# Patient Record
Sex: Male | Born: 1950
Health system: Southern US, Community
[De-identification: ages and names within clinical notes are randomized; demographics above are authoritative.]

## PROBLEM LIST (undated history)

## (undated) DIAGNOSIS — I5022 Chronic systolic (congestive) heart failure: Secondary | ICD-10-CM

## (undated) DIAGNOSIS — G4733 Obstructive sleep apnea (adult) (pediatric): Secondary | ICD-10-CM

## (undated) DIAGNOSIS — E7800 Pure hypercholesterolemia, unspecified: Secondary | ICD-10-CM

## (undated) DIAGNOSIS — M1990 Unspecified osteoarthritis, unspecified site: Secondary | ICD-10-CM

## (undated) DIAGNOSIS — E349 Endocrine disorder, unspecified: Secondary | ICD-10-CM

## (undated) DIAGNOSIS — I10 Essential (primary) hypertension: Secondary | ICD-10-CM

## (undated) DIAGNOSIS — I499 Cardiac arrhythmia, unspecified: Secondary | ICD-10-CM

## (undated) DIAGNOSIS — I482 Chronic atrial fibrillation, unspecified: Secondary | ICD-10-CM

## (undated) HISTORY — PX: TONSILLECTOMY: SUR1361

---
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
## 2002-10-31 ENCOUNTER — Ambulatory Visit (HOSPITAL_COMMUNITY): Admission: RE | Admit: 2002-10-31 | Discharge: 2002-10-31 | Payer: Self-pay | Admitting: Gastroenterology

## 2005-09-19 ENCOUNTER — Encounter: Admission: RE | Admit: 2005-09-19 | Discharge: 2005-09-19 | Payer: Self-pay | Admitting: Specialist

## 2016-08-18 ENCOUNTER — Encounter: Payer: Self-pay | Admitting: Cardiovascular Disease

## 2019-03-04 ENCOUNTER — Ambulatory Visit: Payer: Medicare Other | Attending: Internal Medicine

## 2019-03-04 DIAGNOSIS — Z23 Encounter for immunization: Secondary | ICD-10-CM | POA: Insufficient documentation

## 2019-03-04 NOTE — Progress Notes (Signed)
   Covid-19 Vaccination Clinic  Name:  Frank Carpenter    MRN: [id number] DOB: [birth year]  03/04/2019  Mr. Frank Carpenter was observed post Covid-19 immunization for 15 minutes without incidence. He was provided with Vaccine Information Sheet and instruction to access the V-Safe system.   Mr. Frank Carpenter was instructed to call 911 with any severe reactions post vaccine: Marland Kitchen Difficulty breathing  . Swelling of your face and throat  . A fast heartbeat  . A bad rash all over your body  . Dizziness and weakness    Immunizations Administered    Name Date Dose VIS Date Route   Pfizer COVID-19 Vaccine 03/04/2019  4:18 PM 0.3 mL 01/04/2019 Intramuscular   Manufacturer: [employer], [employer]   Lot: [id number]   [place]: [id number]

## 2019-03-29 ENCOUNTER — Ambulatory Visit: Payer: Medicare Other | Attending: Internal Medicine

## 2019-03-29 DIAGNOSIS — Z23 Encounter for immunization: Secondary | ICD-10-CM

## 2019-03-29 NOTE — Progress Notes (Signed)
   Covid-19 Vaccination Clinic  Name:  NICHOLLAS TESTER    MRN: [id number] DOB: 1950/09/20  03/29/2019  Mr. Kalman Drape was observed post Covid-19 immunization for 15 minutes without incident. He was provided with Vaccine Information Sheet and instruction to access the V-Safe system.   Mr. Kalman Drape was instructed to call 911 with any severe reactions post vaccine: Marland Kitchen Difficulty breathing  . Swelling of face and throat  . A fast heartbeat  . A bad rash all over body  . Dizziness and weakness   Immunizations Administered    Name Date Dose VIS Date Route   Pfizer COVID-19 Vaccine 03/29/2019  3:57 PM 0.3 mL 01/04/2019 Intramuscular   Manufacturer: [place]   Lot: [id number]   [place]: [id number]

## 2019-04-03 ENCOUNTER — Encounter: Payer: Self-pay | Admitting: Cardiovascular Disease

## 2019-05-06 ENCOUNTER — Encounter: Payer: Self-pay | Admitting: Cardiovascular Disease

## 2019-05-31 ENCOUNTER — Encounter: Payer: Self-pay | Admitting: Cardiovascular Disease

## 2019-05-31 ENCOUNTER — Other Ambulatory Visit: Payer: Self-pay

## 2019-05-31 ENCOUNTER — Ambulatory Visit: Payer: Medicare Other | Admitting: Cardiovascular Disease

## 2019-05-31 DIAGNOSIS — R0600 Dyspnea, unspecified: Secondary | ICD-10-CM | POA: Diagnosis not present

## 2019-05-31 DIAGNOSIS — I10 Essential (primary) hypertension: Secondary | ICD-10-CM | POA: Diagnosis not present

## 2019-05-31 DIAGNOSIS — E782 Mixed hyperlipidemia: Secondary | ICD-10-CM

## 2019-05-31 DIAGNOSIS — Z720 Tobacco use: Secondary | ICD-10-CM | POA: Diagnosis not present

## 2019-05-31 DIAGNOSIS — R0609 Other forms of dyspnea: Secondary | ICD-10-CM | POA: Insufficient documentation

## 2019-05-31 DIAGNOSIS — E785 Hyperlipidemia, unspecified: Secondary | ICD-10-CM | POA: Insufficient documentation

## 2019-05-31 NOTE — Patient Instructions (Signed)
Medication Instructions:  NO CHANGE *If you need a refill on your cardiac medications before your next appointment, please call your pharmacy*   Lab Work: If you have labs (blood work) drawn today and your tests are completely normal, you will receive your results only by: Marland Kitchen MyChart Message (if you have MyChart) OR . A paper copy in the mail If you have any lab test that is abnormal or we need to change your treatment, we will call you to review the results.   Testing/Procedures: Your physician has requested that you have an echocardiogram. Echocardiography is a painless test that uses sound waves to create images of your heart. It provides your doctor with information about the size and shape of your heart and how well your heart's chambers and valves are working. This procedure takes approximately one hour. There are no restrictions for this procedure.[place]   Follow-Up: At [facility], you and your health needs are our priority.  As part of our continuing mission to provide you with exceptional heart care, we have created designated Provider Care Teams.  These Care Teams include your primary Cardiologist (physician) and Advanced Practice Providers (APPs -  Physician Assistants and Nurse Practitioners) who all work together to provide you with the care you need, when you need it.  We recommend signing up for the patient portal called "MyChart".  Sign up information is provided on this After Visit Summary.  MyChart is used to connect with patients for Virtual Visits (Telemedicine).  Patients are able to view lab/test results, encounter notes, upcoming appointments, etc.  Non-urgent messages can be sent to your provider as well.   To learn more about what you can do with MyChart, go to [email or web address].    Your next appointment:   4-6 week(s)  The format for your next appointment:   In Person  Provider:    Quay Burow, MD

## 2019-05-31 NOTE — Assessment & Plan Note (Signed)
History of hyperlipidemia intolerant to statin drugs

## 2019-05-31 NOTE — Assessment & Plan Note (Signed)
History of remote tobacco abuse having smoked approximate 20 pack years and stopped 5 or 6 years ago.

## 2019-05-31 NOTE — Assessment & Plan Note (Signed)
History of essential hypertension a blood pressure measured today 140/90.  He is on Benicar

## 2019-05-31 NOTE — Assessment & Plan Note (Signed)
5 to 6 months history of progressive dyspnea on exertion.  He denies chest pain.  I am going to get a 2D echocardiogram and coronary calcium score to further evaluate

## 2019-05-31 NOTE — Progress Notes (Signed)
05/31/2019 Frank Carpenter   09-14-1950  [id number]  Primary Physician Marda Stalker, PA-C Primary Cardiologist: Lorretta Harp MD Lupe Carney, [place]  HPI:  Frank Carpenter is a 69 y.o. moderately overweight married Caucasian male father of 28, grandfather 1 grandchild is retired from working in Press photographer and transportation.  He was referred by Marda Stalker, PA-C for evaluation of progressive dyspnea.  His cardiovascular risk factor profile is notable for discontinue tobacco abuse having smoked 20 pack years and stopped 5 to 6 years ago.  He has treated hypertension, untreated hyperlipidemia because of statin intolerance.  There is no family history.  Is never had an attack or stroke.  He denies chest pain but complains of increasing dyspnea exertion over the last 5 to 6 months.   Current Meds  Medication Sig  . methadone (DOLOPHINE) 10 MG tablet Take 10 mg by mouth every 8 (eight) hours.  Marland Kitchen olmesartan (BENICAR) 40 MG tablet Take 40 mg by mouth in the morning and at bedtime. Take half A TABLET IN THE MORNING AND HALF IN THE EVENNING  . testosterone cypionate (DEPOTESTOSTERONE CYPIONATE) 200 MG/ML injection SMARTSIG:0.5 Milliliter(s) IM Once a Week     No Known Allergies  Social History   Socioeconomic History  . Marital status: Married    Spouse name: Not on file  . Number of children: Not on file  . Years of education: Not on file  . Highest education level: Not on file  Occupational History  . Not on file  Tobacco Use  . Smoking status: Not on file  Substance and Sexual Activity  . Alcohol use: Not on file  . Drug use: Not on file  . Sexual activity: Not on file  Other Topics Concern  . Not on file  Social History Narrative  . Not on file   Social Determinants of Health   Financial Resource Strain:   . Difficulty of Paying Living Expenses:   Food Insecurity:   . Worried About Charity fundraiser in the Last Year:   . Arboriculturist in the Last Year:     Transportation Needs:   . Film/video editor (Medical):   Marland Kitchen Lack of Transportation (Non-Medical):   Physical Activity:   . Days of Exercise per Week:   . Minutes of Exercise per Session:   Stress:   . Feeling of Stress :   Social Connections:   . Frequency of Communication with Friends and Family:   . Frequency of Social Gatherings with Friends and Family:   . Attends Religious Services:   . Active Member of Clubs or Organizations:   . Attends Archivist Meetings:   Marland Kitchen Marital Status:   Intimate Partner Violence:   . Fear of Current or Ex-Partner:   . Emotionally Abused:   Marland Kitchen Physically Abused:   . Sexually Abused:      Review of Systems: General: negative for chills, fever, night sweats or weight changes.  Cardiovascular: negative for chest pain, dyspnea on exertion, edema, orthopnea, palpitations, paroxysmal nocturnal dyspnea or shortness of breath Dermatological: negative for rash Respiratory: negative for cough or wheezing Urologic: negative for hematuria Abdominal: negative for nausea, vomiting, diarrhea, bright red blood per rectum, melena, or hematemesis Neurologic: negative for visual changes, syncope, or dizziness All other systems reviewed and are otherwise negative except as noted above.    Blood pressure 140/90, pulse 96, height 5\' 11"  (1.803 m), weight 234 lb (106.1  kg), SpO2 96 %.  General appearance: alert and no distress Neck: no adenopathy, no carotid bruit, no JVD, supple, symmetrical, trachea midline and thyroid not enlarged, symmetric, no tenderness/mass/nodules Lungs: clear to auscultation bilaterally Heart: regular rate and rhythm, S1, S2 normal, no murmur, click, rub or gallop Extremities: extremities normal, atraumatic, no cyanosis or edema Pulses: 2+ and symmetric Skin: Skin color, texture, turgor normal. No rashes or lesions Neurologic: Alert and oriented X 3, normal strength and tone. Normal symmetric reflexes. Normal coordination and  gait  EKG sinus rhythm at 96 without ST or T wave changes.  Personally reviewed this EKG.  ASSESSMENT AND PLAN:   Tobacco abuse History of remote tobacco abuse having smoked approximate 20 pack years and stopped 5 or 6 years ago.  Essential hypertension History of essential hypertension a blood pressure measured today 140/90.  He is on Benicar  Hyperlipidemia History of hyperlipidemia intolerant to statin drugs  Dyspnea on exertion 5 to 6 months history of progressive dyspnea on exertion.  He denies chest pain.  I am going to get a 2D echocardiogram and coronary calcium score to further evaluate      Lorretta Harp MD [facility], [facility] 05/31/2019 3:24 PM

## 2019-06-26 ENCOUNTER — Other Ambulatory Visit (HOSPITAL_COMMUNITY): Payer: Medicare Other

## 2019-06-26 ENCOUNTER — Other Ambulatory Visit: Payer: Self-pay

## 2019-06-26 ENCOUNTER — Ambulatory Visit
Admission: RE | Admit: 2019-06-26 | Discharge: 2019-06-26 | Disposition: A | Discharge status: Home / Self Care | Payer: Self-pay | Source: Ambulatory Visit | Attending: Cardiovascular Disease | Admitting: Cardiovascular Disease

## 2019-06-26 ENCOUNTER — Ambulatory Visit (HOSPITAL_COMMUNITY): Payer: Medicare Other | Attending: Cardiology

## 2019-06-26 DIAGNOSIS — R0609 Other forms of dyspnea: Secondary | ICD-10-CM

## 2019-06-26 DIAGNOSIS — R0600 Dyspnea, unspecified: Secondary | ICD-10-CM | POA: Diagnosis present

## 2019-06-27 ENCOUNTER — Telehealth: Payer: Self-pay

## 2019-06-27 DIAGNOSIS — I712 Thoracic aortic aneurysm, without rupture, unspecified: Secondary | ICD-10-CM

## 2019-06-27 DIAGNOSIS — R0609 Other forms of dyspnea: Secondary | ICD-10-CM

## 2019-06-27 DIAGNOSIS — I77810 Thoracic aortic ectasia: Secondary | ICD-10-CM

## 2019-06-27 NOTE — Telephone Encounter (Signed)
Spoke to patient echo results given.Advised to repeat in 12 months. 

## 2019-06-27 NOTE — Telephone Encounter (Signed)
Spoke to patient coronary calcium score results given.Advised to repeat chest ct in 1 year.

## 2019-06-28 ENCOUNTER — Other Ambulatory Visit: Payer: Self-pay

## 2019-06-28 ENCOUNTER — Encounter: Payer: Self-pay | Admitting: Cardiovascular Disease

## 2019-06-28 ENCOUNTER — Ambulatory Visit: Payer: Medicare Other | Admitting: Cardiovascular Disease

## 2019-06-28 VITALS — BP 132/80 | HR 88 | Ht 71.0 in | Wt 234.0 lb

## 2019-06-28 DIAGNOSIS — R0600 Dyspnea, unspecified: Secondary | ICD-10-CM

## 2019-06-28 DIAGNOSIS — R0609 Other forms of dyspnea: Secondary | ICD-10-CM

## 2019-06-28 NOTE — Progress Notes (Signed)
Mr. Frank Carpenter returns for follow-up of his noninvasive test.  2D echo was essentially normal except for mild dilatation of his thoracic aorta.  Coronary calcium score was 0 but he did have a ascending thoracic aorta measuring 41 mm.  We will follow this on annual basis.  I reassured him that his symptoms of dyspnea are probably not cardiovascular nature.  He was recently diagnosed with Mnire's disease.  Lorretta Harp, M.D., [place], [facility], [place] [address]. [place], [place]  [address]  [phone] 06/28/2019 11:11 AM

## 2019-06-28 NOTE — Patient Instructions (Signed)
Medication Instructions:  Your physician recommends that you continue on your current medications as directed. Please refer to the Current Medication list given to you today.  *If you need a refill on your cardiac medications before your next appointment, please call your pharmacy*   Lab Work: BMET prior to CT in 1 year  If you have labs (blood work) drawn today and your tests are completely normal, you will receive your results only by: Marland Kitchen MyChart Message (if you have MyChart) OR . A paper copy in the mail If you have any lab test that is abnormal or we need to change your treatment, we will call you to review the results.   Testing/Procedures: CT Chest in 1 year   Follow-Up: At [facility], you and your health needs are our priority.  As part of our continuing mission to provide you with exceptional heart care, we have created designated Provider Care Teams.  These Care Teams include your primary Cardiologist (physician) and Advanced Practice Providers (APPs -  Physician Assistants and Nurse Practitioners) who all work together to provide you with the care you need, when you need it.  We recommend signing up for the patient portal called "MyChart".  Sign up information is provided on this After Visit Summary.  MyChart is used to connect with patients for Virtual Visits (Telemedicine).  Patients are able to view lab/test results, encounter notes, upcoming appointments, etc.  Non-urgent messages can be sent to your provider as well.   To learn more about what you can do with MyChart, go to [email or web address].    Your next appointment:   12 month(s)  The format for your next appointment:   In Person  Provider:   You may see Dr. Gwenlyn Found or one of the following Advanced Practice Providers on your designated Care Team:    Kerin Ransom, PA-C  [place], [place]  Coletta Memos, [place]    Other Instructions

## 2019-07-10 ENCOUNTER — Other Ambulatory Visit: Payer: Self-pay | Admitting: Otolaryngology

## 2019-07-10 DIAGNOSIS — R42 Dizziness and giddiness: Secondary | ICD-10-CM

## 2019-07-10 DIAGNOSIS — H9191 Unspecified hearing loss, right ear: Secondary | ICD-10-CM

## 2019-08-08 ENCOUNTER — Telehealth: Payer: Self-pay | Admitting: Cardiovascular Disease

## 2019-08-08 NOTE — Telephone Encounter (Signed)
Returned call to wife-she states they received a letter from [facility] stating the echo was not covered because they did not receive any medical documentation stating the need for it.    Advised this may be the portion of the echo that is not always completed if not needed but would verify with billing/precert.       She also states she was informed the "copay" was $150 for the ct calcium score but insurance is saying it is 110.  Advised this test is not filed through insurance and is 150 out of pocket for everyone.   Advised would send to billing to call and clarify, she verbalized understanding.

## 2019-08-08 NOTE — Telephone Encounter (Signed)
    Pt's wife called, she said the received declined letter for pt's CT. She said the reason is because we did not give insurance the reason for the test. She would like to speak with a nurse to discuss

## 2019-08-15 ENCOUNTER — Other Ambulatory Visit: Payer: Self-pay

## 2019-08-15 ENCOUNTER — Ambulatory Visit
Admission: RE | Admit: 2019-08-15 | Discharge: 2019-08-15 | Disposition: A | Discharge status: Home / Self Care | Payer: Medicare Other | Source: Ambulatory Visit | Attending: Otolaryngology | Admitting: Otolaryngology

## 2019-08-15 DIAGNOSIS — R42 Dizziness and giddiness: Secondary | ICD-10-CM

## 2019-08-15 DIAGNOSIS — H9191 Unspecified hearing loss, right ear: Secondary | ICD-10-CM

## 2019-08-15 MED ORDER — GADOBENATE DIMEGLUMINE 529 MG/ML IV SOLN
20.0000 mL | Freq: Once | INTRAVENOUS | Status: AC | PRN
  Administered 2019-08-15: 20 mL via INTRAVENOUS

## 2020-03-25 DIAGNOSIS — G4733 Obstructive sleep apnea (adult) (pediatric): Secondary | ICD-10-CM | POA: Diagnosis not present

## 2020-03-27 DIAGNOSIS — E785 Hyperlipidemia, unspecified: Secondary | ICD-10-CM | POA: Diagnosis not present

## 2020-03-27 DIAGNOSIS — E7800 Pure hypercholesterolemia, unspecified: Secondary | ICD-10-CM | POA: Diagnosis not present

## 2020-03-27 DIAGNOSIS — I10 Essential (primary) hypertension: Secondary | ICD-10-CM | POA: Diagnosis not present

## 2020-04-01 DIAGNOSIS — G4731 Primary central sleep apnea: Secondary | ICD-10-CM | POA: Diagnosis not present

## 2020-05-07 ENCOUNTER — Encounter (HOSPITAL_BASED_OUTPATIENT_CLINIC_OR_DEPARTMENT_OTHER): Payer: Self-pay

## 2020-05-07 DIAGNOSIS — G4733 Obstructive sleep apnea (adult) (pediatric): Secondary | ICD-10-CM

## 2020-06-09 ENCOUNTER — Ambulatory Visit (HOSPITAL_BASED_OUTPATIENT_CLINIC_OR_DEPARTMENT_OTHER): Payer: Medicare Other | Attending: Internal Medicine | Admitting: Internal Medicine

## 2020-06-09 ENCOUNTER — Other Ambulatory Visit: Payer: Self-pay

## 2020-06-09 DIAGNOSIS — G4733 Obstructive sleep apnea (adult) (pediatric): Secondary | ICD-10-CM | POA: Insufficient documentation

## 2020-06-11 ENCOUNTER — Other Ambulatory Visit: Payer: Self-pay

## 2020-06-11 ENCOUNTER — Other Ambulatory Visit (HOSPITAL_BASED_OUTPATIENT_CLINIC_OR_DEPARTMENT_OTHER): Payer: Self-pay

## 2020-06-11 DIAGNOSIS — G4733 Obstructive sleep apnea (adult) (pediatric): Secondary | ICD-10-CM

## 2020-06-25 ENCOUNTER — Other Ambulatory Visit: Payer: Self-pay

## 2020-06-25 ENCOUNTER — Ambulatory Visit (HOSPITAL_COMMUNITY): Payer: Medicare Other | Attending: Cardiology

## 2020-06-25 DIAGNOSIS — I77810 Thoracic aortic ectasia: Secondary | ICD-10-CM | POA: Insufficient documentation

## 2020-06-25 DIAGNOSIS — R0600 Dyspnea, unspecified: Secondary | ICD-10-CM | POA: Diagnosis not present

## 2020-06-25 DIAGNOSIS — R0609 Other forms of dyspnea: Secondary | ICD-10-CM

## 2020-06-25 LAB — ECHOCARDIOGRAM COMPLETE
Area-P 1/2: 3.21 cm2
S' Lateral: 2.7 cm

## 2020-06-26 ENCOUNTER — Other Ambulatory Visit: Payer: Self-pay

## 2020-06-26 ENCOUNTER — Telehealth: Payer: Self-pay | Admitting: Cardiovascular Disease

## 2020-06-26 ENCOUNTER — Other Ambulatory Visit: Payer: Self-pay | Admitting: *Deleted

## 2020-06-26 DIAGNOSIS — I712 Thoracic aortic aneurysm, without rupture, unspecified: Secondary | ICD-10-CM

## 2020-06-26 NOTE — Telephone Encounter (Signed)
Spoke with patient regarding the Wednesday 08/05/20 11:00 am-CTA chest/aorta appointment at [facility]. [place], [address]---arrival time is 10:45 am for check in--liquids only 4 hours prior to study---patient to come in Friday 07/31/20 or Monday 08/03/20 for lab work.  WIll mail information to patient and he voiced his understanding.

## 2020-07-05 NOTE — Procedures (Signed)
   NAME: Frank Carpenter DATE OF BIRTH:  1950-05-11 MEDICAL RECORD NUMBER [id number]  LOCATION: [facility]  PHYSICIAN: Marius Ditch  DATE OF STUDY: 06/09/2020  SLEEP STUDY TYPE: Positive Airway Pressure Titration               REFERRING PHYSICIAN: Marius Ditch, MD  EPWORTH SLEEPINESS SCORE:  NA HEIGHT: 6' (182.9 cm)  WEIGHT: 230 lb (104.3 kg)    Body mass index is 31.19 kg/m.  NECK SIZE: 17 in.  CLINICAL INFORMATION The patient was referred to the sleep center for CPAP/BPAP titration due to central apnea emerging in the treatment of obstructive sleep apnea. The patient is on chronic opioid therapy.   MEDICATIONS No sleep medicine administered.Marland Kitchen  SLEEP STUDY TECHNIQUE The patient underwent an attended overnight polysomnography titration to assess the effects of cpap therapy. The following variables were monitored: EEG (C4-A1, C3-A2, O1-A2, O2-A1, F3-M2, F4-M1), EOG, submental and leg EMG, ECG, oxyhemoglobin saturation by pulse oximetry, thoracic and abdominal respiratory effort belts, nasal/oral airflow by pressure sensor, body position sensor and snoring sensor. CPAP pressure was titrated to eliminate apneas, hypopneas and oxygen desaturation.  TECHNICAL COMMENTS Comments added by Technician: PATIENT WAS ORDERED AS A CPAP / BIPAP TITRATION. Comments added by Scorer: N/A  SLEEP ARCHITECTURE The study was initiated at 11:04:52 PM and terminated at 5:46:14 AM. Total recorded time was 401.4 minutes. EEG confirmed total sleep time was 293 minutes yielding a sleep efficiency of 73.0%%. Sleep onset after lights out was 8.6 minutes with a REM latency of 279.0 minutes. The patient spent 16.6%% of the night in stage N1 sleep, 81.7%% in stage N2 sleep, 0.0%% in stage N3 and 1.7% in REM. The Arousal Index was 36.0/hour.  RESPIRATORY PARAMETERS The overall AHI was 21.3 per hour, and the RDI was 40.1 events/hour with a central apnea index of 8.8per hour. The most  appropriate setting of BiPAP was IPAP/EPAP 28/24 cm H2O. At this setting, the sleep efficiency was 98 % and the patient was supine for 100%. The AHI was 0 events per hour, and the RDI was 31.3 events/hour at BPAP 28/24. The oxygen nadir was 91.0% during sleep at this setting.   LEG MOVEMENT DATA The total leg movements were 7 with a resulting leg movement index of 1.4. Associated arousal with leg movement index was 0.0.  CARDIAC DATA The underlying cardiac rhythm was most consistent with sinus rhythm. Mean heart rate during sleep was 65.2 bpm. Additional rhythm abnormalities include PVCs.  IMPRESSIONS - Complex sleep apnea. Optimal pressure not obtained.   DIAGNOSIS - Obstructive Sleep Apnea (G47.33) - Complex sleep apnea.   RECOMMENDATIONS - The patient should return for an ASV titration.   Marius Ditch Sleep specialist, [place] Board of Internal Medicine  ELECTRONICALLY SIGNED ON:  07/05/2020, 2:16 PM [place] PH: [phone]   FX: [phone] [place]

## 2020-07-30 ENCOUNTER — Other Ambulatory Visit: Payer: Self-pay

## 2020-07-30 ENCOUNTER — Other Ambulatory Visit: Payer: Medicare Other

## 2020-07-30 DIAGNOSIS — I712 Thoracic aortic aneurysm, without rupture, unspecified: Secondary | ICD-10-CM

## 2020-07-30 LAB — BASIC METABOLIC PANEL
BUN/Creatinine Ratio: 14 (ref 10–24)
BUN: 17 mg/dL (ref 8–27)
CO2: 26 mmol/L (ref 20–29)
Calcium: 9.7 mg/dL (ref 8.6–10.2)
Chloride: 95 mmol/L — ABNORMAL LOW (ref 96–106)
Creatinine, Ser: 1.23 mg/dL (ref 0.76–1.27)
Glucose: 105 mg/dL — ABNORMAL HIGH (ref 65–99)
Potassium: 4.2 mmol/L (ref 3.5–5.2)
Sodium: 137 mmol/L (ref 134–144)
eGFR: 63 mL/min/{1.73_m2} (ref >59)

## 2020-07-31 ENCOUNTER — Other Ambulatory Visit: Payer: Medicare Other

## 2020-08-05 ENCOUNTER — Other Ambulatory Visit: Payer: Self-pay

## 2020-08-05 ENCOUNTER — Ambulatory Visit (INDEPENDENT_AMBULATORY_CARE_PROVIDER_SITE_OTHER)
Admission: RE | Admit: 2020-08-05 | Discharge: 2020-08-05 | Disposition: A | Discharge status: Home / Self Care | Payer: Medicare Other | Source: Ambulatory Visit | Attending: Cardiovascular Disease | Admitting: Cardiovascular Disease

## 2020-08-05 ENCOUNTER — Other Ambulatory Visit: Payer: Medicare Other

## 2020-08-05 DIAGNOSIS — I712 Thoracic aortic aneurysm, without rupture, unspecified: Secondary | ICD-10-CM

## 2020-08-05 DIAGNOSIS — N281 Cyst of kidney, acquired: Secondary | ICD-10-CM | POA: Diagnosis not present

## 2020-08-05 MED ORDER — IOHEXOL 350 MG/ML SOLN
100.0000 mL | Freq: Once | INTRAVENOUS | Status: AC | PRN
  Administered 2020-08-05: 100 mL via INTRAVENOUS

## 2020-08-06 ENCOUNTER — Encounter: Payer: Self-pay | Admitting: *Deleted

## 2020-08-17 ENCOUNTER — Other Ambulatory Visit: Payer: Self-pay

## 2020-08-17 DIAGNOSIS — I712 Thoracic aortic aneurysm, without rupture, unspecified: Secondary | ICD-10-CM

## 2020-10-13 DIAGNOSIS — Z23 Encounter for immunization: Secondary | ICD-10-CM | POA: Diagnosis not present

## 2020-12-10 DIAGNOSIS — K5909 Other constipation: Secondary | ICD-10-CM | POA: Diagnosis not present

## 2020-12-22 DIAGNOSIS — E7800 Pure hypercholesterolemia, unspecified: Secondary | ICD-10-CM | POA: Diagnosis not present

## 2020-12-22 DIAGNOSIS — E785 Hyperlipidemia, unspecified: Secondary | ICD-10-CM | POA: Diagnosis not present

## 2020-12-22 DIAGNOSIS — I10 Essential (primary) hypertension: Secondary | ICD-10-CM | POA: Diagnosis not present

## 2020-12-24 DIAGNOSIS — Z8709 Personal history of other diseases of the respiratory system: Secondary | ICD-10-CM | POA: Diagnosis not present

## 2020-12-24 DIAGNOSIS — I10 Essential (primary) hypertension: Secondary | ICD-10-CM | POA: Diagnosis not present

## 2020-12-24 DIAGNOSIS — Z23 Encounter for immunization: Secondary | ICD-10-CM | POA: Diagnosis not present

## 2020-12-24 DIAGNOSIS — Z0000 Encounter for general adult medical examination without abnormal findings: Secondary | ICD-10-CM | POA: Diagnosis not present

## 2020-12-24 DIAGNOSIS — E7800 Pure hypercholesterolemia, unspecified: Secondary | ICD-10-CM | POA: Diagnosis not present

## 2020-12-24 DIAGNOSIS — R7303 Prediabetes: Secondary | ICD-10-CM | POA: Diagnosis not present

## 2020-12-30 DIAGNOSIS — E7800 Pure hypercholesterolemia, unspecified: Secondary | ICD-10-CM | POA: Diagnosis not present

## 2020-12-30 DIAGNOSIS — I10 Essential (primary) hypertension: Secondary | ICD-10-CM | POA: Diagnosis not present

## 2021-02-11 DIAGNOSIS — I10 Essential (primary) hypertension: Secondary | ICD-10-CM | POA: Diagnosis not present

## 2021-02-11 DIAGNOSIS — E7800 Pure hypercholesterolemia, unspecified: Secondary | ICD-10-CM | POA: Diagnosis not present

## 2021-03-15 DIAGNOSIS — E7800 Pure hypercholesterolemia, unspecified: Secondary | ICD-10-CM | POA: Diagnosis not present

## 2021-03-15 DIAGNOSIS — I10 Essential (primary) hypertension: Secondary | ICD-10-CM | POA: Diagnosis not present

## 2021-03-15 DIAGNOSIS — E785 Hyperlipidemia, unspecified: Secondary | ICD-10-CM | POA: Diagnosis not present

## 2021-03-17 DIAGNOSIS — D128 Benign neoplasm of rectum: Secondary | ICD-10-CM | POA: Diagnosis not present

## 2021-03-17 DIAGNOSIS — Z86010 Personal history of colonic polyps: Secondary | ICD-10-CM | POA: Diagnosis not present

## 2021-03-17 DIAGNOSIS — D122 Benign neoplasm of ascending colon: Secondary | ICD-10-CM | POA: Diagnosis not present

## 2021-03-17 DIAGNOSIS — K5939 Other megacolon: Secondary | ICD-10-CM | POA: Diagnosis not present

## 2021-03-17 DIAGNOSIS — D124 Benign neoplasm of descending colon: Secondary | ICD-10-CM | POA: Diagnosis not present

## 2021-03-17 DIAGNOSIS — D123 Benign neoplasm of transverse colon: Secondary | ICD-10-CM | POA: Diagnosis not present

## 2021-03-17 DIAGNOSIS — K5730 Diverticulosis of large intestine without perforation or abscess without bleeding: Secondary | ICD-10-CM | POA: Diagnosis not present

## 2021-03-17 DIAGNOSIS — K648 Other hemorrhoids: Secondary | ICD-10-CM | POA: Diagnosis not present

## 2021-03-19 DIAGNOSIS — D128 Benign neoplasm of rectum: Secondary | ICD-10-CM | POA: Diagnosis not present

## 2021-03-19 DIAGNOSIS — D124 Benign neoplasm of descending colon: Secondary | ICD-10-CM | POA: Diagnosis not present

## 2021-03-19 DIAGNOSIS — D123 Benign neoplasm of transverse colon: Secondary | ICD-10-CM | POA: Diagnosis not present

## 2021-03-19 DIAGNOSIS — D122 Benign neoplasm of ascending colon: Secondary | ICD-10-CM | POA: Diagnosis not present

## 2021-04-24 HISTORY — PX: NASAL SEPTUM SURGERY: SHX37

## 2021-04-27 DIAGNOSIS — E7800 Pure hypercholesterolemia, unspecified: Secondary | ICD-10-CM | POA: Diagnosis not present

## 2021-04-27 DIAGNOSIS — T485X5A Adverse effect of other anti-common-cold drugs, initial encounter: Secondary | ICD-10-CM | POA: Diagnosis not present

## 2021-04-27 DIAGNOSIS — G4733 Obstructive sleep apnea (adult) (pediatric): Secondary | ICD-10-CM | POA: Diagnosis not present

## 2021-04-27 DIAGNOSIS — J310 Chronic rhinitis: Secondary | ICD-10-CM | POA: Diagnosis not present

## 2021-04-27 DIAGNOSIS — K219 Gastro-esophageal reflux disease without esophagitis: Secondary | ICD-10-CM | POA: Diagnosis not present

## 2021-04-27 DIAGNOSIS — H6981 Other specified disorders of Eustachian tube, right ear: Secondary | ICD-10-CM | POA: Diagnosis not present

## 2021-04-27 DIAGNOSIS — J342 Deviated nasal septum: Secondary | ICD-10-CM | POA: Diagnosis not present

## 2021-04-28 DIAGNOSIS — J22 Unspecified acute lower respiratory infection: Secondary | ICD-10-CM | POA: Diagnosis not present

## 2021-05-10 DIAGNOSIS — G4733 Obstructive sleep apnea (adult) (pediatric): Secondary | ICD-10-CM | POA: Diagnosis not present

## 2021-05-10 DIAGNOSIS — G4731 Primary central sleep apnea: Secondary | ICD-10-CM | POA: Diagnosis not present

## 2021-05-13 DIAGNOSIS — R899 Unspecified abnormal finding in specimens from other organs, systems and tissues: Secondary | ICD-10-CM | POA: Diagnosis not present

## 2021-05-26 DIAGNOSIS — J328 Other chronic sinusitis: Secondary | ICD-10-CM | POA: Diagnosis not present

## 2021-05-26 DIAGNOSIS — I10 Essential (primary) hypertension: Secondary | ICD-10-CM | POA: Diagnosis not present

## 2021-06-08 ENCOUNTER — Ambulatory Visit (HOSPITAL_COMMUNITY): Payer: Medicare Other | Attending: Internal Medicine

## 2021-06-11 ENCOUNTER — Ambulatory Visit: Payer: Medicare Other | Admitting: Cardiovascular Disease

## 2021-06-24 DIAGNOSIS — J342 Deviated nasal septum: Secondary | ICD-10-CM | POA: Diagnosis not present

## 2021-06-24 DIAGNOSIS — J343 Hypertrophy of nasal turbinates: Secondary | ICD-10-CM | POA: Diagnosis not present

## 2021-06-24 DIAGNOSIS — K219 Gastro-esophageal reflux disease without esophagitis: Secondary | ICD-10-CM | POA: Diagnosis not present

## 2021-06-25 ENCOUNTER — Ambulatory Visit (HOSPITAL_COMMUNITY): Payer: Medicare Other | Attending: Cardiology

## 2021-06-25 DIAGNOSIS — I712 Thoracic aortic aneurysm, without rupture, unspecified: Secondary | ICD-10-CM | POA: Diagnosis not present

## 2021-06-25 LAB — ECHOCARDIOGRAM COMPLETE
Area-P 1/2: 3.11 cm2
S' Lateral: 3.1 cm

## 2021-06-30 ENCOUNTER — Encounter: Payer: Self-pay | Admitting: Cardiovascular Disease

## 2021-06-30 ENCOUNTER — Ambulatory Visit (INDEPENDENT_AMBULATORY_CARE_PROVIDER_SITE_OTHER): Payer: Medicare Other | Admitting: Cardiovascular Disease

## 2021-06-30 DIAGNOSIS — E782 Mixed hyperlipidemia: Secondary | ICD-10-CM

## 2021-06-30 DIAGNOSIS — I7121 Aneurysm of the ascending aorta, without rupture: Secondary | ICD-10-CM

## 2021-06-30 DIAGNOSIS — I10 Essential (primary) hypertension: Secondary | ICD-10-CM | POA: Diagnosis not present

## 2021-06-30 DIAGNOSIS — R0609 Other forms of dyspnea: Secondary | ICD-10-CM

## 2021-06-30 DIAGNOSIS — I712 Thoracic aortic aneurysm, without rupture, unspecified: Secondary | ICD-10-CM | POA: Insufficient documentation

## 2021-06-30 NOTE — Assessment & Plan Note (Signed)
History of hyperlipidemia on Zetia with lipid profile performed//23 revealing total cholesterol 47, LDL 93 and HDL 37.

## 2021-06-30 NOTE — Assessment & Plan Note (Signed)
Small thoracic aortic aneurysm measuring 42 mm by 2D echo recently performed 06/25/2021.  This will be repeated every other year.

## 2021-06-30 NOTE — Progress Notes (Signed)
06/30/2021 Frank Carpenter   [birth year]  [id number]  Primary Physician Marda Stalker, PA-C Primary Cardiologist: Lorretta Harp MD Lupe Carney, [place]  HPI:  Frank Carpenter is a 71 y.o. moderately overweight married Caucasian male father of 4, grandfather 1 grandchild is retired from working in Press photographer and transportation.  He was referred by Marda Stalker, PA-C for evaluation of progressive dyspnea.  I last saw him in the office 05/31/2019.  His cardiovascular risk factor profile is notable for discontinue tobacco abuse having smoked 20 pack years and stopped 5 to 6 years ago.  He has treated hypertension, untreated hyperlipidemia because of statin intolerance.  There is no family history.  Is never had an attack or stroke.  He denies chest pain but complains of increasing dyspnea exertion over the last 5 to 6 months.  Since I saw him 2 years ago he continues to do well.  I got a coronary calcium score on him 06/26/2019 which was 0 and a 2D echocardiogram 06/25/2021 which showed normal LV systolic function, grade 1 diastolic dysfunction and a small ascending thoracic aortic aneurysm measuring 42 mm.  He had no valvular abnormalities.   Current Meds  Medication Sig   [employer] 12 HOUR NASAL DECONGESTANT 120 MG 12 hr tablet Take 120 mg by mouth as needed.   ezetimibe (ZETIA) 10 MG tablet Take 10 mg by mouth daily.   fluticasone (FLONASE) 50 MCG/ACT nasal spray Place 1 spray into both nostrils as needed.   hydrOXYzine (ATARAX) 25 MG tablet Take 25 mg by mouth every 8 (eight) hours as needed.   methadone (DOLOPHINE) 10 MG tablet Take 10 mg by mouth every 8 (eight) hours.   Olmesartan-amLODIPine-HCTZ 40-5-12.5 MG TABS Take 1 tablet by mouth daily.   omeprazole (PRILOSEC) 40 MG capsule Take by mouth as needed.   testosterone cypionate (DEPOTESTOSTERONE CYPIONATE) 200 MG/ML injection SMARTSIG:0.5 Milliliter(s) IM Once a Week     Allergies  Allergen Reactions   Statins     Other reaction(s):  Other (See Comments) Fatigue    Social History   Socioeconomic History   Marital status: Married    Spouse name: Not on file   Number of children: Not on file   Years of education: Not on file   Highest education level: Not on file  Occupational History   Not on file  Tobacco Use   Smoking status: Never   Smokeless tobacco: Never  Substance and Sexual Activity   Alcohol use: Yes    Alcohol/week: 2.0 standard drinks    Types: 2 Cans of beer per week   Drug use: Yes    Types: Hydrocodone   Sexual activity: Not on file  Other Topics Concern   Not on file  Social History Narrative   Not on file   Social Determinants of Health   Financial Resource Strain: Not on file  Food Insecurity: Not on file  Transportation Needs: Not on file  Physical Activity: Not on file  Stress: Not on file  Social Connections: Not on file  Intimate Partner Violence: Not on file     Review of Systems: General: negative for chills, fever, night sweats or weight changes.  Cardiovascular: negative for chest pain, dyspnea on exertion, edema, orthopnea, palpitations, paroxysmal nocturnal dyspnea or shortness of breath Dermatological: negative for rash Respiratory: negative for cough or wheezing Urologic: negative for hematuria Abdominal: negative for nausea, vomiting, diarrhea, bright red blood per rectum, melena, or hematemesis Neurologic: negative for visual  changes, syncope, or dizziness All other systems reviewed and are otherwise negative except as noted above.    Blood pressure 124/75, pulse 90, height '5\' 11"'$  (1.803 m), weight 231 lb 9.6 oz (105.1 kg), SpO2 97 %.  General appearance: alert and no distress Neck: no adenopathy, no carotid bruit, no JVD, supple, symmetrical, trachea midline, and thyroid not enlarged, symmetric, no tenderness/mass/nodules Lungs: clear to auscultation bilaterally Heart: regular rate and rhythm, S1, S2 normal, no murmur, click, rub or gallop Extremities:  extremities normal, atraumatic, no cyanosis or edema Pulses: 2+ and symmetric Skin: Skin color, texture, turgor normal. No rashes or lesions Neurologic: Grossly normal  EKG sinus rhythm at 90 with occasional PVCs and nonspecific ST and T wave changes.  I personally reviewed this EKG.  ASSESSMENT AND PLAN:   Essential hypertension History of essential hypertension a blood pressure measured today at 124/75.  He is on olmesartan and amlodipine as well as hydrochlorothiazide.  Hyperlipidemia History of hyperlipidemia on Zetia with lipid profile performed//23 revealing total cholesterol 47, LDL 93 and HDL 37.  Dyspnea on exertion History of dyspnea on exertion probably primarily pulmonary in origin from prior tobacco abuse.  He did have a coronary calcium score which was 0 and a 2D echo which was essentially normal as well.  Thoracic aortic aneurysm (HCC) Small thoracic aortic aneurysm measuring 42 mm by 2D echo recently performed 06/25/2021.  This will be repeated every other year.     Lorretta Harp MD FACP,FACC,FAHA, [facility] 06/30/2021 11:57 AM

## 2021-06-30 NOTE — Assessment & Plan Note (Signed)
History of dyspnea on exertion probably primarily pulmonary in origin from prior tobacco abuse.  He did have a coronary calcium score which was 0 and a 2D echo which was essentially normal as well.

## 2021-06-30 NOTE — Assessment & Plan Note (Signed)
History of essential hypertension a blood pressure measured today at 124/75.  He is on olmesartan and amlodipine as well as hydrochlorothiazide.

## 2021-06-30 NOTE — Patient Instructions (Signed)
Medication Instructions:  Your physician recommends that you continue on your current medications as directed. Please refer to the Current Medication list given to you today.  *If you need a refill on your cardiac medications before your next appointment, please call your pharmacy*   Testing/Procedures: Your physician has requested that you have an echocardiogram. Echocardiography is a painless test that uses sound waves to create images of your heart. It provides your doctor with information about the size and shape of your heart and how well your heart's chambers and valves are working. This procedure takes approximately one hour. There are no restrictions for this procedure. To be done in June 2025.     Follow-Up: At [facility], you and your health needs are our priority.  As part of our continuing mission to provide you with exceptional heart care, we have created designated Provider Care Teams.  These Care Teams include your primary Cardiologist (physician) and Advanced Practice Providers (APPs -  Physician Assistants and Nurse Practitioners) who all work together to provide you with the care you need, when you need it.  We recommend signing up for the patient portal called "MyChart".  Sign up information is provided on this After Visit Summary.  MyChart is used to connect with patients for Virtual Visits (Telemedicine).  Patients are able to view lab/test results, encounter notes, upcoming appointments, etc.  Non-urgent messages can be sent to your provider as well.   To learn more about what you can do with MyChart, go to [email or web address].    Your next appointment:   2 year(s)  The format for your next appointment:   In Person  Provider:   Quay Burow, MD

## 2021-07-20 DIAGNOSIS — J3489 Other specified disorders of nose and nasal sinuses: Secondary | ICD-10-CM | POA: Diagnosis not present

## 2021-07-20 DIAGNOSIS — J343 Hypertrophy of nasal turbinates: Secondary | ICD-10-CM | POA: Diagnosis not present

## 2021-10-15 ENCOUNTER — Other Ambulatory Visit: Payer: Self-pay | Admitting: Family Medicine

## 2021-10-15 DIAGNOSIS — M545 Low back pain, unspecified: Secondary | ICD-10-CM

## 2021-10-21 DIAGNOSIS — M5459 Other low back pain: Secondary | ICD-10-CM | POA: Diagnosis not present

## 2021-10-21 DIAGNOSIS — I10 Essential (primary) hypertension: Secondary | ICD-10-CM | POA: Diagnosis not present

## 2021-11-03 ENCOUNTER — Ambulatory Visit
Admission: RE | Admit: 2021-11-03 | Discharge: 2021-11-03 | Disposition: A | Discharge status: Home / Self Care | Payer: Medicare Other | Source: Ambulatory Visit | Attending: Family Medicine | Admitting: Family Medicine

## 2021-11-03 DIAGNOSIS — M4317 Spondylolisthesis, lumbosacral region: Secondary | ICD-10-CM | POA: Diagnosis not present

## 2021-11-03 DIAGNOSIS — M545 Low back pain, unspecified: Secondary | ICD-10-CM | POA: Diagnosis not present

## 2021-11-03 DIAGNOSIS — M48061 Spinal stenosis, lumbar region without neurogenic claudication: Secondary | ICD-10-CM | POA: Diagnosis not present

## 2021-11-03 DIAGNOSIS — M4126 Other idiopathic scoliosis, lumbar region: Secondary | ICD-10-CM | POA: Diagnosis not present

## 2021-11-03 DIAGNOSIS — M79604 Pain in right leg: Secondary | ICD-10-CM | POA: Diagnosis not present

## 2021-11-03 DIAGNOSIS — M4316 Spondylolisthesis, lumbar region: Secondary | ICD-10-CM | POA: Diagnosis not present

## 2021-12-03 DIAGNOSIS — E7800 Pure hypercholesterolemia, unspecified: Secondary | ICD-10-CM | POA: Diagnosis not present

## 2021-12-03 DIAGNOSIS — R7303 Prediabetes: Secondary | ICD-10-CM | POA: Diagnosis not present

## 2022-01-05 DIAGNOSIS — Z0000 Encounter for general adult medical examination without abnormal findings: Secondary | ICD-10-CM | POA: Diagnosis not present

## 2022-01-10 DIAGNOSIS — I4891 Unspecified atrial fibrillation: Secondary | ICD-10-CM | POA: Diagnosis not present

## 2022-01-10 DIAGNOSIS — R079 Chest pain, unspecified: Secondary | ICD-10-CM | POA: Diagnosis not present

## 2022-01-11 ENCOUNTER — Other Ambulatory Visit: Payer: Self-pay | Admitting: Sports Medicine

## 2022-01-11 DIAGNOSIS — M25512 Pain in left shoulder: Secondary | ICD-10-CM | POA: Diagnosis not present

## 2022-01-13 DIAGNOSIS — M25512 Pain in left shoulder: Secondary | ICD-10-CM | POA: Diagnosis not present

## 2022-01-13 DIAGNOSIS — M6281 Muscle weakness (generalized): Secondary | ICD-10-CM | POA: Diagnosis not present

## 2022-01-13 DIAGNOSIS — M7542 Impingement syndrome of left shoulder: Secondary | ICD-10-CM | POA: Diagnosis not present

## 2022-01-13 DIAGNOSIS — R293 Abnormal posture: Secondary | ICD-10-CM | POA: Diagnosis not present

## 2022-01-14 ENCOUNTER — Telehealth: Payer: Self-pay | Admitting: Cardiovascular Disease

## 2022-01-14 ENCOUNTER — Emergency Department (HOSPITAL_BASED_OUTPATIENT_CLINIC_OR_DEPARTMENT_OTHER): Payer: Medicare Other

## 2022-01-14 ENCOUNTER — Other Ambulatory Visit: Payer: Self-pay

## 2022-01-14 ENCOUNTER — Ambulatory Visit
Admission: RE | Admit: 2022-01-14 | Discharge: 2022-01-14 | Disposition: A | Discharge status: Home / Self Care | Payer: Medicare Other | Source: Ambulatory Visit | Attending: Sports Medicine | Admitting: Sports Medicine

## 2022-01-14 ENCOUNTER — Encounter (HOSPITAL_BASED_OUTPATIENT_CLINIC_OR_DEPARTMENT_OTHER): Payer: Self-pay

## 2022-01-14 ENCOUNTER — Inpatient Hospital Stay (HOSPITAL_BASED_OUTPATIENT_CLINIC_OR_DEPARTMENT_OTHER)
Admission: EM | Admit: 2022-01-14 | Discharge: 2022-01-17 | DRG: 309 | Disposition: A | Discharge status: Home / Self Care | Payer: Medicare Other | Attending: Family Medicine | Admitting: Family Medicine

## 2022-01-14 DIAGNOSIS — I714 Abdominal aortic aneurysm, without rupture, unspecified: Secondary | ICD-10-CM | POA: Diagnosis present

## 2022-01-14 DIAGNOSIS — Z888 Allergy status to other drugs, medicaments and biological substances status: Secondary | ICD-10-CM

## 2022-01-14 DIAGNOSIS — Z79899 Other long term (current) drug therapy: Secondary | ICD-10-CM

## 2022-01-14 DIAGNOSIS — I712 Thoracic aortic aneurysm, without rupture, unspecified: Secondary | ICD-10-CM | POA: Diagnosis not present

## 2022-01-14 DIAGNOSIS — E291 Testicular hypofunction: Secondary | ICD-10-CM | POA: Diagnosis present

## 2022-01-14 DIAGNOSIS — Z1152 Encounter for screening for COVID-19: Secondary | ICD-10-CM

## 2022-01-14 DIAGNOSIS — I4891 Unspecified atrial fibrillation: Secondary | ICD-10-CM | POA: Diagnosis not present

## 2022-01-14 DIAGNOSIS — M25512 Pain in left shoulder: Secondary | ICD-10-CM

## 2022-01-14 DIAGNOSIS — S2231XA Fracture of one rib, right side, initial encounter for closed fracture: Secondary | ICD-10-CM | POA: Diagnosis not present

## 2022-01-14 DIAGNOSIS — G8929 Other chronic pain: Secondary | ICD-10-CM | POA: Diagnosis not present

## 2022-01-14 DIAGNOSIS — I5032 Chronic diastolic (congestive) heart failure: Secondary | ICD-10-CM | POA: Diagnosis not present

## 2022-01-14 DIAGNOSIS — I110 Hypertensive heart disease with heart failure: Secondary | ICD-10-CM | POA: Diagnosis not present

## 2022-01-14 DIAGNOSIS — I10 Essential (primary) hypertension: Secondary | ICD-10-CM | POA: Diagnosis present

## 2022-01-14 DIAGNOSIS — Z6832 Body mass index (BMI) 32.0-32.9, adult: Secondary | ICD-10-CM | POA: Diagnosis not present

## 2022-01-14 DIAGNOSIS — G4733 Obstructive sleep apnea (adult) (pediatric): Secondary | ICD-10-CM | POA: Diagnosis not present

## 2022-01-14 DIAGNOSIS — I480 Paroxysmal atrial fibrillation: Secondary | ICD-10-CM | POA: Diagnosis not present

## 2022-01-14 DIAGNOSIS — Z79890 Hormone replacement therapy (postmenopausal): Secondary | ICD-10-CM

## 2022-01-14 DIAGNOSIS — M19012 Primary osteoarthritis, left shoulder: Secondary | ICD-10-CM | POA: Diagnosis not present

## 2022-01-14 DIAGNOSIS — R0602 Shortness of breath: Secondary | ICD-10-CM | POA: Diagnosis not present

## 2022-01-14 DIAGNOSIS — Z8249 Family history of ischemic heart disease and other diseases of the circulatory system: Secondary | ICD-10-CM | POA: Diagnosis not present

## 2022-01-14 DIAGNOSIS — Z8052 Family history of malignant neoplasm of bladder: Secondary | ICD-10-CM | POA: Diagnosis not present

## 2022-01-14 DIAGNOSIS — I7121 Aneurysm of the ascending aorta, without rupture: Secondary | ICD-10-CM | POA: Diagnosis not present

## 2022-01-14 DIAGNOSIS — D72829 Elevated white blood cell count, unspecified: Secondary | ICD-10-CM | POA: Diagnosis present

## 2022-01-14 DIAGNOSIS — E7800 Pure hypercholesterolemia, unspecified: Secondary | ICD-10-CM | POA: Diagnosis not present

## 2022-01-14 DIAGNOSIS — E669 Obesity, unspecified: Secondary | ICD-10-CM | POA: Diagnosis present

## 2022-01-14 DIAGNOSIS — R079 Chest pain, unspecified: Secondary | ICD-10-CM | POA: Diagnosis not present

## 2022-01-14 DIAGNOSIS — J9811 Atelectasis: Secondary | ICD-10-CM | POA: Diagnosis not present

## 2022-01-14 HISTORY — DX: Endocrine disorder, unspecified: E34.9

## 2022-01-14 HISTORY — DX: Pure hypercholesterolemia, unspecified: E78.00

## 2022-01-14 LAB — CBC
HCT: 42.6 % (ref 39.0–52.0)
Hemoglobin: 14.7 g/dL (ref 13.0–17.0)
MCH: 32 pg (ref 26.0–34.0)
MCHC: 34.5 g/dL (ref 30.0–36.0)
MCV: 92.8 fL (ref 80.0–100.0)
Platelets: 290 10*3/uL (ref 150–400)
RBC: 4.59 MIL/uL (ref 4.22–5.81)
RDW: 12.5 % (ref 11.5–15.5)
WBC: 12.6 10*3/uL — ABNORMAL HIGH (ref 4.0–10.5)
nRBC: 0 % (ref 0.0–0.2)

## 2022-01-14 LAB — BASIC METABOLIC PANEL
Anion gap: 11 (ref 5–15)
BUN: 24 mg/dL — ABNORMAL HIGH (ref 8–23)
CO2: 25 mmol/L (ref 22–32)
Calcium: 9.2 mg/dL (ref 8.9–10.3)
Chloride: 99 mmol/L (ref 98–111)
Creatinine, Ser: 1.01 mg/dL (ref 0.61–1.24)
GFR, Estimated: >60 mL/min (ref >60)
Glucose, Bld: 102 mg/dL — ABNORMAL HIGH (ref 70–99)
Potassium: 4 mmol/L (ref 3.5–5.1)
Sodium: 135 mmol/L (ref 135–145)

## 2022-01-14 LAB — TROPONIN I (HIGH SENSITIVITY): Troponin I (High Sensitivity): 6 ng/L (ref <18)

## 2022-01-14 MED ORDER — DILTIAZEM LOAD VIA INFUSION
10.0000 mg | Freq: Once | INTRAVENOUS | Status: AC
  Administered 2022-01-14: 10 mg via INTRAVENOUS
  Filled 2022-01-14: qty 10

## 2022-01-14 MED ORDER — IOHEXOL 350 MG/ML SOLN
90.0000 mL | Freq: Once | INTRAVENOUS | Status: AC | PRN
  Administered 2022-01-14: 90 mL via INTRAVENOUS

## 2022-01-14 MED ORDER — DILTIAZEM HCL-DEXTROSE 125-5 MG/125ML-% IV SOLN (PREMIX)
5.0000 mg/h | INTRAVENOUS | Status: DC
  Administered 2022-01-14: 5 mg/h via INTRAVENOUS
  Administered 2022-01-15 – 2022-01-16 (×2): 7.5 mg/h via INTRAVENOUS
  Administered 2022-01-16: 5 mg/h via INTRAVENOUS
  Filled 2022-01-14 (×4): qty 125

## 2022-01-14 NOTE — Telephone Encounter (Signed)
Patient called and mentioned that doctor told him that he was in afib. Did not know that he was in afib. Wants to talk with Dr. Gwenlyn Found or nurse to see what they think and what to do next

## 2022-01-14 NOTE — Telephone Encounter (Signed)
Spoke with patient and informed him of C. Walker's recommendation for him to go to [facility] or [facility] ED to be evaluated and treated if in afib. Patient stated he will go. The urgent care he went to was [facility] on [address].

## 2022-01-14 NOTE — ED Triage Notes (Addendum)
Pt presents with a CP that started on 12/18 with radiation to the L shoulder. Pt has some associated ShOB and nausea. Pt states the pain in the chest eased off and now he only hurts in his shoulder. Pt states the pain is worse with inspiration. Pt also reports that he has had some R leg pain and swelling. Pt is not on anticoagulants.   Pt showing a-fib on the EKG. Pt denies hx of previous a-fib.

## 2022-01-14 NOTE — Telephone Encounter (Signed)
See other note

## 2022-01-14 NOTE — Telephone Encounter (Signed)
Patient stated he went to urgent care on Monday 12/18 for having chest discomfort with a deep breath. He was DX with afib and was offered 911. His friend took him to [facility], but there were 65 patients,so he left. Recommended that if he becomes SOB and lightheaded to call 911. Gave him triggers to avoid: caffeine, ETOH, chocolate, dehydration. He verbalized understanding of this conversation. Appointment made with Vella Raring on 12/27. He will bring EKG and paperwork from 12/18.

## 2022-01-14 NOTE — ED Provider Notes (Signed)
[place] EMERGENCY DEPT  Provider Note  CSN: [id number] Arrival date & time: 01/14/22 2223  History Chief Complaint  Patient presents with  . Chest Pain    Frank Carpenter is a 71 y.o. male with history of mildly dilated thoracic aorta on echo in 2021, HTN and HLD reports he woke up Monday (12/18) with sharp chest pains, worse with deep breath and some SOB. He went to the [facility] clinic that day and was found to be in new-onset Afib, he was directed to the ED but after getting to [facility] he saw the number of people in the waiting room and decided to go home. He reports the chest pain has since resolved, although he continues to have some L shoulder pain which pre-dated his other symptoms and was diagnosed as a 'tear' by Sports Medicine. He reports he continues to feel weak/tired which has gotten worse as the week has gone on. He has not had any cough or fever. He noticed some puffiness in his ankles and R calf discomfort which is unusual for him.    Home Medications Prior to Admission medications   Medication Sig Start Date End Date Taking? Authorizing Provider  [facility]S 12 HOUR NASAL DECONGESTANT 120 MG 12 hr tablet Take 120 mg by mouth as needed. 06/24/21   [provider]  ezetimibe (ZETIA) 10 MG tablet Take 10 mg by mouth daily. 03/18/21   [provider]  fluticasone (FLONASE) 50 MCG/ACT nasal spray Place 1 spray into both nostrils as needed. 02/23/21   [provider]  hydrOXYzine (ATARAX) 25 MG tablet Take 25 mg by mouth every 8 (eight) hours as needed. 04/27/21   [provider]  methadone (DOLOPHINE) 10 MG tablet Take 10 mg by mouth every 8 (eight) hours.    [provider]  olmesartan (BENICAR) 40 MG tablet Take 40 mg by mouth in the morning and at bedtime. Take half A TABLET IN THE MORNING AND HALF IN THE [place] Patient not taking: Reported on 06/30/2021    [provider]  Olmesartan-amLODIPine-HCTZ 40-5-12.5 MG  TABS Take 1 tablet by mouth daily. 05/26/21   [provider]  omeprazole (PRILOSEC) 40 MG capsule Take by mouth as needed. 06/24/21 07/24/21  [provider]  testosterone cypionate (DEPOTESTOSTERONE CYPIONATE) 200 MG/ML injection SMARTSIG:0.5 Milliliter(s) IM Once a Week 04/22/19   [provider]     Allergies    Statins   Review of Systems   Review of Systems Please see HPI for pertinent positives and negatives  Physical Exam BP 114/78   Pulse 92   Resp 18   Ht '5\' 10"'$  (1.778 m)   Wt 107.5 kg   SpO2 90%   BMI 34.01 kg/m   Physical Exam Vitals and nursing note reviewed.  Constitutional:      Appearance: Normal appearance.  HENT:     Head: Normocephalic and atraumatic.     Nose: Nose normal.     Mouth/Throat:     Mouth: Mucous membranes are moist.  Eyes:     Extraocular Movements: Extraocular movements intact.     Conjunctiva/sclera: Conjunctivae normal.  Cardiovascular:     Rate and Rhythm: Tachycardia present. Rhythm irregular.  Pulmonary:     Effort: Pulmonary effort is normal.     Breath sounds: Normal breath sounds.  Abdominal:     General: Abdomen is flat.     Palpations: Abdomen is soft.     Tenderness: There is no abdominal tenderness.  Musculoskeletal:        General: No swelling. Normal range of motion.     Cervical back: Neck supple.  Skin:    General: Skin is warm and dry.  Neurological:     General: No focal deficit present.     Mental Status: He is alert.  Psychiatric:        Mood and Affect: Mood normal.     ED Results / Procedures / Treatments   EKG EKG Interpretation  Date/Time:  Friday January 14 2022 22:39:25 EST Ventricular Rate:  128 PR Interval:    QRS Duration: 94 QT Interval:  310 QTC Calculation: 452 R Axis:   72 Text Interpretation: Atrial fibrillation with rapid ventricular response with premature ventricular or aberrantly conducted complexes Cannot rule out Anterior infarct , age undetermined  Abnormal ECG No previous ECGs available Confirmed by Calvert Cantor [phone]) on 01/14/2022 11:14:25 PM  Procedures .Critical Care  Performed by: Truddie Hidden, MD Authorized by: Truddie Hidden, MD   Critical care provider statement:    Critical care time (minutes):  60   Critical care time was exclusive of:  Separately billable procedures and treating other patients   Critical care was necessary to treat or prevent imminent or life-threatening deterioration of the following conditions:  Cardiac failure   Critical care was time spent personally by me on the following activities:  Development of treatment plan with patient or surrogate, discussions with consultants, evaluation of patient's response to treatment, examination of patient, ordering and review of laboratory studies, ordering and review of radiographic studies, ordering and performing treatments and interventions, pulse oximetry, re-evaluation of patient's condition and review of old charts   Medications Ordered in the ED Medications  diltiazem (CARDIZEM) 1 mg/mL load via infusion 10 mg (10 mg Intravenous Bolus from Bag 01/14/22 2327)    And  diltiazem (CARDIZEM) 125 mg in dextrose 5% 125 mL (1 mg/mL) infusion (5 mg/hr Intravenous New Bag/Given 01/14/22 2328)  iohexol (OMNIPAQUE) 350 MG/ML injection 90 mL (90 mLs Intravenous Contrast Given 01/14/22 2346)  HYDROcodone-acetaminophen (NORCO/VICODIN) 5-325 MG per tablet 1 tablet (1 tablet Oral Given 01/15/22 0106)  hydrOXYzine (ATARAX) tablet 50 mg (50 mg Oral Given 01/15/22 0123)    Initial Impression and Plan  Patient here with symptomatic afib for several days, now with rapid rate. Given his report of pleuritic chest pain and leg swelling earlier in the week, will need to rule out PE. Labs order in triage are pending. CTA ordered. I personally viewed the images from radiology studies and agree with radiologist interpretation: CXR is clear. Plan diltiazem bolus and infusion  for rate control.   ED Course   Clinical Course as of 01/15/22 0435  Fri Jan 14, 2022  2315 CBC with mild leukocytosis, otherwise normal.  [CS]  2338 BMP and Trop are normal.  [CS]  Sat Jan 15, 2022  0059 I personally viewed the images from radiology studies and agree with radiologist interpretation: CTA is neg for PE. Ascending aorta dilation is still present. Rate improved with diltiazem drip. Plan admission for further management. He is requesting Norco for his shoulder.   [CS]  [phone] Spoke with Dr. Marlowe Sax, Hospitalist, who will accept for admission. She requests we begin heparin for now. Will be transitioned to oral AC on admission.  [CS]  0426 Patient's HR has been well controlled for several hours on diltiazem drip. Spoke with Pharmacy regarding transition to oral and he recommends starting diltiazem '60mg'$  q6 hours and stopping  the drip about an hour after first dose.  [CS]  6226 Prior to starting the transition to oral therapy he was assigned a bed. Will cancel the oral dose and let the inpatient team make those changes on arrival to [facility].  [CS]    Clinical Course User Index [CS] Truddie Hidden, MD     MDM Rules/Calculators/A&P Medical Decision Making Problems Addressed: New onset a-fib [facility]): acute illness or injury  Amount and/or Complexity of Data Reviewed Labs: ordered. Decision-making details documented in ED Course. Radiology: ordered and independent interpretation performed. Decision-making details documented in ED Course. ECG/medicine tests: ordered and independent interpretation performed. Decision-making details documented in ED Course.  Risk Prescription drug management. Drug therapy requiring intensive monitoring for toxicity. Decision regarding hospitalization.    Final Clinical Impression(s) / ED Diagnoses Final diagnoses:  New onset a-fib [facility])    Rx / DC Orders ED Discharge Orders     None        Truddie Hidden, MD 01/15/22 0246

## 2022-01-14 NOTE — Telephone Encounter (Signed)
We need to request records from urgent care. Unavailable in Epic. If he is truly in atrial fibrillation he needs blood thinner to prevent a stroke  Recommend he be evaluated in the ED. He could go to [place].   Loel Dubonnet, NP

## 2022-01-15 ENCOUNTER — Encounter (HOSPITAL_COMMUNITY): Payer: Self-pay

## 2022-01-15 ENCOUNTER — Observation Stay (HOSPITAL_BASED_OUTPATIENT_CLINIC_OR_DEPARTMENT_OTHER): Payer: Medicare Other

## 2022-01-15 DIAGNOSIS — G8929 Other chronic pain: Secondary | ICD-10-CM | POA: Diagnosis present

## 2022-01-15 DIAGNOSIS — D72829 Elevated white blood cell count, unspecified: Secondary | ICD-10-CM

## 2022-01-15 DIAGNOSIS — I4891 Unspecified atrial fibrillation: Secondary | ICD-10-CM | POA: Diagnosis present

## 2022-01-15 DIAGNOSIS — I714 Abdominal aortic aneurysm, without rupture, unspecified: Secondary | ICD-10-CM | POA: Diagnosis present

## 2022-01-15 DIAGNOSIS — I7121 Aneurysm of the ascending aorta, without rupture: Secondary | ICD-10-CM

## 2022-01-15 DIAGNOSIS — G4733 Obstructive sleep apnea (adult) (pediatric): Secondary | ICD-10-CM

## 2022-01-15 DIAGNOSIS — I5032 Chronic diastolic (congestive) heart failure: Secondary | ICD-10-CM

## 2022-01-15 DIAGNOSIS — E291 Testicular hypofunction: Secondary | ICD-10-CM | POA: Diagnosis present

## 2022-01-15 DIAGNOSIS — Z8249 Family history of ischemic heart disease and other diseases of the circulatory system: Secondary | ICD-10-CM | POA: Diagnosis not present

## 2022-01-15 DIAGNOSIS — I480 Paroxysmal atrial fibrillation: Secondary | ICD-10-CM | POA: Diagnosis not present

## 2022-01-15 DIAGNOSIS — Z8052 Family history of malignant neoplasm of bladder: Secondary | ICD-10-CM | POA: Diagnosis not present

## 2022-01-15 DIAGNOSIS — E669 Obesity, unspecified: Secondary | ICD-10-CM | POA: Diagnosis present

## 2022-01-15 DIAGNOSIS — I110 Hypertensive heart disease with heart failure: Secondary | ICD-10-CM | POA: Diagnosis present

## 2022-01-15 DIAGNOSIS — Z79899 Other long term (current) drug therapy: Secondary | ICD-10-CM | POA: Diagnosis not present

## 2022-01-15 DIAGNOSIS — Z888 Allergy status to other drugs, medicaments and biological substances status: Secondary | ICD-10-CM | POA: Diagnosis not present

## 2022-01-15 DIAGNOSIS — E7800 Pure hypercholesterolemia, unspecified: Secondary | ICD-10-CM | POA: Diagnosis present

## 2022-01-15 DIAGNOSIS — Z6832 Body mass index (BMI) 32.0-32.9, adult: Secondary | ICD-10-CM | POA: Diagnosis not present

## 2022-01-15 DIAGNOSIS — I10 Essential (primary) hypertension: Secondary | ICD-10-CM | POA: Diagnosis not present

## 2022-01-15 DIAGNOSIS — M25512 Pain in left shoulder: Secondary | ICD-10-CM | POA: Diagnosis present

## 2022-01-15 DIAGNOSIS — Z79890 Hormone replacement therapy (postmenopausal): Secondary | ICD-10-CM | POA: Diagnosis not present

## 2022-01-15 DIAGNOSIS — I712 Thoracic aortic aneurysm, without rupture, unspecified: Secondary | ICD-10-CM | POA: Diagnosis present

## 2022-01-15 DIAGNOSIS — Z1152 Encounter for screening for COVID-19: Secondary | ICD-10-CM | POA: Diagnosis not present

## 2022-01-15 LAB — ECHOCARDIOGRAM COMPLETE
AR max vel: 1.95 cm2
AV Area VTI: 1.97 cm2
AV Area mean vel: 1.89 cm2
AV Mean grad: 2.3 mmHg
AV Peak grad: 4.3 mmHg
Ao pk vel: 1.04 m/s
Area-P 1/2: 4.01 cm2
Calc EF: 32.2 %
Height: 70 in
MV M vel: 1.85 m/s
MV Peak grad: 13.7 mmHg
S' Lateral: 4 cm
Single Plane A2C EF: 23 %
Single Plane A4C EF: 32.7 %
Weight: 3746.06 oz

## 2022-01-15 LAB — LIPID PANEL
Cholesterol: 151 mg/dL (ref 0–200)
HDL: 44 mg/dL (ref >40)
LDL Cholesterol: 91 mg/dL (ref 0–99)
Total CHOL/HDL Ratio: 3.4 RATIO
Triglycerides: 81 mg/dL (ref <150)
VLDL: 16 mg/dL (ref 0–40)

## 2022-01-15 LAB — MAGNESIUM: Magnesium: 1.7 mg/dL (ref 1.7–2.4)

## 2022-01-15 LAB — BRAIN NATRIURETIC PEPTIDE: B Natriuretic Peptide: 310.9 pg/mL — ABNORMAL HIGH (ref 0.0–100.0)

## 2022-01-15 LAB — RESP PANEL BY RT-PCR (RSV, FLU A&B, COVID)  RVPGX2
Influenza A by PCR: NEGATIVE
Influenza B by PCR: NEGATIVE
Resp Syncytial Virus by PCR: NEGATIVE
SARS Coronavirus 2 by RT PCR: NEGATIVE

## 2022-01-15 LAB — TROPONIN I (HIGH SENSITIVITY): Troponin I (High Sensitivity): 6 ng/L (ref <18)

## 2022-01-15 LAB — TSH: TSH: 1.219 u[IU]/mL (ref 0.350–4.500)

## 2022-01-15 LAB — HEPARIN LEVEL (UNFRACTIONATED)
Heparin Unfractionated: 0.22 IU/mL — ABNORMAL LOW (ref 0.30–0.70)
Heparin Unfractionated: 0.49 IU/mL (ref 0.30–0.70)

## 2022-01-15 MED ORDER — HYDROXYZINE HCL 25 MG PO TABS
25.0000 mg | ORAL_TABLET | Freq: Three times a day (TID) | ORAL | Status: DC | PRN
  Administered 2022-01-16 – 2022-01-17 (×2): 25 mg via ORAL
  Filled 2022-01-15 (×2): qty 1

## 2022-01-15 MED ORDER — METHADONE HCL 10 MG/ML PO CONC: 135.0000 mg | Freq: Every day | ORAL | Status: DC

## 2022-01-15 MED ORDER — ONDANSETRON HCL 4 MG PO TABS: 4.0000 mg | ORAL_TABLET | Freq: Four times a day (QID) | ORAL | Status: DC | PRN

## 2022-01-15 MED ORDER — LORAZEPAM 2 MG/ML IJ SOLN
0.5000 mg | Freq: Once | INTRAMUSCULAR | Status: AC
  Administered 2022-01-15: 0.5 mg via INTRAVENOUS
  Filled 2022-01-15: qty 1

## 2022-01-15 MED ORDER — HEPARIN BOLUS VIA INFUSION
4000.0000 [IU] | Freq: Once | INTRAVENOUS | Status: AC
  Administered 2022-01-15: 4000 [IU] via INTRAVENOUS

## 2022-01-15 MED ORDER — ONDANSETRON HCL 4 MG/2ML IJ SOLN: 4.0000 mg | Freq: Four times a day (QID) | INTRAMUSCULAR | Status: DC | PRN

## 2022-01-15 MED ORDER — ACETAMINOPHEN 650 MG RE SUPP: 650.0000 mg | Freq: Four times a day (QID) | RECTAL | Status: DC | PRN

## 2022-01-15 MED ORDER — DILTIAZEM HCL 30 MG PO TABS: 60.0000 mg | ORAL_TABLET | Freq: Three times a day (TID) | ORAL | Status: DC

## 2022-01-15 MED ORDER — LIDOCAINE 5 % EX PTCH
1.0000 | MEDICATED_PATCH | CUTANEOUS | Status: DC
  Administered 2022-01-15 – 2022-01-16 (×2): 1 via TRANSDERMAL
  Filled 2022-01-15 (×2): qty 1

## 2022-01-15 MED ORDER — ACETAMINOPHEN 325 MG PO TABS: 650.0000 mg | ORAL_TABLET | Freq: Four times a day (QID) | ORAL | Status: DC | PRN

## 2022-01-15 MED ORDER — FLUTICASONE PROPIONATE 50 MCG/ACT NA SUSP: 2.0000 | Freq: Every day | NASAL | Status: DC | PRN

## 2022-01-15 MED ORDER — HEPARIN BOLUS VIA INFUSION
1500.0000 [IU] | Freq: Once | INTRAVENOUS | Status: AC
  Administered 2022-01-15: 1500 [IU] via INTRAVENOUS
  Filled 2022-01-15: qty 1500

## 2022-01-15 MED ORDER — MAGNESIUM SULFATE IN D5W 1-5 GM/100ML-% IV SOLN
1.0000 g | Freq: Once | INTRAVENOUS | Status: AC
  Administered 2022-01-15: 1 g via INTRAVENOUS
  Filled 2022-01-15: qty 100

## 2022-01-15 MED ORDER — SODIUM CHLORIDE 0.9% FLUSH
3.0000 mL | Freq: Two times a day (BID) | INTRAVENOUS | Status: DC
  Administered 2022-01-16 – 2022-01-17 (×3): 3 mL via INTRAVENOUS

## 2022-01-15 MED ORDER — MIRTAZAPINE 15 MG PO TABS
15.0000 mg | ORAL_TABLET | Freq: Every day | ORAL | Status: DC
  Administered 2022-01-15: 15 mg via ORAL
  Filled 2022-01-15 (×2): qty 1

## 2022-01-15 MED ORDER — ACETAMINOPHEN 325 MG PO TABS
650.0000 mg | ORAL_TABLET | Freq: Once | ORAL | Status: AC
  Administered 2022-01-15: 650 mg via ORAL
  Filled 2022-01-15: qty 2

## 2022-01-15 MED ORDER — PERFLUTREN LIPID MICROSPHERE
1.0000 mL | INTRAVENOUS | Status: AC | PRN
  Administered 2022-01-15: 2 mL via INTRAVENOUS

## 2022-01-15 MED ORDER — HYDROXYZINE HCL 25 MG PO TABS
25.0000 mg | ORAL_TABLET | Freq: Once | ORAL | Status: DC
  Filled 2022-01-15: qty 1

## 2022-01-15 MED ORDER — HYDROXYZINE HCL 25 MG PO TABS
50.0000 mg | ORAL_TABLET | Freq: Once | ORAL | Status: AC
  Administered 2022-01-15: 50 mg via ORAL
  Filled 2022-01-15: qty 2

## 2022-01-15 MED ORDER — DOCUSATE SODIUM 100 MG PO CAPS
400.0000 mg | ORAL_CAPSULE | Freq: Two times a day (BID) | ORAL | Status: DC
  Administered 2022-01-15 – 2022-01-17 (×4): 400 mg via ORAL
  Filled 2022-01-15 (×4): qty 4

## 2022-01-15 MED ORDER — HYDROCODONE-ACETAMINOPHEN 5-325 MG PO TABS
1.0000 | ORAL_TABLET | Freq: Once | ORAL | Status: AC
  Administered 2022-01-15: 1 via ORAL
  Filled 2022-01-15: qty 1

## 2022-01-15 MED ORDER — HEPARIN (PORCINE) 25000 UT/250ML-% IV SOLN
1700.0000 [IU]/h | INTRAVENOUS | Status: DC
  Administered 2022-01-15: 1700 [IU]/h via INTRAVENOUS
  Administered 2022-01-15: 1500 [IU]/h via INTRAVENOUS
  Administered 2022-01-16: 1700 [IU]/h via INTRAVENOUS
  Filled 2022-01-15 (×3): qty 250

## 2022-01-15 MED ORDER — EZETIMIBE 10 MG PO TABS
10.0000 mg | ORAL_TABLET | Freq: Every day | ORAL | Status: DC
  Administered 2022-01-16 – 2022-01-17 (×2): 10 mg via ORAL
  Filled 2022-01-15 (×3): qty 1

## 2022-01-15 MED ORDER — ALBUTEROL SULFATE (2.5 MG/3ML) 0.083% IN NEBU: 2.5000 mg | INHALATION_SOLUTION | Freq: Four times a day (QID) | RESPIRATORY_TRACT | Status: DC | PRN

## 2022-01-15 NOTE — Progress Notes (Signed)
ANTICOAGULATION CONSULT NOTE - Initial Consult  Pharmacy Consult for heparin Indication: atrial fibrillation  Allergies  Allergen Reactions   Statins     Other reaction(s): Other (See Comments) Fatigue    Patient Measurements: Height: '5\' 10"'$  (177.8 cm) Weight: 106.2 kg (234 lb 2.1 oz) IBW/kg (Calculated) : 73 Heparin Dosing Weight: 96 Kg  Vital Signs: Temp: 97.7 F (36.5 C) (12/23 0925) Temp Source: Oral (12/23 0925) BP: 115/70 (12/23 0925) Pulse Rate: 80 (12/23 0925)  Labs: Recent Labs    01/14/22 2302 01/15/22 0039 01/15/22 1023  HGB 14.7  --   --   HCT 42.6  --   --   PLT 290  --   --   HEPARINUNFRC  --   --  0.22*  CREATININE 1.01  --   --   TROPONINIHS 6 6  --      Estimated Creatinine Clearance: 80.7 mL/min (by C-G formula based on SCr of 1.01 mg/dL).   Medical History: Past Medical History:  Diagnosis Date   High cholesterol    Testosterone deficiency      Assessment: 38 yoM with new onset atrial fibrillation. No anticoagulation prior to admission. CBC WNL. Pharmacy to start heparin infusion.   Goal of Therapy:  Heparin level 0.3-0.7 units/ml Monitor platelets by anticoagulation protocol: Yes   Plan:  Bolus heparin 1500 units, Increase heparin infusion to 1700 units/hr  Check anti-Xa level in 6 hours and daily while on heparin Continue to monitor H&H and platelets  Titus Dubin, PharmD PGY1 Pharmacy Resident 01/15/2022 12:12 PM

## 2022-01-15 NOTE — ED Notes (Signed)
Per MD maintain patient on IV cardizem until admission.

## 2022-01-15 NOTE — Plan of Care (Signed)
[facility] will assume care on arrival to accepting facility. Until arrival, care as per EDP. However, [facility] available 24/7 for questions and assistance.  Nursing staff, please page [facility] Admits and Consults ([phone]) as soon as the patient arrives to the hospital.   

## 2022-01-15 NOTE — ED Notes (Signed)
Pt, belongings and paperwork sent with [facility] to [facility] at this time.

## 2022-01-15 NOTE — Plan of Care (Signed)

## 2022-01-15 NOTE — Progress Notes (Signed)
  Echocardiogram 2D Echocardiogram has been performed.  Frank Carpenter 01/15/2022, 4:11 PM

## 2022-01-15 NOTE — ED Notes (Signed)
SBAR Report given to Amy, RN at this time.

## 2022-01-15 NOTE — H&P (Addendum)
History and Physical    Patient: Frank Carpenter PJA:[id number] DOB: 26-Aug-1950 DOA: 01/14/2022 DOS: the patient was seen and examined on 01/15/2022 PCP: Marda Stalker, PA-C  Patient coming from: Transfer from [place]:  Chief Complaint  Patient presents with   Chest Pain   HPI: Frank Carpenter is a 71 y.o. male with medical history significant of hypertension, hyperlipidemia, low testosterone, AAA, OSA on CPAP, and obesity who presents with complaints of chest pain.  Patient reports that he he reportedly started feeling bad about a month ago with fatigue and aches and pains right leg with swelling.  Notes that over the last month his weight is up approximately 8 pounds.  He denies having any palpitations, but had gotten at [facility] watch which had also noted that he was in atrial fibrillation sometime after the eighth of this month.  5 days ago he woke up from sleep and reported having significant pain across his chest that worsened whenever trying to take a deep inspiratory breath.  He went to a walk-in clinic that day and found to be in  A-fib and was recommended to come to the emergency department immediately.  He has started to feel better at that time and went to [facility] long ED, but after seeing the number of people in the waiting room left.  He reports having increasing pain from left shoulder with history of a prior tear of the shoulder over 10 years ago and had been dormant until here recently.  He went to a sports medicine doctor the other day and states that he was diagnosed with a tear.  He is on methadone and states that it is not relieving his pain symptoms.  As the week is gone on patient reports that he is continue to feel weak and tired which she went to [place] emergency department the other day.  Patient does make note that he had been evaluated by Dr. Alvester Chou earlier this year for issues with dizziness and symptoms were thought to be more so related to her  inner ear issue.  In the emergency department patient was noted to be afebrile with heart rates into the 1 teens in atrial fibrillation, respirations 13-26, O2 saturations documented as low as 87% and O2 saturations currently maintained on 2 L of nasal cannula oxygen.  Labs significant for WBC elevated 12.6, BUN 24, creatinine 1.01, and high-sensitivity troponins negative x 2.  CT angiogram of the chest showed no signs of PE, noted cardiomegaly with coronary artery calcifications, and aneurysmal dilation of the ascending aorta measuring 4.5 cm for which semiannual screening is recommended and referral to cardiothoracic surgery.  Patient had been given diltiazem 10 mg IV and started on diltiazem drip, acetaminophen 650 mg, hydrocodone, and started on a heparin drip  Review of Systems: As mentioned in the history of present illness. All other systems reviewed and are negative. Past Medical History:  Diagnosis Date   High cholesterol    Testosterone deficiency    No past surgical history on file. Social History:  reports that he has never smoked. He has never used smokeless tobacco. He reports current alcohol use of about 2.0 standard drinks of alcohol per week. He reports current drug use. Drug: Hydrocodone.  Allergies  Allergen Reactions   Statins     Other reaction(s): Other (See Comments) Fatigue    Family History  Problem Relation Age of Onset   Bladder Cancer Mother    Hypertension Father  Prior to Admission medications   Medication Sig Start Date End Date Taking? Authorizing Provider  [employer]S 12 HOUR NASAL DECONGESTANT 120 MG 12 hr tablet Take 120 mg by mouth daily as needed for congestion. 06/24/21  Yes [provider]  docusate sodium (COLACE) 100 MG capsule Take 400 mg by mouth 2 (two) times daily.   Yes [provider]  ezetimibe (ZETIA) 10 MG tablet Take 10 mg by mouth daily. 03/18/21  Yes [provider]  fluticasone (FLONASE) 50 MCG/ACT nasal spray  Place 2 sprays into both nostrils daily as needed for allergies. 02/23/21  Yes [provider]  hydrOXYzine (ATARAX) 25 MG tablet Take 25 mg by mouth every 8 (eight) hours as needed for anxiety. 04/27/21  Yes [provider]  methadone (DOLOPHINE) 10 MG/ML solution Take 135 mg by mouth daily.   Yes [provider]  mirtazapine (REMERON) 15 MG tablet Take 15 mg by mouth at bedtime. 11/06/21  Yes [provider]  Multiple Vitamin (MULTIVITAMIN) capsule Take 1 capsule by mouth daily.   Yes [provider]  Olmesartan-amLODIPine-HCTZ 40-5-12.5 MG TABS Take 1 tablet by mouth daily. 05/26/21  Yes [provider]  psyllium (METAMUCIL SMOOTH TEXTURE) 28 % packet Take 1 packet by mouth daily as needed (constipation).   Yes [provider]  testosterone cypionate (DEPOTESTOSTERONE CYPIONATE) 200 MG/ML injection Inject 100 mg into the muscle once a week. Thursday 04/22/19  Yes [provider]    Physical Exam: Vitals:   01/15/22 0500 01/15/22 0515 01/15/22 0632 01/15/22 0648  BP: (!) 113/90 (!) 112/92  113/75  Pulse: 93 86  89  Resp: 18 15    Temp:    98.2 F (36.8 C)  TempSrc:    Oral  SpO2: 98% 94%  94%  Weight:   106.2 kg   Height:   '5\' 10"'$  (1.778 m)    Constitutional: Obese elderly male who appears to be in acute distress at this time Eyes: PERRL, lids and conjunctivae normal ENMT: Mucous membranes are moist. Posterior pharynx clear of any exudate or lesions.  Neck: normal, supple, no significant JVD appreciated Respiratory: Normal rate and effort without significant wheezes appreciated at this time.  Patient currently on 2 L nasal cannula oxygen with O2 saturations maintained. Cardiovascular: Irregular rhythm.  Trace lower extremity edema. Abdomen: no tenderness, no masses palpated. No hepatosplenomegaly. Bowel sounds positive.  Musculoskeletal: no clubbing / cyanosis.  Decreased range of vision of the left shoulder. Skin: no  rashes, lesions, ulcers. No induration Neurologic: CN 2-12 grossly intact.  Strength 5/5 in all 4.  Psychiatric: Normal judgment and insight. Alert and oriented x 3. Normal mood.   Data Reviewed:  EKG revealed atrial fibrillation at 128 bpm.  Reviewed labs, imaging and pertinent records as noted above in HPI  Assessment and Plan:  Atrial fibrillation with RVR Patient was found to be in A-fib with RVR with heart rates initially in the 120s.  High-sensitivity troponins negative x 2.  CT angiogram of the chest was obtained which did not reveal any signs of a pulmonary embolism.  He had been started a Cardizem and heparin drip.  Chest x-ray noted no acute abnormality.CHA2DS2-VASc score =2.   -Admit to a progressive bed -Continue Cardizem and heparin drip -Goal potassium at least 4 and magnesium at least 2 -Check TSH -Check echocardiogram -Cardiology consulted, will follow-up with patient in a.m.  Leukocytosis Acute.  WBC elevated to 12.6.  Chest x-ray showed no acute abnormality. -Check respiratory  virus panel -Recheck CBC tomorrow morning  Diastolic congestive heart failure Possibly acute on chronic.  Patient reports having weight gain of approximately 8 pounds over the last month with increasing swelling of his lower legs.  Patient's last echocardiogram noted EF to be 60 to 65% with grade 1 diastolic dysfunction in June of this year.   -Strict I&O's and daily weights -Follow-up echocardiogram -Check BNP  Essential hypertension Blood pressures have been 105/80 -128/92. Home blood pressure regimen includes olmesartan-amlodipine-hydrochlorothiazide 40-5-12.5 mg daily. -Held home blood pressure regimen while on Cardizem gtt  Chronic pain -Continue methadone  Left shoulder pain Acute on chronic.  He had been seen by sports medicine earlier this week and diagnosed with likely tear of the shoulder.  He reports having a tear of the shoulder that occurred over 10 years ago but had not been  causing him pain until recently. -Lidocaine patch  Testosterone deficiency Patient is on testosterone injections in the outpatient setting.  Abdominal aortic aneurysm Seen on CT imaging noted aneurysm measuring 4.5 cm.  Previously noted to be 4.2 cm back in 07/2020. -Patient needs referral to cardiothoracic surgery in the outpatient setting and semiannual monitoring  Obstructive sleep apnea Patient's O2 saturations were reported to be as low as 87% while patient was resting.  O2 saturations improved on 2 L nasal cannula oxygen but normally patient wears CPAP at night. -Continue CPAP at night  DVT prophylaxis: Heparin Advance Care Planning:   Code Status: Full Code    Consults: Cardiology  Family Communication: None requested  Severity of Illness: The appropriate patient status for this patient is INPATIENT. Inpatient status is judged to be reasonable and necessary in order to provide the required intensity of service to ensure the patient's safety. The patient's presenting symptoms, physical exam findings, and initial radiographic and laboratory data in the context of their chronic comorbidities is felt to place them at high risk for further clinical deterioration. Furthermore, it is not anticipated that the patient will be medically stable for discharge from the hospital within 2 midnights of admission.   * I certify that at the point of admission it is my clinical judgment that the patient will require inpatient hospital care spanning beyond 2 midnights from the point of admission due to high intensity of service, high risk for further deterioration and high frequency of surveillance required.*  Author: Norval Morton, MD 01/15/2022 8:32 AM  For on call review [email or web address].

## 2022-01-15 NOTE — Progress Notes (Signed)
[place] for heparin Indication: atrial fibrillation  Allergies  Allergen Reactions   Statins     Other reaction(s): Other (See Comments) Fatigue    Patient Measurements: Height: '5\' 10"'$  (177.8 cm) Weight: 106.2 kg (234 lb 2.1 oz) IBW/kg (Calculated) : 73 Heparin Dosing Weight: 96 Kg  Vital Signs: Temp: 97.8 F (36.6 C) (12/23 1940) Temp Source: Oral (12/23 1940) BP: 120/90 (12/23 1940) Pulse Rate: 87 (12/23 1940)  Labs: Recent Labs    01/14/22 2302 01/15/22 0039 01/15/22 1023 01/15/22 1847  HGB 14.7  --   --   --   HCT 42.6  --   --   --   PLT 290  --   --   --   HEPARINUNFRC  --   --  0.22* 0.49  CREATININE 1.01  --   --   --   TROPONINIHS 6 6  --   --      Estimated Creatinine Clearance: 80.7 mL/min (by C-G formula based on SCr of 1.01 mg/dL).   Medical History: Past Medical History:  Diagnosis Date   High cholesterol    Testosterone deficiency      Assessment: 59 yoM with new onset atrial fibrillation. No anticoagulation prior to admission. CBC WNL. Pharmacy to start heparin infusion.  Heparin level came back therapeutic at 0.49, on 1700 units/hr. CBC stable. No infusion issues or s/sx of bleeding.   Goal of Therapy:  Heparin level 0.3-0.7 units/ml Monitor platelets by anticoagulation protocol: Yes   Plan:  Continue heparin infusion at 1700 units/hr  Check anti-Xa level daily while on heparin Continue to monitor H&H and platelets  Antonietta Jewel, PharmD, [place] Pharmacist  Phone: [phone] 01/15/2022 8:02 PM  Please check AMION for all [place] phone numbers After 10:00 PM, call [place] [phone]

## 2022-01-15 NOTE — Progress Notes (Signed)
ANTICOAGULATION CONSULT NOTE - Initial Consult  Pharmacy Consult for heparin Indication: atrial fibrillation  Allergies  Allergen Reactions   Statins     Other reaction(s): Other (See Comments) Fatigue    Patient Measurements: Height: '5\' 10"'$  (177.8 cm) Weight: 107.5 kg (237 lb) IBW/kg (Calculated) : 73 Heparin Dosing Weight: 96 Kg  Vital Signs: BP: 114/78 (12/23 0230) Pulse Rate: 92 (12/23 0230)  Labs: Recent Labs    01/14/22 2302 01/15/22 0039  HGB 14.7  --   HCT 42.6  --   PLT 290  --   CREATININE 1.01  --   TROPONINIHS 6 6    Estimated Creatinine Clearance: 81.2 mL/min (by C-G formula based on SCr of 1.01 mg/dL).   Medical History: Past Medical History:  Diagnosis Date   High cholesterol    Testosterone deficiency      Assessment: 53 yoM with new onset atrial fibrillation. No anticoagulation prior to admission. CBC WNL. Pharmacy to start heparin infusion.   Goal of Therapy:  Heparin level 0.3-0.7 units/ml Monitor platelets by anticoagulation protocol: Yes   Plan:  Give 4000 units bolus x 1 Start heparin infusion at 1500 units/hr Check anti-Xa level in 6 hours and daily while on heparin Continue to monitor H&H and platelets  Georga Bora, PharmD Clinical Pharmacist 01/15/2022 3:08 AM Please check AMION for all [place] numbers

## 2022-01-16 DIAGNOSIS — I4891 Unspecified atrial fibrillation: Secondary | ICD-10-CM | POA: Diagnosis not present

## 2022-01-16 LAB — BASIC METABOLIC PANEL
Anion gap: 9 (ref 5–15)
BUN: 16 mg/dL (ref 8–23)
CO2: 26 mmol/L (ref 22–32)
Calcium: 9.4 mg/dL (ref 8.9–10.3)
Chloride: 102 mmol/L (ref 98–111)
Creatinine, Ser: 1.14 mg/dL (ref 0.61–1.24)
GFR, Estimated: >60 mL/min (ref >60)
Glucose, Bld: 104 mg/dL — ABNORMAL HIGH (ref 70–99)
Potassium: 4.2 mmol/L (ref 3.5–5.1)
Sodium: 137 mmol/L (ref 135–145)

## 2022-01-16 LAB — CBC
HCT: 49.7 % (ref 39.0–52.0)
Hemoglobin: 16.6 g/dL (ref 13.0–17.0)
MCH: 32.3 pg (ref 26.0–34.0)
MCHC: 33.4 g/dL (ref 30.0–36.0)
MCV: 96.7 fL (ref 80.0–100.0)
Platelets: 289 10*3/uL (ref 150–400)
RBC: 5.14 MIL/uL (ref 4.22–5.81)
RDW: 12.8 % (ref 11.5–15.5)
WBC: 15.4 10*3/uL — ABNORMAL HIGH (ref 4.0–10.5)
nRBC: 0 % (ref 0.0–0.2)

## 2022-01-16 LAB — HEPARIN LEVEL (UNFRACTIONATED): Heparin Unfractionated: 0.51 IU/mL (ref 0.30–0.70)

## 2022-01-16 MED ORDER — METOPROLOL TARTRATE 25 MG PO TABS
25.0000 mg | ORAL_TABLET | Freq: Four times a day (QID) | ORAL | Status: DC
Start: 2022-01-16 — End: 2022-01-16

## 2022-01-16 MED ORDER — METOPROLOL TARTRATE 50 MG PO TABS: 50.0000 mg | ORAL_TABLET | Freq: Two times a day (BID) | ORAL | Status: DC

## 2022-01-16 MED ORDER — FUROSEMIDE 40 MG PO TABS: 40.0000 mg | ORAL_TABLET | Freq: Two times a day (BID) | ORAL | 0 refills | Status: DC | PRN

## 2022-01-16 MED ORDER — APIXABAN 5 MG PO TABS: 5.0000 mg | ORAL_TABLET | Freq: Two times a day (BID) | ORAL | 1 refills | Status: DC

## 2022-01-16 MED ORDER — FUROSEMIDE 10 MG/ML IJ SOLN
40.0000 mg | Freq: Once | INTRAMUSCULAR | Status: AC
  Administered 2022-01-16: 40 mg via INTRAVENOUS
  Filled 2022-01-16: qty 4

## 2022-01-16 MED ORDER — METHADONE HCL 10 MG/ML PO CONC
135.0000 mg | Freq: Once | ORAL | Status: AC
  Administered 2022-01-16: 135 mg via ORAL

## 2022-01-16 MED ORDER — MAGNESIUM SULFATE 2 GM/50ML IV SOLN
2.0000 g | Freq: Once | INTRAVENOUS | Status: AC
  Administered 2022-01-16: 2 g via INTRAVENOUS
  Filled 2022-01-16: qty 50

## 2022-01-16 MED ORDER — METOPROLOL TARTRATE 50 MG PO TABS: 50.0000 mg | ORAL_TABLET | Freq: Two times a day (BID) | ORAL | 1 refills | Status: DC

## 2022-01-16 MED ORDER — METOPROLOL TARTRATE 50 MG PO TABS
50.0000 mg | ORAL_TABLET | Freq: Two times a day (BID) | ORAL | Status: DC
  Administered 2022-01-16 (×2): 50 mg via ORAL
  Filled 2022-01-16 (×2): qty 1

## 2022-01-16 MED ORDER — APIXABAN 5 MG PO TABS
5.0000 mg | ORAL_TABLET | Freq: Two times a day (BID) | ORAL | Status: DC
  Administered 2022-01-16 – 2022-01-17 (×3): 5 mg via ORAL
  Filled 2022-01-16 (×3): qty 1

## 2022-01-16 NOTE — Plan of Care (Signed)

## 2022-01-16 NOTE — Care Management (Signed)
Patient provided with 30d Eliquis card

## 2022-01-16 NOTE — Discharge Summary (Signed)
Physician Discharge Summary   Patient: Frank Carpenter MRN: [id number] DOB: 08-Nov-1950  Admit date:     01/14/2022  Discharge date: 01/16/22  Discharge Physician: Deatra James   PCP: Marda Stalker, PA-C   Recommendations at discharge:   Medication added on this admission metoprolol and Eliquis Follow with a cardiologist within 2-4 weeks Follow-up with your PCP in 1-4 weeks Continue taking current medication, with a close follow-up medication subjective change in CT imaging noted aneurysm measuring 4.5 cm.  Previously noted to be 4.2 cm back in 07/2020. Follow-up with cardiothoracic surgery in the outpatient setting and semiannual monitoring  Discharge Diagnoses: Principal Problem:   Atrial fibrillation with RVR (HCC) Active Problems:   Leukocytosis   Chronic diastolic CHF (congestive heart failure) (HCC)   Essential hypertension   Left shoulder pain   Thoracic aortic aneurysm (HCC)   OSA (obstructive sleep apnea)  Resolved Problems:   * No resolved hospital problems. *   Frank Carpenter is a 71 y.o. male with medical history significant of hypertension, hyperlipidemia, low testosterone, AAA, OSA on CPAP, and obesity who presents with complaints of chest pain.  Patient reports that he he reportedly started feeling bad about a month ago with fatigue and aches and pains right leg with swelling.  Notes that over the last month his weight is up approximately 8 pounds.  He denies having any palpitations, but had gotten at [facility] watch which had also noted that he was in atrial fibrillation sometime after the eighth of this month.  5 days ago he woke up from sleep and reported having significant pain across his chest that worsened whenever trying to take a deep inspiratory breath.  He went to a walk-in clinic that day and found to be in  A-fib and was recommended to come to the emergency department immediately.  He has started to feel better at that time and went to [facility] long ED,  but after seeing the number of people in the waiting room left.  He reports having increasing pain from left shoulder with history of a prior tear of the shoulder over 10 years ago and had been dormant until here recently.  He went to a sports medicine doctor the other day and states that he was diagnosed with a tear.  He is on methadone and states that it is not relieving his pain symptoms.  As the week is gone on patient reports that he is continue to feel weak and tired which she went to [place] emergency department the other day.   Patient does make note that he had been evaluated by Dr. Alvester Chou earlier this year for issues with dizziness and symptoms were thought to be more so related to her inner ear issue.   In the emergency department patient was noted to be afebrile with heart rates into the 1 teens in atrial fibrillation, respirations 13-26, O2 saturations documented as low as 87% and O2 saturations currently maintained on 2 L of nasal cannula oxygen.  Labs significant for WBC elevated 12.6, BUN 24, creatinine 1.01, and high-sensitivity troponins negative x 2.  CT angiogram of the chest showed no signs of PE, noted cardiomegaly with coronary artery calcifications, and aneurysmal dilation of the ascending aorta measuring 4.5 cm for which semiannual screening is recommended and referral to cardiothoracic surgery.  Patient had been given diltiazem 10 mg IV and started on diltiazem drip, acetaminophen 650 mg, hydrocodone, and started on a heparin drip    New  onset Atrial fibrillation with RVR Patient was found to be in A-fib with RVR with heart rates initially in the 120s.  High-sensitivity troponins negative x 2.  CT angiogram of the chest was obtained which did not reveal any signs of a pulmonary embolism.  He had been started a Cardizem and heparin drip.  Chest x-ray noted no acute abnormality.CHA2DS2-VASc score =3   -Cardiology was consulted Per Dr. Harl Bowie recommendations -Cardizem and heparin  drip >>> switch to p.o. metoprolol -Goal potassium at least 4 and magnesium at least 2 -Normal level of TSH -Echocardiogram reviewed: EJ EF 40-45%, global hypokinesis of left ventricle, moderately dilated right atrium, valves within normal limits with exception of moderate calcification of aortic valve with no evidence of stenosis -Cardiology has evaluated patient, agreed for patient to continue Eliquis and Metoprolol   Leukocytosis Acute.  Likely reactive WBC elevated to 12.6.  Chest x-ray showed no acute abnormality. -No signs of infection    Diastolic congestive heart failure Possibly acute on chronic.  Patient reports having weight gain of approximately 8 pounds over the last month with increasing swelling of his lower legs.   Acute HFrEF 06/2021 LVEF 60-65% 12/2021 Echo LVEF 40-45%, mild RV dysfunction, mod BAE   -Echo reviewed,EJ EF 40-45%, global hypokinesis of left ventricle, moderately dilated right atrium, valves within normal limits with exception of moderate calcification of aortic valve with no evidence of stenosis -Lasix 40 mg IV x 1, prescribed 40 mg p.o. as needed with swelling of lower extremities per cardiology recommendations  Cardiac calcium score of 0 in 2021 Cardiology would like to repeat echo in 3-6 months  Essential hypertension Blood pressures have been 105/80 -128/92. Home blood pressure regimen includes olmesartan-amlodipine-hydrochlorothiazide 40-5-12.5 mg daily. >>>  Will be discontinued -Discontinuing Cardizem gtt., initiating metoprolol 50 mg twice daily -Held home blood pressure regimen while on Cardizem gtt   Chronic pain -Continue methadone   Left shoulder pain Acute on chronic.  He had been seen by sports medicine earlier this week and diagnosed with likely tear of the shoulder.  He reports having a tear of the shoulder that occurred over 10 years ago but had not been causing him pain until recently. -Lidocaine patch   Testosterone  deficiency Patient is on testosterone injections in the outpatient setting.   Abdominal aortic aneurysm Seen on CT imaging noted aneurysm measuring 4.5 cm.  Previously noted to be 4.2 cm back in 07/2020. -Follow-up with cardiothoracic surgery in the outpatient setting and semiannual monitoring   Obstructive sleep apnea Patient's O2 saturations were reported to be as low as 87% while patient was resting.  O2 saturations improved on 2 L nasal cannula oxygen but normally patient wears CPAP at night. -Continue CPAP at night    Consultants: Cardiologist Dr. Harl Bowie Procedures performed: 2D echocardiogram Disposition: Home Diet recommendation:  Discharge Diet Orders (From admission, onward)     Start     Ordered   01/16/22 0000  Diet - low sodium heart healthy        01/16/22 1200           Cardiac diet DISCHARGE MEDICATION: Allergies as of 01/16/2022       Reactions   Statins    Other reaction(s): Other (See Comments) Fatigue        Medication List     STOP taking these medications    Olmesartan-amLODIPine-HCTZ 40-5-12.5 MG Tabs       TAKE these medications    apixaban 5 MG Tabs tablet Commonly  known as: ELIQUIS Take 1 tablet (5 mg total) by mouth 2 (two) times daily for 60 doses.   [employer] 12 Hour Nasal Decongestant 120 MG 12 hr tablet Generic drug: pseudoephedrine Take 120 mg by mouth daily as needed for congestion.   docusate sodium 100 MG capsule Commonly known as: COLACE Take 400 mg by mouth 2 (two) times daily.   ezetimibe 10 MG tablet Commonly known as: ZETIA Take 10 mg by mouth daily.   fluticasone 50 MCG/ACT nasal spray Commonly known as: FLONASE Place 2 sprays into both nostrils daily as needed for allergies.   furosemide 40 MG tablet Commonly known as: Lasix Take 1 tablet (40 mg total) by mouth 2 (two) times daily as needed for fluid or edema (Shortness of breath, gaining fluid weight 3-5 pounds in 24 hours).   hydrOXYzine 25 MG  tablet Commonly known as: ATARAX Take 25 mg by mouth every 8 (eight) hours as needed for anxiety.   methadone 10 MG/ML solution Commonly known as: DOLOPHINE Take 135 mg by mouth daily.   metoprolol tartrate 50 MG tablet Commonly known as: LOPRESSOR Take 1 tablet (50 mg total) by mouth 2 (two) times daily.   mirtazapine 15 MG tablet Commonly known as: REMERON Take 15 mg by mouth at bedtime.   multivitamin capsule Take 1 capsule by mouth daily.   psyllium 28 % packet Commonly known as: METAMUCIL SMOOTH TEXTURE Take 1 packet by mouth daily as needed (constipation).   testosterone cypionate 200 MG/ML injection Commonly known as: DEPOTESTOSTERONE CYPIONATE Inject 100 mg into the muscle once a week. Thursday        Discharge Exam: Filed Weights   01/14/22 2238 01/15/22 5400 01/16/22 0649  Weight: 107.5 kg 106.2 kg 103 kg      Physical Exam:   General:  AAO x 3,  cooperative, no distress;   HEENT:  Normocephalic, PERRL, otherwise with in Normal limits   Neuro:  CNII-XII intact. , normal motor and sensation, reflexes intact   Lungs:   Clear to auscultation BL, Respirations unlabored,  No wheezes / crackles  Cardio:    Irregularly irregular, S1/S2, RRR, No murmure, No Rubs or Gallops   Abdomen:  Soft, non-tender, bowel sounds active all four quadrants, no guarding or peritoneal signs.  Muscular  skeletal:  Limited exam -global generalized weaknesses - in bed, able to move all 4 extremities,   2+ pulses,  symmetric, No pitting edema  Skin:  Dry, warm to touch, negative for any Rashes,  Wounds: Please see nursing documentation          Condition at discharge: good  The results of significant diagnostics from this hospitalization (including imaging, microbiology, ancillary and laboratory) are listed below for reference.   Imaging Studies: ECHOCARDIOGRAM COMPLETE  Result Date: 01/15/2022    ECHOCARDIOGRAM REPORT   Patient Name:   ZARIAH JOST Carpenter Date of Exam:  01/15/2022 Medical Rec #:  [id number]       Height:       70.0 in Accession #:    [id number]      Weight:       234.1 lb Date of Birth:  1950-10-21       BSA:          2.232 m Patient Age:    46 years        BP:           116/91 mmHg Patient Gender: M  HR:           100 bpm. Exam Location:  Inpatient Procedure: 2D Echo and Intracardiac Opacification Agent Indications:    atrial fibrillation  History:        Patient has prior history of Echocardiogram examinations, most                 recent 06/25/2021. Risk Factors:Hypertension and Dyslipidemia.  Sonographer:    Harvie Junior Referring Phys: [id number] RONDELL A SMITH  Sonographer Comments: Technically difficult study due to poor echo windows and patient is obese. Image acquisition challenging due to patient behavioral factors. and Image acquisition challenging due to respiratory motion. IMPRESSIONS  1. Left ventricular ejection fraction, by estimation, is 40 to 45%. The left ventricle has mildly decreased function. The left ventricle demonstrates global hypokinesis. The left ventricular internal cavity size was mildly dilated. Left ventricular diastolic parameters are indeterminate.  2. Right ventricular systolic function is mildly reduced. The right ventricular size is mildly enlarged. There is normal pulmonary artery systolic pressure.  3. Left atrial size was moderately dilated.  4. Right atrial size was moderately dilated.  5. The mitral valve is abnormal. Trivial mitral valve regurgitation. No evidence of mitral stenosis.  6. The aortic valve is tricuspid. There is moderate calcification of the aortic valve. There is moderate thickening of the aortic valve. Aortic valve regurgitation is not visualized. Aortic valve sclerosis is present, with no evidence of aortic valve stenosis.  7. The inferior vena cava is normal in size with greater than 50% respiratory variability, suggesting right atrial pressure of 3 mmHg. FINDINGS  Left Ventricle: Left  ventricular ejection fraction, by estimation, is 40 to 45%. The left ventricle has mildly decreased function. The left ventricle demonstrates global hypokinesis. The left ventricular internal cavity size was mildly dilated. There is  no left ventricular hypertrophy. Left ventricular diastolic parameters are indeterminate. Right Ventricle: The right ventricular size is mildly enlarged. No increase in right ventricular wall thickness. Right ventricular systolic function is mildly reduced. There is normal pulmonary artery systolic pressure. The tricuspid regurgitant velocity  is 2.46 m/s, and with an assumed right atrial pressure of 3 mmHg, the estimated right ventricular systolic pressure is 01.7 mmHg. Left Atrium: Left atrial size was moderately dilated. Right Atrium: Right atrial size was moderately dilated. Pericardium: There is no evidence of pericardial effusion. Mitral Valve: The mitral valve is abnormal. There is mild thickening of the mitral valve leaflet(s). There is mild calcification of the mitral valve leaflet(s). Trivial mitral valve regurgitation. No evidence of mitral valve stenosis. Tricuspid Valve: The tricuspid valve is normal in structure. Tricuspid valve regurgitation is trivial. No evidence of tricuspid stenosis. Aortic Valve: The aortic valve is tricuspid. There is moderate calcification of the aortic valve. There is moderate thickening of the aortic valve. Aortic valve regurgitation is not visualized. Aortic valve sclerosis is present, with no evidence of aortic valve stenosis. Aortic valve mean gradient measures 2.2 mmHg. Aortic valve peak gradient measures 4.3 mmHg. Aortic valve area, by VTI measures 1.97 cm. Pulmonic Valve: The pulmonic valve was normal in structure. Pulmonic valve regurgitation is not visualized. No evidence of pulmonic stenosis. Aorta: The aortic root is normal in size and structure. Venous: The inferior vena cava is normal in size with greater than 50% respiratory  variability, suggesting right atrial pressure of 3 mmHg. IAS/Shunts: No atrial level shunt detected by color flow Doppler.  LEFT VENTRICLE PLAX 2D LVIDd:         5.00 cm  Diastology LVIDs:         4.00 cm      LV e' medial:    11.65 cm/s LV PW:         0.90 cm      LV E/e' medial:  8.9 LV IVS:        0.90 cm      LV e' lateral:   15.63 cm/s LVOT diam:     1.80 cm      LV E/e' lateral: 6.6 LV SV:         36 LV SV Index:   16 LVOT Area:     2.54 cm  LV Volumes (MOD) LV vol d, MOD A2C: 174.0 ml LV vol d, MOD A4C: 211.0 ml LV vol s, MOD A2C: 134.0 ml LV vol s, MOD A4C: 142.0 ml LV SV MOD A2C:     40.0 ml LV SV MOD A4C:     211.0 ml LV SV MOD BP:      64.4 ml RIGHT VENTRICLE RV Basal diam:  4.40 cm RV Mid diam:    4.00 cm RV S prime:     15.07 cm/s TAPSE (M-mode): 1.4 cm LEFT ATRIUM             Index        RIGHT ATRIUM           Index LA diam:        5.00 cm 2.24 cm/m   RA Area:     23.40 cm LA Vol (A2C):   67.7 ml 30.33 ml/m  RA Volume:   76.70 ml  34.37 ml/m LA Vol (A4C):   71.0 ml 31.81 ml/m LA Biplane Vol: 69.3 ml 31.05 ml/m  AORTIC VALVE                    PULMONIC VALVE AV Area (Vmax):    1.95 cm     PV Vmax:          1.01 m/s AV Area (Vmean):   1.89 cm     PV Peak grad:     4.1 mmHg AV Area (VTI):     1.97 cm     PR End Diast Vel: 4.24 msec AV Vmax:           103.85 cm/s AV Vmean:          71.275 cm/s AV VTI:            0.184 m AV Peak Grad:      4.3 mmHg AV Mean Grad:      2.2 mmHg LVOT Vmax:         79.63 cm/s LVOT Vmean:        53.000 cm/s LVOT VTI:          0.143 m LVOT/AV VTI ratio: 0.77  AORTA Ao Root diam: 3.60 cm Ao Asc diam:  3.60 cm MITRAL VALVE                TRICUSPID VALVE MV Area (PHT): 4.01 cm     TR Peak grad:   24.2 mmHg MV Decel Time: 189 msec     TR Vmax:        246.00 cm/s MR Peak grad: 13.7 mmHg MR Vmax:      185.00 cm/s   SHUNTS MV E velocity: 103.45 cm/s  Systemic VTI:  0.14 m MV A velocity: 42.60 cm/s   Systemic Diam: 1.80 cm MV E/A ratio:  2.43 Collier Salina  Johnsie Cancel MD  Electronically signed by [name] MD Signature Date/Time: 01/15/2022/4:15:24 PM    Final    CT Angio Chest PE W/Cm &/Or Wo Cm  Result Date: 01/15/2022 CLINICAL DATA:  Pulmonary embolism suspected, high probability. New AFib, pleuritic chest pain, and leg swelling. Shortness of breath and nausea. EXAM: CT ANGIOGRAPHY CHEST WITH CONTRAST TECHNIQUE: Multidetector CT imaging of the chest was performed using the standard protocol during bolus administration of intravenous contrast. Multiplanar CT image reconstructions and MIPs were obtained to evaluate the vascular anatomy. RADIATION DOSE REDUCTION: This exam was performed according to the departmental dose-optimization program which includes automated exposure control, adjustment of the mA and/or kV according to patient size and/or use of iterative reconstruction technique. CONTRAST:  74m OMNIPAQUE IOHEXOL 350 MG/ML SOLN COMPARISON:  08/05/2020. FINDINGS: Cardiovascular: The heart is enlarged and there is no pericardial effusion. Scattered coronary artery calcifications are noted. There is atherosclerotic calcification of the aorta with aneurysmal dilatation of the ascending aorta measuring 4.5 cm. The pulmonary trunk is mildly distended suggesting underlying pulmonary artery hypertension. No pulmonary artery filling defect. Mediastinum/Nodes: No mediastinal, hilar, or axillary lymphadenopathy. Thyroid gland, trachea, and esophagus are within normal limits. Lungs/Pleura: Dependent atelectasis is present bilaterally. No effusion or pneumothorax. Upper Abdomen: Mild reflux of contrast into the inferior vena cava and hepatic veins, may be related to right heart failure. No acute abnormality. Musculoskeletal: Old rib fractures on the right. Degenerative changes in the thoracic and cervical spine. No acute osseous abnormality. Review of the MIP images confirms the above findings. IMPRESSION: 1. No evidence of pulmonary embolism or other acute process. 2.  Cardiomegaly with coronary artery calcifications. 3. Aneurysmal dilatation of the ascending aorta measuring 4.5 cm. Ascending thoracic aortic aneurysm. Recommend semi-annual imaging followup by CTA or MRA and referral to cardiothoracic surgery if not already obtained. This recommendation follows 2010 ACCF/AHA/AATS/ACR/ASA/SCA/SCAI/SIR/STS/SVM Guidelines for the Diagnosis and Management of Patients With Thoracic Aortic Disease. Circulation. 2010; 121:: M196-Q229 Aortic aneurysm NOS (ICD10-I71.9) 4. Distended pulmonary trunk suggesting underlying pulmonary artery hypertension. Electronically Signed   By: LBrett FairyM.D.   On: 01/15/2022 00:24   DG Shoulder Left  Result Date: 01/14/2022 CLINICAL DATA:  Chronic pain left shoulder with decreased range of motion EXAM: LEFT SHOULDER - 2+ VIEW COMPARISON:  None Available. FINDINGS: No acute fracture or dislocation. Moderate degenerative arthritis left AC joint. Mild degenerative arthritis glenohumeral joint. Unremarkable soft tissues. IMPRESSION: No acute abnormality. Degenerative arthritis left AC and glenohumeral joints. Electronically Signed   By: TPlacido SouM.D.   On: 01/14/2022 23:04   DG Chest Port 1 View  Result Date: 01/14/2022 CLINICAL DATA:  Chest pain with radiation to the left shoulder EXAM: PORTABLE CHEST 1 VIEW COMPARISON:  CT chest 08/05/2020 FINDINGS: Borderline cardiomegaly. No focal consolidation, pleural effusion, or pneumothorax. No acute osseous abnormality. Remote right rib fractures. IMPRESSION: No active disease. Electronically Signed   By: TPlacido SouM.D.   On: 01/14/2022 23:03    Microbiology: Results for orders placed or performed during the hospital encounter of 01/14/22  Resp panel by RT-PCR (RSV, Flu A&B, Covid) Anterior Nasal Swab     Status: None   Collection Time: 01/15/22  4:27 PM   Specimen: Anterior Nasal Swab  Result Value Ref Range Status   SARS Coronavirus 2 by RT PCR NEGATIVE NEGATIVE Final    Comment:  (NOTE) SARS-CoV-2 target nucleic acids are NOT DETECTED.  The SARS-CoV-2 RNA is generally detectable in upper respiratory specimens during the acute phase of  infection. The lowest concentration of SARS-CoV-2 viral copies this assay can detect is 138 copies/mL. A negative result does not preclude SARS-Cov-2 infection and should not be used as the sole basis for treatment or other patient management decisions. A negative result may occur with  improper specimen collection/handling, submission of specimen other than nasopharyngeal swab, presence of viral mutation(s) within the areas targeted by this assay, and inadequate number of viral copies(<138 copies/mL). A negative result must be combined with clinical observations, patient history, and epidemiological information. The expected result is Negative.  Fact Sheet for Patients:  EntrepreneurPulse.com.au  Fact Sheet for Healthcare Providers:  IncredibleEmployment.be  This test is no t yet approved or cleared by the [place] FDA and  has been authorized for detection and/or diagnosis of SARS-CoV-2 by FDA under an Emergency Use Authorization (EUA). This EUA will remain  in effect (meaning this test can be used) for the duration of the COVID-19 declaration under Section 564(b)(1) of the Act, 21 U.S.C.section 360bbb-3(b)(1), unless the authorization is terminated  or revoked sooner.       Influenza A by PCR NEGATIVE NEGATIVE Final   Influenza B by PCR NEGATIVE NEGATIVE Final    Comment: (NOTE) The Xpert Xpress SARS-CoV-2/FLU/RSV plus assay is intended as an aid in the diagnosis of influenza from Nasopharyngeal swab specimens and should not be used as a sole basis for treatment. Nasal washings and aspirates are unacceptable for Xpert Xpress SARS-CoV-2/FLU/RSV testing.  Fact Sheet for Patients: EntrepreneurPulse.com.au  Fact Sheet for Healthcare  Providers: IncredibleEmployment.be  This test is not yet approved or cleared by the [place] FDA and has been authorized for detection and/or diagnosis of SARS-CoV-2 by FDA under an Emergency Use Authorization (EUA). This EUA will remain in effect (meaning this test can be used) for the duration of the COVID-19 declaration under Section 564(b)(1) of the Act, 21 U.S.C. section 360bbb-3(b)(1), unless the authorization is terminated or revoked.     Resp Syncytial Virus by PCR NEGATIVE NEGATIVE Final    Comment: (NOTE) Fact Sheet for Patients: EntrepreneurPulse.com.au  Fact Sheet for Healthcare Providers: IncredibleEmployment.be  This test is not yet approved or cleared by the [place] FDA and has been authorized for detection and/or diagnosis of SARS-CoV-2 by FDA under an Emergency Use Authorization (EUA). This EUA will remain in effect (meaning this test can be used) for the duration of the COVID-19 declaration under Section 564(b)(1) of the Act, 21 U.S.C. section 360bbb-3(b)(1), unless the authorization is terminated or revoked.  Performed at [facility], [place] [address], [place], [place] [address]     Labs: CBC: Recent Labs  Lab 01/14/22 2302 01/16/22 0031  WBC 12.6* 15.4*  HGB 14.7 16.6  HCT 42.6 49.7  MCV 92.8 96.7  PLT 290 852   Basic Metabolic Panel: Recent Labs  Lab 01/14/22 2302 01/15/22 1023 01/16/22 0031  NA 135  --  137  K 4.0  --  4.2  CL 99  --  102  CO2 25  --  26  GLUCOSE 102*  --  104*  BUN 24*  --  16  CREATININE 1.01  --  1.14  CALCIUM 9.2  --  9.4  MG  --  1.7  --    Liver Function Tests: No results for input(s): "AST", "ALT", "ALKPHOS", "BILITOT", "PROT", "ALBUMIN" in the last 168 hours. CBG: No results for input(s): "GLUCAP" in the last 168 hours.  Discharge time spent: greater than 30 minutes.  Signed: Deatra James,  MD [facility] 01/16/2022

## 2022-01-16 NOTE — Progress Notes (Deleted)
Pt tolerated well to walk in the hallway. Pt's heart rate went up to 140-150 and remains while he was walking.  Pt's heart rate returned to normal rate 60-90's

## 2022-01-16 NOTE — Progress Notes (Signed)
Pt on room air and stable. Pt refuses and denies to wear CPAP QHS. No distress noted. Will continue to monitor

## 2022-01-16 NOTE — Consult Note (Signed)
Cardiology Consultation   Patient ID: Frank Carpenter MRN: [id number]; DOB: 1950/06/28  Admit date: 01/14/2022 Date of Consult: 01/16/2022  PCP:  Marda Stalker, [place] Providers Cardiologist:  Gwenlyn Found 1}     Patient Profile:   Frank Carpenter is a 71 y.o. male with a hx of HTN, HL, aortic aneuryrsm who is being seen 01/16/2022 for the evaluation of afib with RVR at the request of Dr Roger Shelter.  History of Present Illness:   Frank Carpenter 71 yo male history of HTN, HL with statin intolerance, ascending thoracic aneurysm 4.5 cm, presents with chest pain and SOB. In ER found to be in afib with RVR, new diagnosis for the patient. Started on dilt gtt and admitted to hospitalist team, cards consulted to help manage new onset afib.    K 4 Cr 1.01 WBC 12.6 hgb 14.7 Plt 290 Mg 1.7 TSH 1.2 BNP 310 Trop 6-->6 CXR no acute process CT PE no PE, aortic aneurysm 4.5 cm ascending aorta EKG afib with RVR no acute ischemic changes  12/2021 Echo LVEF 40-45%, mild RV dysfunction, mod BAE  06/2021 LVEF 60-65% Past Medical History:  Diagnosis Date   High cholesterol    Testosterone deficiency     No past surgical history on file.    Inpatient Medications: Scheduled Meds:  apixaban  5 mg Oral BID   docusate sodium  400 mg Oral BID   ezetimibe  10 mg Oral Daily   lidocaine  1 patch Transdermal Q24H   methadone  135 mg Oral Daily   mirtazapine  15 mg Oral QHS   sodium chloride flush  3 mL Intravenous Q12H   Continuous Infusions:  diltiazem (CARDIZEM) infusion 7.5 mg/hr (01/16/22 0330)   magnesium sulfate bolus IVPB 2 g (01/16/22 0935)   PRN Meds: acetaminophen **OR** acetaminophen, albuterol, fluticasone, hydrOXYzine, ondansetron **OR** ondansetron (ZOFRAN) IV  Allergies:    Allergies  Allergen Reactions   Statins     Other reaction(s): Other (See Comments) Fatigue    Social History:   Social History   Socioeconomic History   Marital status:  Married    Spouse name: Not on file   Number of children: Not on file   Years of education: Not on file   Highest education level: Not on file  Occupational History   Not on file  Tobacco Use   Smoking status: Never   Smokeless tobacco: Never  Substance and Sexual Activity   Alcohol use: Yes    Alcohol/week: 2.0 standard drinks of alcohol    Types: 2 Cans of beer per week   Drug use: Yes    Types: Hydrocodone   Sexual activity: Not on file  Other Topics Concern   Not on file  Social History Narrative   Not on file   Social Determinants of Health   Financial Resource Strain: Not on file  Food Insecurity: Not on file  Transportation Needs: Not on file  Physical Activity: Not on file  Stress: Not on file  Social Connections: Not on file  Intimate Partner Violence: Not on file    Family History:    Family History  Problem Relation Age of Onset   Bladder Cancer Mother    Hypertension Father      ROS:  Please see the history of present illness.   All other ROS reviewed and negative.     Physical Exam/Data:   Vitals:   01/15/22 1940 01/16/22 0014 01/16/22  3220 01/16/22 0745  BP: (!) 120/90 (!) 120/49  125/79  Pulse: 87 87  (!) 104  Resp: 18   18  Temp: 97.8 F (36.6 C)   98.4 F (36.9 C)  TempSrc: Oral   Oral  SpO2: 95% 93%  95%  Weight:   103 kg   Height:        Intake/Output Summary (Last 24 hours) at 01/16/2022 0958 Last data filed at 01/16/2022 0945 Gross per 24 hour  Intake 1484.08 ml  Output 2900 ml  Net -1415.92 ml      01/16/2022    6:49 AM 01/15/2022    6:32 AM 01/14/2022   10:38 PM  Last 3 Weights  Weight (lbs) 227 lb 1.6 oz 234 lb 2.1 oz 237 lb  Weight (kg) 103.012 kg 106.2 kg 107.502 kg     Body mass index is 32.59 kg/m.  General:  Well nourished, well developed, in no acute distress HEENT: normal Neck: no JVD Vascular: No carotid bruits; Distal pulses 2+ bilaterally Cardiac:  irreg Lungs: mild crackles bilateral bases Abd:  soft, nontender, no hepatomegaly  Ext: trace bilateral edema Musculoskeletal:  No deformities, BUE and BLE strength normal and equal Skin: warm and dry  Neuro:  CNs 2-12 intact, no focal abnormalities noted Psych:  Normal affect     Laboratory Data:  High Sensitivity Troponin:   Recent Labs  Lab 01/14/22 2302 01/15/22 0039  TROPONINIHS 6 6     Chemistry Recent Labs  Lab 01/14/22 2302 01/15/22 1023 01/16/22 0031  NA 135  --  137  K 4.0  --  4.2  CL 99  --  102  CO2 25  --  26  GLUCOSE 102*  --  104*  BUN 24*  --  16  CREATININE 1.01  --  1.14  CALCIUM 9.2  --  9.4  MG  --  1.7  --   GFRNONAA >60  --  >60  ANIONGAP 11  --  9    No results for input(s): "PROT", "ALBUMIN", "AST", "ALT", "ALKPHOS", "BILITOT" in the last 168 hours. Lipids  Recent Labs  Lab 01/15/22 1847  CHOL 151  TRIG 81  HDL 44  LDLCALC 91  CHOLHDL 3.4    Hematology Recent Labs  Lab 01/14/22 2302 01/16/22 0031  WBC 12.6* 15.4*  RBC 4.59 5.14  HGB 14.7 16.6  HCT 42.6 49.7  MCV 92.8 96.7  MCH 32.0 32.3  MCHC 34.5 33.4  RDW 12.5 12.8  PLT 290 289   Thyroid  Recent Labs  Lab 01/15/22 1023  TSH 1.219    BNP Recent Labs  Lab 01/15/22 1847  BNP 310.9*    DDimer No results for input(s): "DDIMER" in the last 168 hours.   Radiology/Studies:  ECHOCARDIOGRAM COMPLETE  Result Date: 01/15/2022    ECHOCARDIOGRAM REPORT   Patient Name:   Frank Carpenter Date of Exam: 01/15/2022 Medical Rec #:  [id number]       Height:       70.0 in Accession #:    [id number]      Weight:       234.1 lb Date of Birth:  1950-10-10       BSA:          2.232 m Patient Age:    48 years        BP:           116/91 mmHg Patient Gender: M  HR:           100 bpm. Exam Location:  Inpatient Procedure: 2D Echo and Intracardiac Opacification Agent Indications:    atrial fibrillation  History:        Patient has prior history of Echocardiogram examinations, most                 recent 06/25/2021. Risk  Factors:Hypertension and Dyslipidemia.  Sonographer:    Harvie Junior Referring Phys: [id number] RONDELL A SMITH  Sonographer Comments: Technically difficult study due to poor echo windows and patient is obese. Image acquisition challenging due to patient behavioral factors. and Image acquisition challenging due to respiratory motion. IMPRESSIONS  1. Left ventricular ejection fraction, by estimation, is 40 to 45%. The left ventricle has mildly decreased function. The left ventricle demonstrates global hypokinesis. The left ventricular internal cavity size was mildly dilated. Left ventricular diastolic parameters are indeterminate.  2. Right ventricular systolic function is mildly reduced. The right ventricular size is mildly enlarged. There is normal pulmonary artery systolic pressure.  3. Left atrial size was moderately dilated.  4. Right atrial size was moderately dilated.  5. The mitral valve is abnormal. Trivial mitral valve regurgitation. No evidence of mitral stenosis.  6. The aortic valve is tricuspid. There is moderate calcification of the aortic valve. There is moderate thickening of the aortic valve. Aortic valve regurgitation is not visualized. Aortic valve sclerosis is present, with no evidence of aortic valve stenosis.  7. The inferior vena cava is normal in size with greater than 50% respiratory variability, suggesting right atrial pressure of 3 mmHg. FINDINGS  Left Ventricle: Left ventricular ejection fraction, by estimation, is 40 to 45%. The left ventricle has mildly decreased function. The left ventricle demonstrates global hypokinesis. The left ventricular internal cavity size was mildly dilated. There is  no left ventricular hypertrophy. Left ventricular diastolic parameters are indeterminate. Right Ventricle: The right ventricular size is mildly enlarged. No increase in right ventricular wall thickness. Right ventricular systolic function is mildly reduced. There is normal pulmonary artery systolic  pressure. The tricuspid regurgitant velocity  is 2.46 m/s, and with an assumed right atrial pressure of 3 mmHg, the estimated right ventricular systolic pressure is 32.4 mmHg. Left Atrium: Left atrial size was moderately dilated. Right Atrium: Right atrial size was moderately dilated. Pericardium: There is no evidence of pericardial effusion. Mitral Valve: The mitral valve is abnormal. There is mild thickening of the mitral valve leaflet(s). There is mild calcification of the mitral valve leaflet(s). Trivial mitral valve regurgitation. No evidence of mitral valve stenosis. Tricuspid Valve: The tricuspid valve is normal in structure. Tricuspid valve regurgitation is trivial. No evidence of tricuspid stenosis. Aortic Valve: The aortic valve is tricuspid. There is moderate calcification of the aortic valve. There is moderate thickening of the aortic valve. Aortic valve regurgitation is not visualized. Aortic valve sclerosis is present, with no evidence of aortic valve stenosis. Aortic valve mean gradient measures 2.2 mmHg. Aortic valve peak gradient measures 4.3 mmHg. Aortic valve area, by VTI measures 1.97 cm. Pulmonic Valve: The pulmonic valve was normal in structure. Pulmonic valve regurgitation is not visualized. No evidence of pulmonic stenosis. Aorta: The aortic root is normal in size and structure. Venous: The inferior vena cava is normal in size with greater than 50% respiratory variability, suggesting right atrial pressure of 3 mmHg. IAS/Shunts: No atrial level shunt detected by color flow Doppler.  LEFT VENTRICLE PLAX 2D LVIDd:         5.00 cm  Diastology LVIDs:         4.00 cm      LV e' medial:    11.65 cm/s LV PW:         0.90 cm      LV E/e' medial:  8.9 LV IVS:        0.90 cm      LV e' lateral:   15.63 cm/s LVOT diam:     1.80 cm      LV E/e' lateral: 6.6 LV SV:         36 LV SV Index:   16 LVOT Area:     2.54 cm  LV Volumes (MOD) LV vol d, MOD A2C: 174.0 ml LV vol d, MOD A4C: 211.0 ml LV vol s,  MOD A2C: 134.0 ml LV vol s, MOD A4C: 142.0 ml LV SV MOD A2C:     40.0 ml LV SV MOD A4C:     211.0 ml LV SV MOD BP:      64.4 ml RIGHT VENTRICLE RV Basal diam:  4.40 cm RV Mid diam:    4.00 cm RV S prime:     15.07 cm/s TAPSE (M-mode): 1.4 cm LEFT ATRIUM             Index        RIGHT ATRIUM           Index LA diam:        5.00 cm 2.24 cm/m   RA Area:     23.40 cm LA Vol (A2C):   67.7 ml 30.33 ml/m  RA Volume:   76.70 ml  34.37 ml/m LA Vol (A4C):   71.0 ml 31.81 ml/m LA Biplane Vol: 69.3 ml 31.05 ml/m  AORTIC VALVE                    PULMONIC VALVE AV Area (Vmax):    1.95 cm     PV Vmax:          1.01 m/s AV Area (Vmean):   1.89 cm     PV Peak grad:     4.1 mmHg AV Area (VTI):     1.97 cm     PR End Diast Vel: 4.24 msec AV Vmax:           103.85 cm/s AV Vmean:          71.275 cm/s AV VTI:            0.184 m AV Peak Grad:      4.3 mmHg AV Mean Grad:      2.2 mmHg LVOT Vmax:         79.63 cm/s LVOT Vmean:        53.000 cm/s LVOT VTI:          0.143 m LVOT/AV VTI ratio: 0.77  AORTA Ao Root diam: 3.60 cm Ao Asc diam:  3.60 cm MITRAL VALVE                TRICUSPID VALVE MV Area (PHT): 4.01 cm     TR Peak grad:   24.2 mmHg MV Decel Time: 189 msec     TR Vmax:        246.00 cm/s MR Peak grad: 13.7 mmHg MR Vmax:      185.00 cm/s   SHUNTS MV E velocity: 103.45 cm/s  Systemic VTI:  0.14 m MV A velocity: 42.60 cm/s   Systemic Diam: 1.80 cm MV E/A ratio:  2.43  [name] MD Electronically signed by [name] MD Signature Date/Time: 01/15/2022/4:15:24 PM    Final    CT Angio Chest PE W/Cm &/Or Wo Cm  Result Date: 01/15/2022 CLINICAL DATA:  Pulmonary embolism suspected, high probability. New AFib, pleuritic chest pain, and leg swelling. Shortness of breath and nausea. EXAM: CT ANGIOGRAPHY CHEST WITH CONTRAST TECHNIQUE: Multidetector CT imaging of the chest was performed using the standard protocol during bolus administration of intravenous contrast. Multiplanar CT image reconstructions and MIPs were obtained to  evaluate the vascular anatomy. RADIATION DOSE REDUCTION: This exam was performed according to the departmental dose-optimization program which includes automated exposure control, adjustment of the mA and/or kV according to patient size and/or use of iterative reconstruction technique. CONTRAST:  52m OMNIPAQUE IOHEXOL 350 MG/ML SOLN COMPARISON:  08/05/2020. FINDINGS: Cardiovascular: The heart is enlarged and there is no pericardial effusion. Scattered coronary artery calcifications are noted. There is atherosclerotic calcification of the aorta with aneurysmal dilatation of the ascending aorta measuring 4.5 cm. The pulmonary trunk is mildly distended suggesting underlying pulmonary artery hypertension. No pulmonary artery filling defect. Mediastinum/Nodes: No mediastinal, hilar, or axillary lymphadenopathy. Thyroid gland, trachea, and esophagus are within normal limits. Lungs/Pleura: Dependent atelectasis is present bilaterally. No effusion or pneumothorax. Upper Abdomen: Mild reflux of contrast into the inferior vena cava and hepatic veins, may be related to right heart failure. No acute abnormality. Musculoskeletal: Old rib fractures on the right. Degenerative changes in the thoracic and cervical spine. No acute osseous abnormality. Review of the MIP images confirms the above findings. IMPRESSION: 1. No evidence of pulmonary embolism or other acute process. 2. Cardiomegaly with coronary artery calcifications. 3. Aneurysmal dilatation of the ascending aorta measuring 4.5 cm. Ascending thoracic aortic aneurysm. Recommend semi-annual imaging followup by CTA or MRA and referral to cardiothoracic surgery if not already obtained. This recommendation follows 2010 ACCF/AHA/AATS/ACR/ASA/SCA/SCAI/SIR/STS/SVM Guidelines for the Diagnosis and Management of Patients With Thoracic Aortic Disease. Circulation. 2010; 121:: O270-J500 Aortic aneurysm NOS (ICD10-I71.9) 4. Distended pulmonary trunk suggesting underlying pulmonary  artery hypertension. Electronically Signed   By: LBrett FairyM.D.   On: 01/15/2022 00:24   DG Shoulder Left  Result Date: 01/14/2022 CLINICAL DATA:  Chronic pain left shoulder with decreased range of motion EXAM: LEFT SHOULDER - 2+ VIEW COMPARISON:  None Available. FINDINGS: No acute fracture or dislocation. Moderate degenerative arthritis left AC joint. Mild degenerative arthritis glenohumeral joint. Unremarkable soft tissues. IMPRESSION: No acute abnormality. Degenerative arthritis left AC and glenohumeral joints. Electronically Signed   By: TPlacido SouM.D.   On: 01/14/2022 23:04   DG Chest Port 1 View  Result Date: 01/14/2022 CLINICAL DATA:  Chest pain with radiation to the left shoulder EXAM: PORTABLE CHEST 1 VIEW COMPARISON:  CT chest 08/05/2020 FINDINGS: Borderline cardiomegaly. No focal consolidation, pleural effusion, or pneumothorax. No acute osseous abnormality. Remote right rib fractures. IMPRESSION: No active disease. Electronically Signed   By: TPlacido SouM.D.   On: 01/14/2022 23:03     Assessment and Plan:   Afib with RVR -new diagnosis this admission - started on dilt gtt, - on dilt gtt at 7.5. Start lopressor 50 mg bid  hold parameters, consolidate to toprol prior to discharge. CHADS2Vasc score is 3 (age, HTN, HF), has been started on eliquis.    2 Acute HFrEF 06/2021 LVEF 60-65% 12/2021 Echo LVEF 40-45%, mild RV dysfunction, mod BAE -suspect tachy mediated CM given presented with new onset afib with RVR - would manage medically, repeat echo 3-6 months. If ongoing  dysfunction consider ischemic testing at that time. Cardiac calcium score of 0 just in 2021   - starting beta blocker as reported above - pending bp's would initiate additional medical therapy for HR, would leave room with bp for now to prioritize titration of beta blocker given afib with RVR - some fluid overload, dose IV lasix '40mg'$  x 1 today    Really wants to go home today for Xmas holiday. I  think slim chance could get out later today as he is still on dilt gtt this AM and just getting first dose of oral lopressor today. If weaned off dilt gtt and rates controlled on oral lopressor later in the day would be reasonable for outpatient f/u to further adjust meds and HF regimen. If discharged would send out on lasix '40mg'$  prn swelling.       For questions or updates, please contact Mar[facility] Please consult [email or web address] for contact info under    Signed, Carlyle Dolly, MD  01/16/2022 9:58 AM

## 2022-01-16 NOTE — Progress Notes (Signed)
[facility] for heparin Indication: atrial fibrillation  Allergies  Allergen Reactions   Statins     Other reaction(s): Other (See Comments) Fatigue    Patient Measurements: Height: '5\' 10"'$  (177.8 cm) Weight: 103 kg (227 lb 1.6 oz) IBW/kg (Calculated) : 73 Heparin Dosing Weight: 96 Kg  Vital Signs: BP: 120/49 (12/24 0014) Pulse Rate: 87 (12/24 0014)  Labs: Recent Labs    01/14/22 2302 01/15/22 0039 01/15/22 1023 01/15/22 1847 01/16/22 0031  HGB 14.7  --   --   --  16.6  HCT 42.6  --   --   --  49.7  PLT 290  --   --   --  289  HEPARINUNFRC  --   --  0.22* 0.49 0.51  CREATININE 1.01  --   --   --  1.14  TROPONINIHS 6 6  --   --   --      Estimated Creatinine Clearance: 70.4 mL/min (by C-G formula based on SCr of 1.14 mg/dL).   Medical History: Past Medical History:  Diagnosis Date   High cholesterol    Testosterone deficiency     Assessment: 52 yoM with new onset atrial fibrillation. No anticoagulation prior to admission. CBC WNL, initially started on heparin for anticoagulation. Pharmacy has been consulted to transition to eliquis.   Hgb 16.6, Hct 49.7, no s/s of bleeding reported.  Goal of Therapy:  Therapeutic anticoagulation Monitor platelets by anticoagulation protocol: Yes   Plan:  Discontinue heparin  Start apixaban '5mg'$  PO BID Monitor CBC and s/s of bleeding  Titus Dubin, PharmD PGY1 Pharmacy Resident 01/16/2022 7:44 AM

## 2022-01-16 NOTE — Progress Notes (Signed)
Patient well tolerated walking in hallways. Pt's heart rate went up to high 140- high 150 and remains high while he was walking. However, pt's heart rate returned to normal (60-80) at resting in the room. Notified cardiology.

## 2022-01-16 NOTE — Progress Notes (Signed)
Patient took undocumented dose of methadone '135mg'$  liquid from home supply on 12/23.

## 2022-01-17 DIAGNOSIS — I4891 Unspecified atrial fibrillation: Secondary | ICD-10-CM | POA: Diagnosis not present

## 2022-01-17 LAB — CBC
HCT: 46.2 % (ref 39.0–52.0)
Hemoglobin: 15.6 g/dL (ref 13.0–17.0)
MCH: 32.5 pg (ref 26.0–34.0)
MCHC: 33.8 g/dL (ref 30.0–36.0)
MCV: 96.3 fL (ref 80.0–100.0)
Platelets: 303 10*3/uL (ref 150–400)
RBC: 4.8 MIL/uL (ref 4.22–5.81)
RDW: 12.7 % (ref 11.5–15.5)
WBC: 13.5 10*3/uL — ABNORMAL HIGH (ref 4.0–10.5)
nRBC: 0 % (ref 0.0–0.2)

## 2022-01-17 LAB — BASIC METABOLIC PANEL
Anion gap: 7 (ref 5–15)
BUN: 23 mg/dL (ref 8–23)
CO2: 26 mmol/L (ref 22–32)
Calcium: 8.9 mg/dL (ref 8.9–10.3)
Chloride: 102 mmol/L (ref 98–111)
Creatinine, Ser: 1.28 mg/dL — ABNORMAL HIGH (ref 0.61–1.24)
GFR, Estimated: 59 mL/min — ABNORMAL LOW (ref >60)
Glucose, Bld: 96 mg/dL (ref 70–99)
Potassium: 4.5 mmol/L (ref 3.5–5.1)
Sodium: 135 mmol/L (ref 135–145)

## 2022-01-17 LAB — MAGNESIUM: Magnesium: 1.9 mg/dL (ref 1.7–2.4)

## 2022-01-17 MED ORDER — METOPROLOL TARTRATE 100 MG PO TABS: 100.0000 mg | ORAL_TABLET | Freq: Two times a day (BID) | ORAL | 1 refills | Status: DC

## 2022-01-17 MED ORDER — METOPROLOL TARTRATE 100 MG PO TABS
100.0000 mg | ORAL_TABLET | Freq: Two times a day (BID) | ORAL | Status: DC
  Administered 2022-01-17: 100 mg via ORAL
  Filled 2022-01-17: qty 1

## 2022-01-17 MED ORDER — METHADONE HCL 10 MG/ML PO CONC
135.0000 mg | Freq: Once | ORAL | Status: AC
  Administered 2022-01-17: 135 mg via ORAL

## 2022-01-17 MED ORDER — FUROSEMIDE 40 MG PO TABS: 40.0000 mg | ORAL_TABLET | Freq: Two times a day (BID) | ORAL | 0 refills | Status: DC | PRN

## 2022-01-17 NOTE — Progress Notes (Signed)
Patient ambulated in hall with wife.  No complaints noted.  Heart rate/rhythm: Afib, rate controlled in the 70's

## 2022-01-17 NOTE — Progress Notes (Signed)
Rounding Note    Patient Name: Frank Carpenter Date of Encounter: 01/17/2022  [facility] Cardiologist: Dr. Quay Burow  Subjective   Patient has no complaints this morning.  He denies chest pain, shortness of breath.  Inpatient Medications    Scheduled Meds:  apixaban  5 mg Oral BID   docusate sodium  400 mg Oral BID   ezetimibe  10 mg Oral Daily   lidocaine  1 patch Transdermal Q24H   metoprolol tartrate  100 mg Oral BID   mirtazapine  15 mg Oral QHS   sodium chloride flush  3 mL Intravenous Q12H   Continuous Infusions:  diltiazem (CARDIZEM) infusion 5 mg/hr (01/16/22 2321)   PRN Meds: acetaminophen **OR** acetaminophen, albuterol, fluticasone, hydrOXYzine, ondansetron **OR** ondansetron (ZOFRAN) IV   Vital Signs    Vitals:   01/16/22 2034 01/16/22 2330 01/17/22 0031 01/17/22 0620  BP: 95/62  123/77 118/86  Pulse: 90  (!) 28 94  Resp: '18  18 18  '$ Temp: 98.5 F (36.9 C)  98.5 F (36.9 C) 98.5 F (36.9 C)  TempSrc: Oral  Oral Oral  SpO2: 97% 98% (!) 86% 94%  Weight:    102.7 kg  Height:        Intake/Output Summary (Last 24 hours) at 01/17/2022 0733 Last data filed at 01/16/2022 2354 Gross per 24 hour  Intake 603 ml  Output 1200 ml  Net -597 ml      01/17/2022    6:20 AM 01/16/2022    6:49 AM 01/15/2022    6:32 AM  Last 3 Weights  Weight (lbs) 226 lb 6.4 oz 227 lb 1.6 oz 234 lb 2.1 oz  Weight (kg) 102.694 kg 103.012 kg 106.2 kg      Telemetry    Atrial fibrillation with controlled ventricular response- Personally Reviewed  ECG    Not performed today- Personally Reviewed  Physical Exam   GEN: No acute distress.   Neck: No JVD Cardiac: Irregularly irregular, no murmurs, rubs, or gallops.  Respiratory: Clear to auscultation bilaterally. GI: Soft, nontender, non-distended  MS: No edema; No deformity. Neuro:  Nonfocal  Psych: Normal affect  Ext-trace lower extremity edema   Labs    High Sensitivity Troponin:   Recent  Labs  Lab 01/14/22 2302 01/15/22 0039  TROPONINIHS 6 6     Chemistry Recent Labs  Lab 01/14/22 2302 01/15/22 1023 01/16/22 0031 01/17/22 0043  NA 135  --  137 135  K 4.0  --  4.2 4.5  CL 99  --  102 102  CO2 25  --  26 26  GLUCOSE 102*  --  104* 96  BUN 24*  --  16 23  CREATININE 1.01  --  1.14 1.28*  CALCIUM 9.2  --  9.4 8.9  MG  --  1.7  --  1.9  GFRNONAA >60  --  >60 59*  ANIONGAP 11  --  9 7    Lipids  Recent Labs  Lab 01/15/22 1847  CHOL 151  TRIG 81  HDL 44  LDLCALC 91  CHOLHDL 3.4    Hematology Recent Labs  Lab 01/14/22 2302 01/16/22 0031 01/17/22 0043  WBC 12.6* 15.4* 13.5*  RBC 4.59 5.14 4.80  HGB 14.7 16.6 15.6  HCT 42.6 49.7 46.2  MCV 92.8 96.7 96.3  MCH 32.0 32.3 32.5  MCHC 34.5 33.4 33.8  RDW 12.5 12.8 12.7  PLT 290 289 303   Thyroid  Recent Labs  Lab 01/15/22  1023  TSH 1.219    BNP Recent Labs  Lab 01/15/22 1847  BNP 310.9*    DDimer No results for input(s): "DDIMER" in the last 168 hours.   Radiology    ECHOCARDIOGRAM COMPLETE  Result Date: 01/15/2022    ECHOCARDIOGRAM REPORT   Patient Name:   Frank Carpenter Date of Exam: 01/15/2022 Medical Rec #:  [id number]       Height:       70.0 in Accession #:    [id number]      Weight:       234.1 lb Date of Birth:  [birth year]       BSA:          2.232 m Patient Age:    71 years        BP:           116/91 mmHg Patient Gender: M               HR:           100 bpm. Exam Location:  Inpatient Procedure: 2D Echo and Intracardiac Opacification Agent Indications:    atrial fibrillation  History:        Patient has prior history of Echocardiogram examinations, most                 recent 06/25/2021. Risk Factors:Hypertension and Dyslipidemia.  Sonographer:    Harvie Junior Referring Phys: [id number] RONDELL A SMITH  Sonographer Comments: Technically difficult study due to poor echo windows and patient is obese. Image acquisition challenging due to patient behavioral factors. and Image acquisition  challenging due to respiratory motion. IMPRESSIONS  1. Left ventricular ejection fraction, by estimation, is 40 to 45%. The left ventricle has mildly decreased function. The left ventricle demonstrates global hypokinesis. The left ventricular internal cavity size was mildly dilated. Left ventricular diastolic parameters are indeterminate.  2. Right ventricular systolic function is mildly reduced. The right ventricular size is mildly enlarged. There is normal pulmonary artery systolic pressure.  3. Left atrial size was moderately dilated.  4. Right atrial size was moderately dilated.  5. The mitral valve is abnormal. Trivial mitral valve regurgitation. No evidence of mitral stenosis.  6. The aortic valve is tricuspid. There is moderate calcification of the aortic valve. There is moderate thickening of the aortic valve. Aortic valve regurgitation is not visualized. Aortic valve sclerosis is present, with no evidence of aortic valve stenosis.  7. The inferior vena cava is normal in size with greater than 50% respiratory variability, suggesting right atrial pressure of 3 mmHg. FINDINGS  Left Ventricle: Left ventricular ejection fraction, by estimation, is 40 to 45%. The left ventricle has mildly decreased function. The left ventricle demonstrates global hypokinesis. The left ventricular internal cavity size was mildly dilated. There is  no left ventricular hypertrophy. Left ventricular diastolic parameters are indeterminate. Right Ventricle: The right ventricular size is mildly enlarged. No increase in right ventricular wall thickness. Right ventricular systolic function is mildly reduced. There is normal pulmonary artery systolic pressure. The tricuspid regurgitant velocity  is 2.46 m/s, and with an assumed right atrial pressure of 3 mmHg, the estimated right ventricular systolic pressure is 24.4 mmHg. Left Atrium: Left atrial size was moderately dilated. Right Atrium: Right atrial size was moderately dilated.  Pericardium: There is no evidence of pericardial effusion. Mitral Valve: The mitral valve is abnormal. There is mild thickening of the mitral valve leaflet(s). There is mild calcification of the mitral valve leaflet(s). Trivial  mitral valve regurgitation. No evidence of mitral valve stenosis. Tricuspid Valve: The tricuspid valve is normal in structure. Tricuspid valve regurgitation is trivial. No evidence of tricuspid stenosis. Aortic Valve: The aortic valve is tricuspid. There is moderate calcification of the aortic valve. There is moderate thickening of the aortic valve. Aortic valve regurgitation is not visualized. Aortic valve sclerosis is present, with no evidence of aortic valve stenosis. Aortic valve mean gradient measures 2.2 mmHg. Aortic valve peak gradient measures 4.3 mmHg. Aortic valve area, by VTI measures 1.97 cm. Pulmonic Valve: The pulmonic valve was normal in structure. Pulmonic valve regurgitation is not visualized. No evidence of pulmonic stenosis. Aorta: The aortic root is normal in size and structure. Venous: The inferior vena cava is normal in size with greater than 50% respiratory variability, suggesting right atrial pressure of 3 mmHg. IAS/Shunts: No atrial level shunt detected by color flow Doppler.  LEFT VENTRICLE PLAX 2D LVIDd:         5.00 cm      Diastology LVIDs:         4.00 cm      LV e' medial:    11.65 cm/s LV PW:         0.90 cm      LV E/e' medial:  8.9 LV IVS:        0.90 cm      LV e' lateral:   15.63 cm/s LVOT diam:     1.80 cm      LV E/e' lateral: 6.6 LV SV:         36 LV SV Index:   16 LVOT Area:     2.54 cm  LV Volumes (MOD) LV vol d, MOD A2C: 174.0 ml LV vol d, MOD A4C: 211.0 ml LV vol s, MOD A2C: 134.0 ml LV vol s, MOD A4C: 142.0 ml LV SV MOD A2C:     40.0 ml LV SV MOD A4C:     211.0 ml LV SV MOD BP:      64.4 ml RIGHT VENTRICLE RV Basal diam:  4.40 cm RV Mid diam:    4.00 cm RV S prime:     15.07 cm/s TAPSE (M-mode): 1.4 cm LEFT ATRIUM             Index        RIGHT  ATRIUM           Index LA diam:        5.00 cm 2.24 cm/m   RA Area:     23.40 cm LA Vol (A2C):   67.7 ml 30.33 ml/m  RA Volume:   76.70 ml  34.37 ml/m LA Vol (A4C):   71.0 ml 31.81 ml/m LA Biplane Vol: 69.3 ml 31.05 ml/m  AORTIC VALVE                    PULMONIC VALVE AV Area (Vmax):    1.95 cm     PV Vmax:          1.01 m/s AV Area (Vmean):   1.89 cm     PV Peak grad:     4.1 mmHg AV Area (VTI):     1.97 cm     PR End Diast Vel: 4.24 msec AV Vmax:           103.85 cm/s AV Vmean:          71.275 cm/s AV VTI:  0.184 m AV Peak Grad:      4.3 mmHg AV Mean Grad:      2.2 mmHg LVOT Vmax:         79.63 cm/s LVOT Vmean:        53.000 cm/s LVOT VTI:          0.143 m LVOT/AV VTI ratio: 0.77  AORTA Ao Root diam: 3.60 cm Ao Asc diam:  3.60 cm MITRAL VALVE                TRICUSPID VALVE MV Area (PHT): 4.01 cm     TR Peak grad:   24.2 mmHg MV Decel Time: 189 msec     TR Vmax:        246.00 cm/s MR Peak grad: 13.7 mmHg MR Vmax:      185.00 cm/s   SHUNTS MV E velocity: 103.45 cm/s  Systemic VTI:  0.14 m MV A velocity: 42.60 cm/s   Systemic Diam: 1.80 cm MV E/A ratio:  2.43 [name] MD Electronically signed by [name] MD Signature Date/Time: 01/15/2022/4:15:24 PM    Final     Cardiac Studies   2D echocardiogram (01/15/2022)  IMPRESSIONS     1. Left ventricular ejection fraction, by estimation, is 40 to 45%. The  left ventricle has mildly decreased function. The left ventricle  demonstrates global hypokinesis. The left ventricular internal cavity size  was mildly dilated. Left ventricular  diastolic parameters are indeterminate.   2. Right ventricular systolic function is mildly reduced. The right  ventricular size is mildly enlarged. There is normal pulmonary artery  systolic pressure.   3. Left atrial size was moderately dilated.   4. Right atrial size was moderately dilated.   5. The mitral valve is abnormal. Trivial mitral valve regurgitation. No  evidence of mitral stenosis.    6. The aortic valve is tricuspid. There is moderate calcification of the  aortic valve. There is moderate thickening of the aortic valve. Aortic  valve regurgitation is not visualized. Aortic valve sclerosis is present,  with no evidence of aortic valve  stenosis.   7. The inferior vena cava is normal in size with greater than 50%  respiratory variability, suggesting right atrial pressure of 3 mmHg.   Patient Profile     JULUIS FITZSIMMONS Carpenter is a 71 y.o. male with a hx of HTN, HL, aortic aneuryrsm who is being seen 01/16/2022 for the evaluation of afib with RVR at the request of Dr Roger Shelter.   Assessment & Plan    1: Atrial fibrillation with rapid ventricular response-new diagnosis, was on heparin drip transition to Eliquis.  On diltiazem drip at 5 mg/h with intent to wean off this morning.  Beta-blocker started.  Heart rate response is now controlled.  He feels clinically improved.  If he does not convert on his own in the next 4 weeks we will arrange for outpatient DC cardioversion.  2: Acute systolic heart failure-EF was 60 to 65% by echo 06/2021.  Currently his EF is 40 to 45% was likely related to his atrial fibrillation.  He did have volume overload which responded to IV diuretics.  His I/os -1.9 L.  He feels clinically improved.  There is only trace edema on exam.  Can probably be discharged on as needed Lasix.  Clinically improved with rate control.  Would wean IV diltiazem off this morning.  He is on p.o. beta-blocker.  Can titrate for rate.  Can discontinue IV heparin as well since  on Eliquis.  From my point of view, probably okay for discharge later today.  He already has an outpatient appointment with Laurann Montana, NP this coming Wednesday.  Anticipate arranging outpatient DC cardioversion in the next 4 weeks. [place] will sign off.   Medication Recommendations: Eliquis, Lopressor, as needed furosemide Other recommendations (labs, testing, etc): None Follow up as an  outpatient: Outpatient follow-up has been arranged with Laurann Montana, NP at our droppage office this coming Wednesday.  For questions or updates, please contact [place] Please consult [email or web address] for contact info under        Signed, Quay Burow, MD  01/17/2022, 7:33 AM

## 2022-01-17 NOTE — Discharge Summary (Signed)
Physician Discharge Summary   Patient: Frank Carpenter MRN: [id number] DOB: February 22, 1950  Admit date:     01/14/2022  Discharge date: 01/17/22  Discharge Physician: Deatra James   PCP: Marda Stalker, PA-C   Patient was discharged yesterday by continue to have A-fib with RVR was held overnight  Medication was adjusted his metoprolol has been increased weaned off Cardizem drip Cleared by cardiology to discharge home today.  Recommendations at discharge:   Medication added on this admission metoprolol and Eliquis Follow with a cardiologist within 1-4 weeks Follow-up with your PCP in 2-4 weeks Continue taking current medication, with a close follow-up medication subjective change in CT imaging noted aneurysm measuring 4.5 cm.  Previously noted to be 4.2 cm back in 07/2020. Follow-up with cardiothoracic surgery in the outpatient setting and semiannual monitoring  Discharge Diagnoses: Principal Problem:   Atrial fibrillation with RVR (HCC) Active Problems:   Leukocytosis   Chronic diastolic CHF (congestive heart failure) (HCC)   Essential hypertension   Left shoulder pain   Thoracic aortic aneurysm (HCC)   OSA (obstructive sleep apnea)  Resolved Problems:   * No resolved hospital problems. *   Frank Carpenter is a 71 y.o. male with medical history significant of hypertension, hyperlipidemia, low testosterone, AAA, OSA on CPAP, and obesity who presents with complaints of chest pain.  Patient reports that he he reportedly started feeling bad about a month ago with fatigue and aches and pains right leg with swelling.  Notes that over the last month his weight is up approximately 8 pounds.  He denies having any palpitations, but had gotten at [facility] watch which had also noted that he was in atrial fibrillation sometime after the eighth of this month.  5 days ago he woke up from sleep and reported having significant pain across his chest that worsened whenever trying to take a deep  inspiratory breath.  He went to a walk-in clinic that day and found to be in  A-fib and was recommended to come to the emergency department immediately.  He has started to feel better at that time and went to [facility] long ED, but after seeing the number of people in the waiting room left.  He reports having increasing pain from left shoulder with history of a prior tear of the shoulder over 10 years ago and had been dormant until here recently.  He went to a sports medicine doctor the other day and states that he was diagnosed with a tear.  He is on methadone and states that it is not relieving his pain symptoms.  As the week is gone on patient reports that he is continue to feel weak and tired which she went to [place] emergency department the other day.   Patient does make note that he had been evaluated by Dr. Alvester Chou earlier this year for issues with dizziness and symptoms were thought to be more so related to her inner ear issue.   In the emergency department patient was noted to be afebrile with heart rates into the 1 teens in atrial fibrillation, respirations 13-26, O2 saturations documented as low as 87% and O2 saturations currently maintained on 2 L of nasal cannula oxygen.  Labs significant for WBC elevated 12.6, BUN 24, creatinine 1.01, and high-sensitivity troponins negative x 2.  CT angiogram of the chest showed no signs of PE, noted cardiomegaly with coronary artery calcifications, and aneurysmal dilation of the ascending aorta measuring 4.5 cm for which semiannual screening  is recommended and referral to cardiothoracic surgery.  Patient had been given diltiazem 10 mg IV and started on diltiazem drip, acetaminophen 650 mg, hydrocodone, and started on a heparin drip    New onset Atrial fibrillation with RVR Patient was found to be in A-fib with RVR with heart rates initially in the 120s.  High-sensitivity troponins negative x 2.  CT angiogram of the chest was obtained which did not reveal any  signs of a pulmonary embolism.  He had been started a Cardizem and heparin drip.  Chest x-ray noted no acute abnormality.CHA2DS2-VASc score =3   -Cardiology was consulted Per Dr. Harl Bowie recommendations -Cardizem and heparin drip >>> switch to p.o. metoprolol -Goal potassium at least 4 and magnesium at least 2 -Normal level of TSH -Echocardiogram reviewed: EJ EF 40-45%, global hypokinesis of left ventricle, moderately dilated right atrium, valves within normal limits with exception of moderate calcification of aortic valve with no evidence of stenosis -Cardiology has evaluated patient, agreed for patient to continue Eliquis and Metoprolol   Leukocytosis Acute.  Likely reactive WBC elevated to 12.6.  Chest x-ray showed no acute abnormality. -No signs of infection    Diastolic congestive heart failure Possibly acute on chronic.  Patient reports having weight gain of approximately 8 pounds over the last month with increasing swelling of his lower legs.   Acute HFrEF 06/2021 LVEF 60-65% 12/2021 Echo LVEF 40-45%, mild RV dysfunction, mod BAE   -Echo reviewed,EJ EF 40-45%, global hypokinesis of left ventricle, moderately dilated right atrium, valves within normal limits with exception of moderate calcification of aortic valve with no evidence of stenosis -Lasix 40 mg IV x 1, prescribed 40 mg p.o. as needed with swelling of lower extremities per cardiology recommendations  Cardiac calcium score of 0 in 2021 Cardiology would like to repeat echo in 3-6 months  Essential hypertension Blood pressures have been 105/80 -128/92. Home blood pressure regimen includes olmesartan-amlodipine-hydrochlorothiazide 40-5-12.5 mg daily. >>>  Will be discontinued -Discontinuing Cardizem gtt., initiating metoprolol 50 mg twice daily -Held home blood pressure regimen while on Cardizem gtt   Chronic pain -Continue methadone   Left shoulder pain Acute on chronic.  He had been seen by sports medicine earlier this  week and diagnosed with likely tear of the shoulder.  He reports having a tear of the shoulder that occurred over 10 years ago but had not been causing him pain until recently. -Lidocaine patch   Testosterone deficiency Patient is on testosterone injections in the outpatient setting.   Abdominal aortic aneurysm Seen on CT imaging noted aneurysm measuring 4.5 cm.  Previously noted to be 4.2 cm back in 07/2020. -Follow-up with cardiothoracic surgery in the outpatient setting and semiannual monitoring   Obstructive sleep apnea Patient's O2 saturations were reported to be as low as 87% while patient was resting.  O2 saturations improved on 2 L nasal cannula oxygen but normally patient wears CPAP at night. -Continue CPAP at night    Consultants: Cardiologist Dr. Harl Bowie Procedures performed: 2D echocardiogram Disposition: Home Diet recommendation:  Discharge Diet Orders (From admission, onward)     Start     Ordered   01/16/22 0000  Diet - low sodium heart healthy        01/16/22 1200           Cardiac diet DISCHARGE MEDICATION: Allergies as of 01/17/2022       Reactions   Statins    Other reaction(s): Other (See Comments) Fatigue  Medication List     STOP taking these medications    Olmesartan-amLODIPine-HCTZ 40-5-12.5 MG Tabs       TAKE these medications    apixaban 5 MG Tabs tablet Commonly known as: ELIQUIS Take 1 tablet (5 mg total) by mouth 2 (two) times daily for 60 doses.   [employer] 12 Hour Nasal Decongestant 120 MG 12 hr tablet Generic drug: pseudoephedrine Take 120 mg by mouth daily as needed for congestion.   docusate sodium 100 MG capsule Commonly known as: COLACE Take 400 mg by mouth 2 (two) times daily.   ezetimibe 10 MG tablet Commonly known as: ZETIA Take 10 mg by mouth daily.   fluticasone 50 MCG/ACT nasal spray Commonly known as: FLONASE Place 2 sprays into both nostrils daily as needed for allergies.   furosemide 40 MG  tablet Commonly known as: Lasix Take 1 tablet (40 mg total) by mouth 2 (two) times daily as needed for fluid or edema (Shortness of breath, gaining fluid weight 3-5 pounds in 24 hours).   hydrOXYzine 25 MG tablet Commonly known as: ATARAX Take 25 mg by mouth every 8 (eight) hours as needed for anxiety.   methadone 10 MG/ML solution Commonly known as: DOLOPHINE Take 135 mg by mouth daily.   metoprolol tartrate 100 MG tablet Commonly known as: LOPRESSOR Take 1 tablet (100 mg total) by mouth 2 (two) times daily.   mirtazapine 15 MG tablet Commonly known as: REMERON Take 15 mg by mouth at bedtime.   multivitamin capsule Take 1 capsule by mouth daily.   psyllium 28 % packet Commonly known as: METAMUCIL SMOOTH TEXTURE Take 1 packet by mouth daily as needed (constipation).   testosterone cypionate 200 MG/ML injection Commonly known as: DEPOTESTOSTERONE CYPIONATE Inject 100 mg into the muscle once a week. Thursday         Follow-up Information     Marda Stalker, PA-C Follow up.   Specialty: Family Medicine Why: Please follow up in a week. Contact information: [place] [address] [phone]                Discharge Exam: Danley Danker Weights   01/15/22 8756 01/16/22 0649 01/17/22 0620  Weight: 106.2 kg 103 kg 102.7 kg     Physical Exam:   General:  AAO x 3,  cooperative, no distress;   HEENT:  Normocephalic, PERRL, otherwise with in Normal limits   Neuro:  CNII-XII intact. , normal motor and sensation, reflexes intact   Lungs:   Clear to auscultation BL, Respirations unlabored,  No wheezes / crackles  Cardio:    Regular irregular S1/S2, RRR, No murmure, No Rubs or Gallops   Abdomen:  Soft, non-tender, bowel sounds active all four quadrants, no guarding or peritoneal signs.  Muscular  skeletal:  Limited exam -global generalized weaknesses - in bed, able to move all 4 extremities,   2+ pulses,  symmetric, No pitting edema  Skin:   Dry, warm to touch, negative for any Rashes,  Wounds: Please see nursing documentation           Condition at discharge: good  The results of significant diagnostics from this hospitalization (including imaging, microbiology, ancillary and laboratory) are listed below for reference.   Imaging Studies: ECHOCARDIOGRAM COMPLETE  Result Date: 01/15/2022    ECHOCARDIOGRAM REPORT   Patient Name:   JOELL USMAN Carpenter Date of Exam: 01/15/2022 Medical Rec #:  [id number]       Height:  70.0 in Accession #:    [id number]      Weight:       234.1 lb Date of Birth:  [birth year]       BSA:          2.232 m Patient Age:    11 years        BP:           116/91 mmHg Patient Gender: M               HR:           100 bpm. Exam Location:  Inpatient Procedure: 2D Echo and Intracardiac Opacification Agent Indications:    atrial fibrillation  History:        Patient has prior history of Echocardiogram examinations, most                 recent 06/25/2021. Risk Factors:Hypertension and Dyslipidemia.  Sonographer:    Harvie Junior Referring Phys: [id number] RONDELL A SMITH  Sonographer Comments: Technically difficult study due to poor echo windows and patient is obese. Image acquisition challenging due to patient behavioral factors. and Image acquisition challenging due to respiratory motion. IMPRESSIONS  1. Left ventricular ejection fraction, by estimation, is 40 to 45%. The left ventricle has mildly decreased function. The left ventricle demonstrates global hypokinesis. The left ventricular internal cavity size was mildly dilated. Left ventricular diastolic parameters are indeterminate.  2. Right ventricular systolic function is mildly reduced. The right ventricular size is mildly enlarged. There is normal pulmonary artery systolic pressure.  3. Left atrial size was moderately dilated.  4. Right atrial size was moderately dilated.  5. The mitral valve is abnormal. Trivial mitral valve regurgitation. No evidence of mitral  stenosis.  6. The aortic valve is tricuspid. There is moderate calcification of the aortic valve. There is moderate thickening of the aortic valve. Aortic valve regurgitation is not visualized. Aortic valve sclerosis is present, with no evidence of aortic valve stenosis.  7. The inferior vena cava is normal in size with greater than 50% respiratory variability, suggesting right atrial pressure of 3 mmHg. FINDINGS  Left Ventricle: Left ventricular ejection fraction, by estimation, is 40 to 45%. The left ventricle has mildly decreased function. The left ventricle demonstrates global hypokinesis. The left ventricular internal cavity size was mildly dilated. There is  no left ventricular hypertrophy. Left ventricular diastolic parameters are indeterminate. Right Ventricle: The right ventricular size is mildly enlarged. No increase in right ventricular wall thickness. Right ventricular systolic function is mildly reduced. There is normal pulmonary artery systolic pressure. The tricuspid regurgitant velocity  is 2.46 m/s, and with an assumed right atrial pressure of 3 mmHg, the estimated right ventricular systolic pressure is 94.5 mmHg. Left Atrium: Left atrial size was moderately dilated. Right Atrium: Right atrial size was moderately dilated. Pericardium: There is no evidence of pericardial effusion. Mitral Valve: The mitral valve is abnormal. There is mild thickening of the mitral valve leaflet(s). There is mild calcification of the mitral valve leaflet(s). Trivial mitral valve regurgitation. No evidence of mitral valve stenosis. Tricuspid Valve: The tricuspid valve is normal in structure. Tricuspid valve regurgitation is trivial. No evidence of tricuspid stenosis. Aortic Valve: The aortic valve is tricuspid. There is moderate calcification of the aortic valve. There is moderate thickening of the aortic valve. Aortic valve regurgitation is not visualized. Aortic valve sclerosis is present, with no evidence of aortic  valve stenosis. Aortic valve mean gradient measures 2.2 mmHg. Aortic valve peak gradient  measures 4.3 mmHg. Aortic valve area, by VTI measures 1.97 cm. Pulmonic Valve: The pulmonic valve was normal in structure. Pulmonic valve regurgitation is not visualized. No evidence of pulmonic stenosis. Aorta: The aortic root is normal in size and structure. Venous: The inferior vena cava is normal in size with greater than 50% respiratory variability, suggesting right atrial pressure of 3 mmHg. IAS/Shunts: No atrial level shunt detected by color flow Doppler.  LEFT VENTRICLE PLAX 2D LVIDd:         5.00 cm      Diastology LVIDs:         4.00 cm      LV e' medial:    11.65 cm/s LV PW:         0.90 cm      LV E/e' medial:  8.9 LV IVS:        0.90 cm      LV e' lateral:   15.63 cm/s LVOT diam:     1.80 cm      LV E/e' lateral: 6.6 LV SV:         36 LV SV Index:   16 LVOT Area:     2.54 cm  LV Volumes (MOD) LV vol d, MOD A2C: 174.0 ml LV vol d, MOD A4C: 211.0 ml LV vol s, MOD A2C: 134.0 ml LV vol s, MOD A4C: 142.0 ml LV SV MOD A2C:     40.0 ml LV SV MOD A4C:     211.0 ml LV SV MOD BP:      64.4 ml RIGHT VENTRICLE RV Basal diam:  4.40 cm RV Mid diam:    4.00 cm RV S prime:     15.07 cm/s TAPSE (M-mode): 1.4 cm LEFT ATRIUM             Index        RIGHT ATRIUM           Index LA diam:        5.00 cm 2.24 cm/m   RA Area:     23.40 cm LA Vol (A2C):   67.7 ml 30.33 ml/m  RA Volume:   76.70 ml  34.37 ml/m LA Vol (A4C):   71.0 ml 31.81 ml/m LA Biplane Vol: 69.3 ml 31.05 ml/m  AORTIC VALVE                    PULMONIC VALVE AV Area (Vmax):    1.95 cm     PV Vmax:          1.01 m/s AV Area (Vmean):   1.89 cm     PV Peak grad:     4.1 mmHg AV Area (VTI):     1.97 cm     PR End Diast Vel: 4.24 msec AV Vmax:           103.85 cm/s AV Vmean:          71.275 cm/s AV VTI:            0.184 m AV Peak Grad:      4.3 mmHg AV Mean Grad:      2.2 mmHg LVOT Vmax:         79.63 cm/s LVOT Vmean:        53.000 cm/s LVOT VTI:          0.143 m  LVOT/AV VTI ratio: 0.77  AORTA Ao Root diam: 3.60 cm Ao Asc diam:  3.60 cm MITRAL VALVE  TRICUSPID VALVE MV Area (PHT): 4.01 cm     TR Peak grad:   24.2 mmHg MV Decel Time: 189 msec     TR Vmax:        246.00 cm/s MR Peak grad: 13.7 mmHg MR Vmax:      185.00 cm/s   SHUNTS MV E velocity: 103.45 cm/s  Systemic VTI:  0.14 m MV A velocity: 42.60 cm/s   Systemic Diam: 1.80 cm MV E/A ratio:  2.43 [name] MD Electronically signed by [name] MD Signature Date/Time: 01/15/2022/4:15:24 PM    Final    CT Angio Chest PE W/Cm &/Or Wo Cm  Result Date: 01/15/2022 CLINICAL DATA:  Pulmonary embolism suspected, high probability. New AFib, pleuritic chest pain, and leg swelling. Shortness of breath and nausea. EXAM: CT ANGIOGRAPHY CHEST WITH CONTRAST TECHNIQUE: Multidetector CT imaging of the chest was performed using the standard protocol during bolus administration of intravenous contrast. Multiplanar CT image reconstructions and MIPs were obtained to evaluate the vascular anatomy. RADIATION DOSE REDUCTION: This exam was performed according to the departmental dose-optimization program which includes automated exposure control, adjustment of the mA and/or kV according to patient size and/or use of iterative reconstruction technique. CONTRAST:  46m OMNIPAQUE IOHEXOL 350 MG/ML SOLN COMPARISON:  08/05/2020. FINDINGS: Cardiovascular: The heart is enlarged and there is no pericardial effusion. Scattered coronary artery calcifications are noted. There is atherosclerotic calcification of the aorta with aneurysmal dilatation of the ascending aorta measuring 4.5 cm. The pulmonary trunk is mildly distended suggesting underlying pulmonary artery hypertension. No pulmonary artery filling defect. Mediastinum/Nodes: No mediastinal, hilar, or axillary lymphadenopathy. Thyroid gland, trachea, and esophagus are within normal limits. Lungs/Pleura: Dependent atelectasis is present bilaterally. No effusion or  pneumothorax. Upper Abdomen: Mild reflux of contrast into the inferior vena cava and hepatic veins, may be related to right heart failure. No acute abnormality. Musculoskeletal: Old rib fractures on the right. Degenerative changes in the thoracic and cervical spine. No acute osseous abnormality. Review of the MIP images confirms the above findings. IMPRESSION: 1. No evidence of pulmonary embolism or other acute process. 2. Cardiomegaly with coronary artery calcifications. 3. Aneurysmal dilatation of the ascending aorta measuring 4.5 cm. Ascending thoracic aortic aneurysm. Recommend semi-annual imaging followup by CTA or MRA and referral to cardiothoracic surgery if not already obtained. This recommendation follows 2010 ACCF/AHA/AATS/ACR/ASA/SCA/SCAI/SIR/STS/SVM Guidelines for the Diagnosis and Management of Patients With Thoracic Aortic Disease. Circulation. 2010; 121:: T517-O160 Aortic aneurysm NOS (ICD10-I71.9) 4. Distended pulmonary trunk suggesting underlying pulmonary artery hypertension. Electronically Signed   By: LBrett FairyM.D.   On: 01/15/2022 00:24   DG Shoulder Left  Result Date: 01/14/2022 CLINICAL DATA:  Chronic pain left shoulder with decreased range of motion EXAM: LEFT SHOULDER - 2+ VIEW COMPARISON:  None Available. FINDINGS: No acute fracture or dislocation. Moderate degenerative arthritis left AC joint. Mild degenerative arthritis glenohumeral joint. Unremarkable soft tissues. IMPRESSION: No acute abnormality. Degenerative arthritis left AC and glenohumeral joints. Electronically Signed   By: TPlacido SouM.D.   On: 01/14/2022 23:04   DG Chest Port 1 View  Result Date: 01/14/2022 CLINICAL DATA:  Chest pain with radiation to the left shoulder EXAM: PORTABLE CHEST 1 VIEW COMPARISON:  CT chest 08/05/2020 FINDINGS: Borderline cardiomegaly. No focal consolidation, pleural effusion, or pneumothorax. No acute osseous abnormality. Remote right rib fractures. IMPRESSION: No active disease.  Electronically Signed   By: TPlacido SouM.D.   On: 01/14/2022 23:03    Microbiology: Results for orders placed or performed during  the hospital encounter of 01/14/22  Resp panel by RT-PCR (RSV, Flu A&B, Covid) Anterior Nasal Swab     Status: None   Collection Time: 01/15/22  4:27 PM   Specimen: Anterior Nasal Swab  Result Value Ref Range Status   SARS Coronavirus 2 by RT PCR NEGATIVE NEGATIVE Final    Comment: (NOTE) SARS-CoV-2 target nucleic acids are NOT DETECTED.  The SARS-CoV-2 RNA is generally detectable in upper respiratory specimens during the acute phase of infection. The lowest concentration of SARS-CoV-2 viral copies this assay can detect is 138 copies/mL. A negative result does not preclude SARS-Cov-2 infection and should not be used as the sole basis for treatment or other patient management decisions. A negative result may occur with  improper specimen collection/handling, submission of specimen other than nasopharyngeal swab, presence of viral mutation(s) within the areas targeted by this assay, and inadequate number of viral copies(<138 copies/mL). A negative result must be combined with clinical observations, patient history, and epidemiological information. The expected result is Negative.  Fact Sheet for Patients:  EntrepreneurPulse.com.au  Fact Sheet for Healthcare Providers:  IncredibleEmployment.be  This test is no t yet approved or cleared by the [place] FDA and  has been authorized for detection and/or diagnosis of SARS-CoV-2 by FDA under an Emergency Use Authorization (EUA). This EUA will remain  in effect (meaning this test can be used) for the duration of the COVID-19 declaration under Section 564(b)(1) of the Act, 21 U.S.C.section 360bbb-3(b)(1), unless the authorization is terminated  or revoked sooner.       Influenza A by PCR NEGATIVE NEGATIVE Final   Influenza B by PCR NEGATIVE NEGATIVE Final     Comment: (NOTE) The Xpert Xpress SARS-CoV-2/FLU/RSV plus assay is intended as an aid in the diagnosis of influenza from Nasopharyngeal swab specimens and should not be used as a sole basis for treatment. Nasal washings and aspirates are unacceptable for Xpert Xpress SARS-CoV-2/FLU/RSV testing.  Fact Sheet for Patients: EntrepreneurPulse.com.au  Fact Sheet for Healthcare Providers: IncredibleEmployment.be  This test is not yet approved or cleared by the [place] FDA and has been authorized for detection and/or diagnosis of SARS-CoV-2 by FDA under an Emergency Use Authorization (EUA). This EUA will remain in effect (meaning this test can be used) for the duration of the COVID-19 declaration under Section 564(b)(1) of the Act, 21 U.S.C. section 360bbb-3(b)(1), unless the authorization is terminated or revoked.     Resp Syncytial Virus by PCR NEGATIVE NEGATIVE Final    Comment: (NOTE) Fact Sheet for Patients: EntrepreneurPulse.com.au  Fact Sheet for Healthcare Providers: IncredibleEmployment.be  This test is not yet approved or cleared by the [place] FDA and has been authorized for detection and/or diagnosis of SARS-CoV-2 by FDA under an Emergency Use Authorization (EUA). This EUA will remain in effect (meaning this test can be used) for the duration of the COVID-19 declaration under Section 564(b)(1) of the Act, 21 U.S.C. section 360bbb-3(b)(1), unless the authorization is terminated or revoked.  Performed at [facility], [address], [place], [place] [address]     Labs: CBC: Recent Labs  Lab 01/14/22 2302 01/16/22 0031 01/17/22 0043  WBC 12.6* 15.4* 13.5*  HGB 14.7 16.6 15.6  HCT 42.6 49.7 46.2  MCV 92.8 96.7 96.3  PLT 290 289 621   Basic Metabolic Panel: Recent Labs  Lab 01/14/22 2302 01/15/22 1023 01/16/22 0031 01/17/22 0043  NA 135  --  137 135  K 4.0  --  4.2  4.5  CL 99  --  102 102  CO2 25  --  26 26  GLUCOSE 102*  --  104* 96  BUN 24*  --  16 23  CREATININE 1.01  --  1.14 1.28*  CALCIUM 9.2  --  9.4 8.9  MG  --  1.7  --  1.9   Liver Function Tests: No results for input(s): "AST", "ALT", "ALKPHOS", "BILITOT", "PROT", "ALBUMIN" in the last 168 hours. CBG: No results for input(s): "GLUCAP" in the last 168 hours.  Discharge time spent: greater than 30 minutes.  Signed: Deatra James, MD [facility] 01/17/2022

## 2022-01-19 ENCOUNTER — Encounter (HOSPITAL_BASED_OUTPATIENT_CLINIC_OR_DEPARTMENT_OTHER): Payer: Self-pay | Admitting: Family

## 2022-01-19 ENCOUNTER — Ambulatory Visit (HOSPITAL_BASED_OUTPATIENT_CLINIC_OR_DEPARTMENT_OTHER): Payer: Medicare Other | Admitting: Family

## 2022-01-19 VITALS — BP 127/81 | HR 93 | Ht 70.0 in | Wt 223.2 lb

## 2022-01-19 DIAGNOSIS — I10 Essential (primary) hypertension: Secondary | ICD-10-CM

## 2022-01-19 DIAGNOSIS — I5022 Chronic systolic (congestive) heart failure: Secondary | ICD-10-CM | POA: Diagnosis not present

## 2022-01-19 DIAGNOSIS — I7121 Aneurysm of the ascending aorta, without rupture: Secondary | ICD-10-CM | POA: Diagnosis not present

## 2022-01-19 DIAGNOSIS — I480 Paroxysmal atrial fibrillation: Secondary | ICD-10-CM

## 2022-01-19 DIAGNOSIS — E782 Mixed hyperlipidemia: Secondary | ICD-10-CM

## 2022-01-19 DIAGNOSIS — D6859 Other primary thrombophilia: Secondary | ICD-10-CM | POA: Diagnosis not present

## 2022-01-19 MED ORDER — METOPROLOL TARTRATE 100 MG PO TABS: 100.0000 mg | ORAL_TABLET | Freq: Two times a day (BID) | ORAL | 2 refills | Status: DC

## 2022-01-19 MED ORDER — FUROSEMIDE 40 MG PO TABS: 40.0000 mg | ORAL_TABLET | Freq: Every day | ORAL | 0 refills | Status: DC | PRN

## 2022-01-19 MED ORDER — APIXABAN 5 MG PO TABS: 5.0000 mg | ORAL_TABLET | Freq: Two times a day (BID) | ORAL | 11 refills | Status: DC

## 2022-01-19 NOTE — Progress Notes (Signed)
Office Visit    Patient Name: Frank Carpenter Date of Encounter: 01/19/2022  PCP:  Marda Stalker, [place]  Cardiologist:  Quay Burow, MD  Advanced Practice Provider:  No care team member to display Electrophysiologist:  None      Chief Complaint    Frank Carpenter is a 71 y.o. male presents today for follow up after hospitalization for atrial fibrillation   Past Medical History    Past Medical History:  Diagnosis Date   High cholesterol    Testosterone deficiency    History reviewed. No pertinent surgical history.  Allergies  Allergies  Allergen Reactions   Statins     Other reaction(s): Other (See Comments) Fatigue    History of Present Illness    Frank Carpenter is a 71 y.o. male with a hx of atrial fibrillation, systolic heart failure, hypertension, hyperlipidemia with statin intolerance, thoracic aortic aneurysm, prior tobacco use, OSA last seen while hospitalized.  Prior coronary calcium score 06/2019 of 0.  Echo 06-2021 normal LVEF, grade 1 diastolic dysfunction, small ascending thoracic aortic aneurysm 42 mm.  Last seen 06/30/2021 doing overall well from a cardiac perspective with no changes at that time.  He contacted the office 01/14/2022 noting that he had been diagnosed with atrial fibrillation and urgent care recommended present to the ED but did not go due to wait times.  He was encouraged to represent to the ED due to risk for stroke. Admitted 12/22 - 01/17/2022 and he was started on metoprolol and Eliquis.  Echo with LVEF reduced 40-45%, with aortic valve sclerosis but no stenosis.  CT chest 12/2020 with aneurysmal dilation ascending aorta 4.5 cm recommended for semiannual imaging.  He presents today for follow up. Initial urgent care evaluation for dyspnea and chest tightness which he now attributes to the rapid atrial fibrillation. Feeling overall well since discharge. Does not in the morning he has a little  trouble catching his breath but resolves as he gets up and moving. No chest pain, edema, orthopnea, PND. Notes he does have a smart watch and wrist blood pressure cuff for monitoring of his HR/BP.  We reviewed echocardiogram and CT in detail. Discussed management of atrial fibrillation.  EKGs/Labs/Other Studies Reviewed:   The following studies were reviewed today:  2D echocardiogram (01/15/2022) IMPRESSIONS   1. Left ventricular ejection fraction, by estimation, is 40 to 45%. The  left ventricle has mildly decreased function. The left ventricle  demonstrates global hypokinesis. The left ventricular internal cavity size  was mildly dilated. Left ventricular  diastolic parameters are indeterminate.   2. Right ventricular systolic function is mildly reduced. The right  ventricular size is mildly enlarged. There is normal pulmonary artery  systolic pressure.   3. Left atrial size was moderately dilated.   4. Right atrial size was moderately dilated.   5. The mitral valve is abnormal. Trivial mitral valve regurgitation. No  evidence of mitral stenosis.   6. The aortic valve is tricuspid. There is moderate calcification of the  aortic valve. There is moderate thickening of the aortic valve. Aortic  valve regurgitation is not visualized. Aortic valve sclerosis is present,  with no evidence of aortic valve  stenosis.   7. The inferior vena cava is normal in size with greater than 50%  respiratory variability, suggesting right atrial pressure of 3 mmHg.   EKG:  EKG is ordered today.  The ekg ordered today demonstrates rate controlled atrial  fibrillation 93 bpm with occasional PVC. No acute ST/T wave changes.   Recent Labs: 01/15/2022: B Natriuretic Peptide 310.9; TSH 1.219 01/17/2022: BUN 23; Creatinine, Ser 1.28; Hemoglobin 15.6; Magnesium 1.9; Platelets 303; Potassium 4.5; Sodium 135  Recent Lipid Panel    Component Value Date/Time   CHOL 151 01/15/2022 1847   TRIG 81 01/15/2022 1847    HDL 44 01/15/2022 1847   CHOLHDL 3.4 01/15/2022 1847   VLDL 16 01/15/2022 1847   LDLCALC 91 01/15/2022 1847   Risk Assessment/Calculations:   CHA2DS2-VASc Score = 4   This indicates a 4.8% annual risk of stroke. The patient's score is based upon: CHF History: 1 HTN History: 1 Diabetes History: 0 Stroke History: 0 Vascular Disease History: 1 (12/2021 coronary calcification on CT) Age Score: 1 Gender Score: 0     Home Medications   Current Meds  Medication Sig   [employer] 12 HOUR NASAL DECONGESTANT 120 MG 12 hr tablet Take 120 mg by mouth daily as needed for congestion.   docusate sodium (COLACE) 100 MG capsule Take 400 mg by mouth 2 (two) times daily.   ezetimibe (ZETIA) 10 MG tablet Take 10 mg by mouth daily.   fluticasone (FLONASE) 50 MCG/ACT nasal spray Place 2 sprays into both nostrils daily as needed for allergies.   hydrOXYzine (ATARAX) 25 MG tablet Take 25 mg by mouth every 8 (eight) hours as needed for anxiety.   methadone (DOLOPHINE) 10 MG/ML solution Take 135 mg by mouth daily.   mirtazapine (REMERON) 15 MG tablet Take 15 mg by mouth at bedtime.   Multiple Vitamin (MULTIVITAMIN) capsule Take 1 capsule by mouth daily.   psyllium (METAMUCIL SMOOTH TEXTURE) 28 % packet Take 1 packet by mouth daily as needed (constipation).   testosterone cypionate (DEPOTESTOSTERONE CYPIONATE) 200 MG/ML injection Inject 100 mg into the muscle once a week. Thursday   [DISCONTINUED] apixaban (ELIQUIS) 5 MG TABS tablet Take 1 tablet (5 mg total) by mouth 2 (two) times daily for 60 doses.   [DISCONTINUED] furosemide (LASIX) 40 MG tablet Take 1 tablet (40 mg total) by mouth 2 (two) times daily as needed for fluid or edema (Shortness of breath, gaining fluid weight 3-5 pounds in 24 hours).   [DISCONTINUED] metoprolol tartrate (LOPRESSOR) 100 MG tablet Take 1 tablet (100 mg total) by mouth 2 (two) times daily.     Review of Systems      All other systems reviewed and are otherwise negative except  as noted above.  Physical Exam    VS:  BP 127/81 (BP Location: Left Arm, Patient Position: Sitting, Cuff Size: Normal)   Pulse 93   Ht '5\' 10"'$  (1.778 m)   Wt 223 lb 3.2 oz (101.2 kg)   SpO2 96%   BMI 32.03 kg/m  , BMI Body mass index is 32.03 kg/m.  Wt Readings from Last 3 Encounters:  01/19/22 223 lb 3.2 oz (101.2 kg)  01/17/22 226 lb 6.4 oz (102.7 kg)  06/30/21 231 lb 9.6 oz (105.1 kg)    GEN: Well nourished, well developed, in no acute distress. HEENT: normal. Neck: Supple, no JVD, carotid bruits, or masses. Cardiac: IRIR, no murmurs, rubs, or gallops. No clubbing, cyanosis, edema.  Radials/PT 2+ and equal bilaterally.  Respiratory:  Respirations regular and unlabored, clear to auscultation bilaterally. GI: Soft, nontender, nondistended. MS: No deformity or atrophy. Skin: Warm and dry, no rash. Neuro:  Strength and sensation are intact. Psych: Normal affect.  Assessment & Plan    Atrial fibrillation/hypercoagulable state -  New onset 12/2021 discovered at urgent care when he was feeling dyspneic. He was admitted 12/22-12/25/23 started on Eliquis, Metoprolol. Rate controlled in clinic today. Continue Metoprolol Tartrate '100mg'$  BID. Discussed PRN '50mg'$  for heart rate consistently >110bpm.  CHA2DS2-VASc Score = 4 [CHF History: 1, HTN History: 1, Diabetes History: 0, Stroke History: 0, Vascular Disease History: 1 (12/2021 coronary calcification on CT), Age Score: 1, Gender Score: 0].  Therefore, the patient's annual risk of stroke is 4.8 %.  Continue Eliquis 5 mg twice daily.  Does not meet dose reduction criteria.  Plan for nurse visit in 3 weeks for EKG.  If still in atrial fibrillation will plan for  set up for cardioversion and f/u 2 weeks post cardioversion. We discussed cardioversion and he is agreeable to proceed if still in atrial fibrillation. If returned to NSR, plan for BMP/CBC and repeat echo in 1 month with f/u OV after echo.  Shared Decision Making/Informed Consent The  risks (stroke, cardiac arrhythmias rarely resulting in the need for a temporary or permanent pacemaker, skin irritation or burns and complications associated with conscious sedation including aspiration, arrhythmia, respiratory failure and death), benefits (restoration of normal sinus rhythm) and alternatives of a direct current cardioversion were explained in detail to Frank Carpenter and he agrees to proceed.    Ascending aortic aneurysm - 07/2020 4.2 cm. 12/2021 4.5cm recommended for semi annual imaging. Plan for CT aorta in 6 months.  Continue optimal blood pressure control. Avoid fluoroquinolone.  HTN - BP well controlled. Continue current antihypertensive regimen of Metoprolol '100mg'$  BID. Olmesartan-Amlodipine-HCTZ was stopped during admission. He will monitor BP once per day at home and send [place] a log in 1 week. If BP elevated at home, consider resume Olmesartan alone.  OSA - CPAP compliance encouraged. Endorses using most often. Has had some difficulties with his mask and is working with the ordering provider regarding this.  HLD -Statin intolerant.  01/15/2022 total cholesterol 151 chondrocytes 81, HDL 44, LDL 91.  Continue Zetia.  Systolic heart [id number] LVEF 60 to 65%.  Updated echo 12/2021 LVEF 40 to 45% like related to atrial fibrillation. Plan for repeat limited echo once NSR restored. Will coordinate at follow up. He is euvolemic on exam and will continue Lasix '40mg'$  PRN for edema or weight gain 2 lbs overnight or 5 lbs in one week.        Disposition: Follow up nurse visit in 3 weeks.  Signed, Loel Dubonnet, NP 01/19/2022, 1:04 PM [place]

## 2022-01-19 NOTE — Patient Instructions (Addendum)
Medication Instructions:  Your physician has recommended you make the following change in your medication:    May take additional half tablet of the Metoprolol if your heart rate is persistently more than 110 bpm at rest for 10 minutes. Our goal is for your resting heart rate to be less than 100 bpm.   Be sure to take Furosemide (Lasix) as needed for swelling or if you gain 2 pounds overnight or 5 pounds in one week.   Remain OFF Olmesartan. Loel Dubonnet, NP will send you a message in 1 week to check in on blood pressure.   *If you need a refill on your cardiac medications before your next appointment, please call your pharmacy*   Lab Work/Testing/Procedures: Your EKG today shows rate controlled atrial fibrillation.   Recommend a CT of your aorta in 6 months.  Follow-Up: At [facility], you and your health needs are our priority.  As part of our continuing mission to provide you with exceptional heart care, we have created designated Provider Care Teams.  These Care Teams include your primary Cardiologist (physician) and Advanced Practice Providers (APPs -  Physician Assistants and Nurse Practitioners) who all work together to provide you with the care you need, when you need it.  We recommend signing up for the patient portal called "MyChart".  Sign up information is provided on this After Visit Summary.  MyChart is used to connect with patients for Virtual Visits (Telemedicine).  Patients are able to view lab/test results, encounter notes, upcoming appointments, etc.  Non-urgent messages can be sent to your provider as well.   To learn more about what you can do with MyChart, go to [email or web address].    Your next appointment:   January 16th at 1PM for nurse visit for EKG  If you are still in atrial fibrillation we will plan to schedule cardioversion at that time.  Other Instructions  Information About Your Aneurysm  One of your tests has shown an aneurysm of your  ascending aorta. The word "aneurysm" refers to a bulge in an artery (blood vessel). Most people think of them in the context of an emergency, but yours was found incidentally. At this point there is nothing you need to do from a procedure standpoint, but there are some important things to keep in mind for day-to-day life.  Mainstays of therapy for aneurysms include very good blood pressure control, healthy lifestyle, and avoiding tobacco products and street drugs. Research has raised concern that antibiotics in the fluoroquinolone class could be associated with increased risk of having an aneurysm develop or tear. This includes medicines that end in "floxacin," like Cipro or Levaquin. Make sure to discuss this information with other healthcare providers if you require antibiotics.  Since aneurysms can run in families, you should discuss your diagnosis with first degree relatives as they may need to be screened for this. Regular mild-moderate physical exercise is important, but avoid heavy lifting/weight lifting over 30lbs, chopping wood, shoveling snow or digging heavy earth with a shovel. It is best to avoid activities that cause grunting or straining (medically referred to as a "Valsalva maneuver"). This happens when a person bears down against a closed throat to increase the strength of arm or abdominal muscles. There's often a tendency to do this when lifting heavy weights, doing sit-ups, push-ups or chin-ups, etc., but it may be harmful.  This is a finding I would expect to be monitored periodically by your cardiology team. Most unruptured thoracic aortic aneurysms  cause no symptoms, so they are often found during exams for other conditions. Contact a health care provider if you develop any discomfort in your upper back, neck, abdomen, trouble swallowing, cough or hoarseness, or unexplained weight loss. Get help right away if you develop severe pain in your upper back or abdomen that may move into your  chest and arms, or any other concerning symptoms such as shortness of breath or fever.   Atrial Fibrillation  Atrial fibrillation is a type of heartbeat that is irregular or fast. If you have this condition, your heart beats without any order. This makes it hard for your heart to pump blood in a normal way. Atrial fibrillation may come and go, or it may become a long-lasting problem. If this condition is not treated, it can put you at higher risk for stroke, heart failure, and other heart problems. What are the causes? This condition may be caused by diseases that damage the heart. They include: High blood pressure. Heart failure. Heart valve disease. Heart surgery. Other causes include: Diabetes. Thyroid disease. Being overweight. Kidney disease. Sometimes the cause is not known. What increases the risk? You are more likely to develop this condition if: You are older. You smoke. You exercise often and very hard. You have a family history of this condition. You are a man. You use drugs. You drink a lot of alcohol. You have lung conditions, such as emphysema, pneumonia, or COPD. You have sleep apnea. What are the signs or symptoms? Common symptoms of this condition include: A feeling that your heart is beating very fast. Chest pain or discomfort. Feeling short of breath. Suddenly feeling light-headed or weak. Getting tired easily during activity. Fainting. Sweating. In some cases, there are no symptoms. How is this treated? Treatment for this condition depends on underlying conditions and how you feel when you have atrial fibrillation. They include: Medicines to: Prevent blood clots. Treat heart rate or heart rhythm problems. Using devices, such as a pacemaker, to correct heart rhythm problems. Doing surgery to remove the part of the heart that sends bad signals. Closing an area where clots can form in the heart (left atrial appendage). In some cases, your doctor will  treat other underlying conditions. Follow these instructions at home: Medicines Take over-the-counter and prescription medicines only as told by your doctor. Do not take any new medicines without first talking to your doctor. If you are taking blood thinners: Talk with your doctor before you take any medicines that have aspirin or NSAIDs, such as ibuprofen, in them. Take your medicine exactly as told by your doctor. Take it at the same time each day. Avoid activities that could hurt or bruise you. Follow instructions about how to prevent falls. Wear a bracelet that says you are taking blood thinners. Or, carry a card that lists what medicines you take. Lifestyle Do not use any products that have nicotine or tobacco in them. These include cigarettes, e-cigarettes, and chewing tobacco. If you need help quitting, ask your doctor. Eat heart-healthy foods. Talk with your doctor about the right eating plan for you. Exercise regularly as told by your doctor. Do not drink alcohol. Lose weight if you are overweight. Do not use drugs, including cannabis. General instructions If you have a condition that causes breathing to stop for a short period of time (apnea), treat it as told by your doctor. Keep a healthy weight. Do not use diet pills unless your doctor says they are safe for you. Diet pills  may make heart problems worse. Keep all follow-up visits as told by your doctor. This is important. Contact a doctor if: You notice a change in the speed, rhythm, or strength of your heartbeat. You are taking a blood-thinning medicine and you get more bruising. You get tired more easily when you move or exercise. You have a sudden change in weight. Get help right away if:  You have pain in your chest or your belly (abdomen). You have trouble breathing. You have side effects of blood thinners, such as blood in your vomit, poop (stool), or pee (urine), or bleeding that cannot stop. You have any signs of a  stroke. "BE FAST" is an easy way to remember the main warning signs: B - Balance. Signs are dizziness, sudden trouble walking, or loss of balance. E - Eyes. Signs are trouble seeing or a change in how you see. F - Face. Signs are sudden weakness or loss of feeling in the face, or the face or eyelid drooping on one side. A - Arms. Signs are weakness or loss of feeling in an arm. This happens suddenly and usually on one side of the body. S - Speech. Signs are sudden trouble speaking, slurred speech, or trouble understanding what people say. T - Time. Time to call emergency services. Write down what time symptoms started. You have other signs of a stroke, such as: A sudden, very bad headache with no known cause. Feeling like you may vomit (nausea). Vomiting. A seizure. These symptoms may be an emergency. Do not wait to see if the symptoms will go away. Get medical help right away. Call your local emergency services (911 in the [place] Do not drive yourself to the hospital. Summary Atrial fibrillation is a type of heartbeat that is irregular or fast. You are at higher risk of this condition if you smoke, are older, have diabetes, or are overweight. Follow your doctor's instructions about medicines, diet, exercise, and follow-up visits. Get help right away if you have signs or symptoms of a stroke. Get help right away if you cannot catch your breath, or you have chest pain or discomfort. This information is not intended to replace advice given to you by your health care provider. Make sure you discuss any questions you have with your health care provider. Document Revised: 07/04/2018 Document Reviewed: 07/04/2018 Elsevier Patient Education  [place].

## 2022-01-25 ENCOUNTER — Encounter (HOSPITAL_BASED_OUTPATIENT_CLINIC_OR_DEPARTMENT_OTHER): Payer: Self-pay

## 2022-01-26 DIAGNOSIS — G4733 Obstructive sleep apnea (adult) (pediatric): Secondary | ICD-10-CM | POA: Diagnosis not present

## 2022-01-27 DIAGNOSIS — I10 Essential (primary) hypertension: Secondary | ICD-10-CM | POA: Diagnosis not present

## 2022-01-27 DIAGNOSIS — I7121 Aneurysm of the ascending aorta, without rupture: Secondary | ICD-10-CM | POA: Diagnosis not present

## 2022-01-27 DIAGNOSIS — E7800 Pure hypercholesterolemia, unspecified: Secondary | ICD-10-CM | POA: Diagnosis not present

## 2022-01-27 DIAGNOSIS — I4891 Unspecified atrial fibrillation: Secondary | ICD-10-CM | POA: Diagnosis not present

## 2022-02-03 NOTE — Telephone Encounter (Signed)
MyChart message not read.   Called to inquire about recent BP readings.   No answer - left HIPAA compliant VM requesting call back.   Loel Dubonnet, NP

## 2022-02-04 ENCOUNTER — Telehealth: Payer: Self-pay | Admitting: Family

## 2022-02-04 NOTE — Telephone Encounter (Signed)
  Pt c/o BP issue: STAT if pt c/o blurred vision, one-sided weakness or slurred speech  1. What are your last 5 BP readings?  107/74  2. Are you having any other symptoms (ex. Dizziness, headache, blurred vision, passed out)?   3. What is your BP issue? Pt provided his most recent BP reading he can remember, he said he is not at home but will respond to Caitlin's mychart message for the rest of his BP reading

## 2022-02-04 NOTE — Telephone Encounter (Signed)
Will keep an eye out for patient response to [employer]

## 2022-02-08 ENCOUNTER — Encounter (HOSPITAL_BASED_OUTPATIENT_CLINIC_OR_DEPARTMENT_OTHER): Payer: Self-pay

## 2022-02-08 ENCOUNTER — Ambulatory Visit (INDEPENDENT_AMBULATORY_CARE_PROVIDER_SITE_OTHER): Payer: Medicare Other

## 2022-02-08 VITALS — BP 112/60 | Ht 70.0 in

## 2022-02-08 DIAGNOSIS — M25512 Pain in left shoulder: Secondary | ICD-10-CM | POA: Diagnosis not present

## 2022-02-08 DIAGNOSIS — I480 Paroxysmal atrial fibrillation: Secondary | ICD-10-CM | POA: Diagnosis not present

## 2022-02-08 NOTE — Addendum Note (Signed)
Addended by: Gerald Stabs on: 02/08/2022 03:20 PM   Modules accepted: Orders

## 2022-02-08 NOTE — Progress Notes (Signed)
   Nurse Visit   Date of Encounter: 02/08/2022 ID: Frank Carpenter, DOB December 12, 1950, MRN [id number]  PCP:  Marda Stalker, [place] [facility] Cardiologist:  Quay Burow, MD      Visit Details     Reason for visit: EKG to check for cardioversion need  Performed today: Vitals, EKG, Provider Consulted- Laurann Montana, NP Changes (medications, testing, etc.) : Patient scheduled for Cardioversion  Length of Visit: 20 minutes    Medications Adjustments/Labs and Tests Ordered: Cardioversion, CBC, BMP    Signed, Gerald Stabs, RN  02/08/2022 1:22 PM

## 2022-02-08 NOTE — Addendum Note (Signed)
Addended by: Loel Dubonnet on: 02/08/2022 05:02 PM   Modules accepted: Orders

## 2022-02-08 NOTE — Patient Instructions (Addendum)
Medication Instructions:  Your Physician recommend you continue on your current medication as directed.    *If you need a refill on your cardiac medications before your next appointment, please call your pharmacy*   Lab Work: Your physician recommends that you return for lab work during the week of 1/22  Please return for Lab work. You may come to the...   [facility] (3rd floor) [address], [place], [place] [address]  Open: 8am-Noon and 1pm-4:30pm  Please ring the doorbell on the small table when you exit the elevator and the Lab Tech will come get you  [place] [facility] [address] [place], [place], [place] [address] Open: 8am-1pm, then 2pm-4:30pm   [place]- Please see attached locations sheet stapled to your lab work with address and hours.    If you have labs (blood work) drawn today and your tests are completely normal, you will receive your results only by: [place] (if you have MyChart) OR A paper copy in the mail If you have any lab test that is abnormal or we need to change your treatment, we will call you to review the results.   Testing/Procedures:     Dear Frank Carpenter  You are scheduled for a Cardioversion on Thursday, February 1 with Dr. Johnsie Cancel.  Please arrive at the [facility] (Main Entrance A) at [facility]: [address] [place], [place] [address] at 11:00 AM.   DIET:  Nothing to eat or drink after midnight except a sip of water with medications (see medication instructions below)  MEDICATION INSTRUCTIONS: Continue taking your anticoagulant (blood thinner): Apixaban (Eliquis).   You will need to continue this after your procedure until you are told by your provider that it is safe to stop.    LABS: Labs the week of 1/22   FYI:  For your safety, and to allow [place] to monitor your vital signs accurately during the surgery/procedure we request: If you have artificial nails, gel coating,  SNS etc, please have those removed prior to your surgery/procedure. Not having the nail coverings /polish removed may result in cancellation or delay of your surgery/procedure.  You must have a responsible person to drive you home and stay in the waiting area during your procedure. Failure to do so could result in cancellation.  Bring your insurance cards.  *Special Note: Every effort is made to have your procedure done on time. Occasionally there are emergencies that occur at the hospital that may cause delays. Please be patient if a delay does occur.    Follow-Up: At [facility], you and your health needs are our priority.  As part of our continuing mission to provide you with exceptional heart care, we have created designated Provider Care Teams.  These Care Teams include your primary Cardiologist (physician) and Advanced Practice Providers (APPs -  Physician Assistants and Nurse Practitioners) who all work together to provide you with the care you need, when you need it.  We recommend signing up for the patient portal called "MyChart".  Sign up information is provided on this After Visit Summary.  MyChart is used to connect with patients for Virtual Visits (Telemedicine).  Patients are able to view lab/test results, encounter notes, upcoming appointments, etc.  Non-urgent messages can be sent to your provider as well.   To learn more about what you can do with MyChart, go to [email or web address].    Your next appointment:   2 week(s) post  cardioversion   Provider:   Quay Burow, MD  or Laurann Montana, NP

## 2022-02-09 NOTE — Telephone Encounter (Signed)
Addressed in nurse visit 02/08/22.   Loel Dubonnet, NP

## 2022-02-15 DIAGNOSIS — M5451 Vertebrogenic low back pain: Secondary | ICD-10-CM | POA: Diagnosis not present

## 2022-02-17 DIAGNOSIS — M5416 Radiculopathy, lumbar region: Secondary | ICD-10-CM | POA: Diagnosis not present

## 2022-02-17 DIAGNOSIS — I480 Paroxysmal atrial fibrillation: Secondary | ICD-10-CM | POA: Diagnosis not present

## 2022-02-17 LAB — CBC
Hematocrit: 46.7 % (ref 37.5–51.0)
Hemoglobin: 16.3 g/dL (ref 13.0–17.7)
MCH: 33.1 pg — ABNORMAL HIGH (ref 26.6–33.0)
MCHC: 34.9 g/dL (ref 31.5–35.7)
MCV: 95 fL (ref 79–97)
Platelets: 202 10*3/uL (ref 150–450)
RBC: 4.92 x10E6/uL (ref 4.14–5.80)
RDW: 12.4 % (ref 11.6–15.4)
WBC: 14.3 10*3/uL — ABNORMAL HIGH (ref 3.4–10.8)

## 2022-02-17 LAB — BASIC METABOLIC PANEL
BUN/Creatinine Ratio: 18 (ref 10–24)
BUN: 16 mg/dL (ref 8–27)
CO2: 22 mmol/L (ref 20–29)
Calcium: 9.3 mg/dL (ref 8.6–10.2)
Chloride: 99 mmol/L (ref 96–106)
Creatinine, Ser: 0.89 mg/dL (ref 0.76–1.27)
Glucose: 103 mg/dL — ABNORMAL HIGH (ref 70–99)
Potassium: 4.1 mmol/L (ref 3.5–5.2)
Sodium: 137 mmol/L (ref 134–144)
eGFR: 91 mL/min/{1.73_m2} (ref >59)

## 2022-02-21 ENCOUNTER — Inpatient Hospital Stay (HOSPITAL_BASED_OUTPATIENT_CLINIC_OR_DEPARTMENT_OTHER)
Admission: EM | Admit: 2022-02-21 | Discharge: 2022-02-25 | DRG: 291 | Disposition: A | Discharge status: Home / Self Care | Payer: Medicare Other | Attending: Internal Medicine | Admitting: Internal Medicine

## 2022-02-21 ENCOUNTER — Encounter (HOSPITAL_BASED_OUTPATIENT_CLINIC_OR_DEPARTMENT_OTHER): Payer: Self-pay | Admitting: Emergency Medicine

## 2022-02-21 ENCOUNTER — Other Ambulatory Visit: Payer: Self-pay

## 2022-02-21 ENCOUNTER — Emergency Department (HOSPITAL_BASED_OUTPATIENT_CLINIC_OR_DEPARTMENT_OTHER): Payer: Medicare Other

## 2022-02-21 ENCOUNTER — Telehealth: Payer: Self-pay | Admitting: Cardiovascular Disease

## 2022-02-21 DIAGNOSIS — E8770 Fluid overload, unspecified: Secondary | ICD-10-CM | POA: Diagnosis not present

## 2022-02-21 DIAGNOSIS — D72829 Elevated white blood cell count, unspecified: Secondary | ICD-10-CM | POA: Diagnosis not present

## 2022-02-21 DIAGNOSIS — Z79899 Other long term (current) drug therapy: Secondary | ICD-10-CM | POA: Diagnosis not present

## 2022-02-21 DIAGNOSIS — E7800 Pure hypercholesterolemia, unspecified: Secondary | ICD-10-CM | POA: Diagnosis present

## 2022-02-21 DIAGNOSIS — G8929 Other chronic pain: Secondary | ICD-10-CM | POA: Diagnosis not present

## 2022-02-21 DIAGNOSIS — Z1152 Encounter for screening for COVID-19: Secondary | ICD-10-CM

## 2022-02-21 DIAGNOSIS — I5022 Chronic systolic (congestive) heart failure: Secondary | ICD-10-CM

## 2022-02-21 DIAGNOSIS — I429 Cardiomyopathy, unspecified: Secondary | ICD-10-CM | POA: Diagnosis not present

## 2022-02-21 DIAGNOSIS — I5023 Acute on chronic systolic (congestive) heart failure: Secondary | ICD-10-CM | POA: Diagnosis present

## 2022-02-21 DIAGNOSIS — I4819 Other persistent atrial fibrillation: Secondary | ICD-10-CM | POA: Diagnosis not present

## 2022-02-21 DIAGNOSIS — G894 Chronic pain syndrome: Secondary | ICD-10-CM | POA: Diagnosis present

## 2022-02-21 DIAGNOSIS — I110 Hypertensive heart disease with heart failure: Principal | ICD-10-CM | POA: Diagnosis present

## 2022-02-21 DIAGNOSIS — Z6832 Body mass index (BMI) 32.0-32.9, adult: Secondary | ICD-10-CM

## 2022-02-21 DIAGNOSIS — I714 Abdominal aortic aneurysm, without rupture, unspecified: Secondary | ICD-10-CM | POA: Diagnosis not present

## 2022-02-21 DIAGNOSIS — Z888 Allergy status to other drugs, medicaments and biological substances status: Secondary | ICD-10-CM

## 2022-02-21 DIAGNOSIS — Z8052 Family history of malignant neoplasm of bladder: Secondary | ICD-10-CM | POA: Diagnosis not present

## 2022-02-21 DIAGNOSIS — G4733 Obstructive sleep apnea (adult) (pediatric): Secondary | ICD-10-CM | POA: Diagnosis not present

## 2022-02-21 DIAGNOSIS — I480 Paroxysmal atrial fibrillation: Secondary | ICD-10-CM | POA: Diagnosis not present

## 2022-02-21 DIAGNOSIS — E669 Obesity, unspecified: Secondary | ICD-10-CM | POA: Diagnosis present

## 2022-02-21 DIAGNOSIS — I10 Essential (primary) hypertension: Secondary | ICD-10-CM | POA: Diagnosis present

## 2022-02-21 DIAGNOSIS — I509 Heart failure, unspecified: Secondary | ICD-10-CM | POA: Diagnosis not present

## 2022-02-21 DIAGNOSIS — Z8249 Family history of ischemic heart disease and other diseases of the circulatory system: Secondary | ICD-10-CM

## 2022-02-21 DIAGNOSIS — D6859 Other primary thrombophilia: Secondary | ICD-10-CM

## 2022-02-21 DIAGNOSIS — Z7901 Long term (current) use of anticoagulants: Secondary | ICD-10-CM | POA: Diagnosis not present

## 2022-02-21 DIAGNOSIS — I4891 Unspecified atrial fibrillation: Secondary | ICD-10-CM | POA: Diagnosis present

## 2022-02-21 DIAGNOSIS — F419 Anxiety disorder, unspecified: Secondary | ICD-10-CM | POA: Diagnosis present

## 2022-02-21 DIAGNOSIS — R0602 Shortness of breath: Secondary | ICD-10-CM | POA: Diagnosis not present

## 2022-02-21 DIAGNOSIS — Z87891 Personal history of nicotine dependence: Secondary | ICD-10-CM

## 2022-02-21 DIAGNOSIS — I712 Thoracic aortic aneurysm, without rupture, unspecified: Secondary | ICD-10-CM | POA: Diagnosis present

## 2022-02-21 HISTORY — DX: Obstructive sleep apnea (adult) (pediatric): G47.33

## 2022-02-21 HISTORY — DX: Chronic atrial fibrillation, unspecified: I48.20

## 2022-02-21 HISTORY — DX: Chronic systolic (congestive) heart failure: I50.22

## 2022-02-21 LAB — COMPREHENSIVE METABOLIC PANEL
ALT: 43 U/L (ref 0–44)
AST: 25 U/L (ref 15–41)
Albumin: 3.9 g/dL (ref 3.5–5.0)
Alkaline Phosphatase: 86 U/L (ref 38–126)
Anion gap: 9 (ref 5–15)
BUN: 18 mg/dL (ref 8–23)
CO2: 27 mmol/L (ref 22–32)
Calcium: 9 mg/dL (ref 8.9–10.3)
Chloride: 102 mmol/L (ref 98–111)
Creatinine, Ser: 0.91 mg/dL (ref 0.61–1.24)
GFR, Estimated: >60 mL/min (ref >60)
Glucose, Bld: 107 mg/dL — ABNORMAL HIGH (ref 70–99)
Potassium: 4.2 mmol/L (ref 3.5–5.1)
Sodium: 138 mmol/L (ref 135–145)
Total Bilirubin: 1 mg/dL (ref 0.3–1.2)
Total Protein: 6.5 g/dL (ref 6.5–8.1)

## 2022-02-21 LAB — TROPONIN I (HIGH SENSITIVITY)
Troponin I (High Sensitivity): 5 ng/L (ref <18)
Troponin I (High Sensitivity): 5 ng/L (ref <18)

## 2022-02-21 LAB — CBC WITH DIFFERENTIAL/PLATELET
Abs Immature Granulocytes: 0.2 10*3/uL — ABNORMAL HIGH (ref 0.00–0.07)
Basophils Absolute: 0.1 10*3/uL (ref 0.0–0.1)
Basophils Relative: 1 %
Eosinophils Absolute: 0.2 10*3/uL (ref 0.0–0.5)
Eosinophils Relative: 1 %
HCT: 46.6 % (ref 39.0–52.0)
Hemoglobin: 15.7 g/dL (ref 13.0–17.0)
Immature Granulocytes: 2 %
Lymphocytes Relative: 11 %
Lymphs Abs: 1.3 10*3/uL (ref 0.7–4.0)
MCH: 32.3 pg (ref 26.0–34.0)
MCHC: 33.7 g/dL (ref 30.0–36.0)
MCV: 95.9 fL (ref 80.0–100.0)
Monocytes Absolute: 1.2 10*3/uL — ABNORMAL HIGH (ref 0.1–1.0)
Monocytes Relative: 10 %
Neutro Abs: 9.3 10*3/uL — ABNORMAL HIGH (ref 1.7–7.7)
Neutrophils Relative %: 75 %
Platelets: 254 10*3/uL (ref 150–400)
RBC: 4.86 MIL/uL (ref 4.22–5.81)
RDW: 13.3 % (ref 11.5–15.5)
WBC: 12.3 10*3/uL — ABNORMAL HIGH (ref 4.0–10.5)
nRBC: 0 % (ref 0.0–0.2)

## 2022-02-21 LAB — BRAIN NATRIURETIC PEPTIDE: B Natriuretic Peptide: 605.4 pg/mL — ABNORMAL HIGH (ref 0.0–100.0)

## 2022-02-21 LAB — RESP PANEL BY RT-PCR (RSV, FLU A&B, COVID)  RVPGX2
Influenza A by PCR: NEGATIVE
Influenza B by PCR: NEGATIVE
Resp Syncytial Virus by PCR: NEGATIVE
SARS Coronavirus 2 by RT PCR: NEGATIVE

## 2022-02-21 MED ORDER — FUROSEMIDE 40 MG PO TABS: 40.0000 mg | ORAL_TABLET | Freq: Every day | ORAL | Status: DC | PRN

## 2022-02-21 MED ORDER — MIRTAZAPINE 15 MG PO TABS
15.0000 mg | ORAL_TABLET | Freq: Every day | ORAL | Status: DC
  Administered 2022-02-22 – 2022-02-24 (×3): 15 mg via ORAL
  Filled 2022-02-21 (×4): qty 1

## 2022-02-21 MED ORDER — FUROSEMIDE 10 MG/ML IJ SOLN
60.0000 mg | Freq: Once | INTRAMUSCULAR | Status: AC
  Administered 2022-02-21: 60 mg via INTRAVENOUS
  Filled 2022-02-21 (×2): qty 6

## 2022-02-21 MED ORDER — METOPROLOL TARTRATE 100 MG PO TABS
100.0000 mg | ORAL_TABLET | Freq: Two times a day (BID) | ORAL | Status: DC
  Administered 2022-02-22 – 2022-02-25 (×7): 100 mg via ORAL
  Filled 2022-02-21 (×3): qty 1
  Filled 2022-02-21: qty 4
  Filled 2022-02-21 (×2): qty 1
  Filled 2022-02-21: qty 4
  Filled 2022-02-21: qty 1

## 2022-02-21 MED ORDER — EZETIMIBE 10 MG PO TABS: 10.0000 mg | ORAL_TABLET | Freq: Every day | ORAL | Status: DC

## 2022-02-21 MED ORDER — METHADONE HCL 10 MG/ML PO CONC
135.0000 mg | Freq: Every day | ORAL | Status: DC
  Administered 2022-02-22: 135 mg via ORAL

## 2022-02-21 MED ORDER — APIXABAN 5 MG PO TABS
5.0000 mg | ORAL_TABLET | Freq: Two times a day (BID) | ORAL | Status: DC
  Administered 2022-02-22 – 2022-02-25 (×7): 5 mg via ORAL
  Filled 2022-02-21 (×5): qty 1
  Filled 2022-02-21 (×2): qty 2
  Filled 2022-02-21: qty 1

## 2022-02-21 MED ORDER — HYDROXYZINE HCL 25 MG PO TABS: 25.0000 mg | ORAL_TABLET | Freq: Three times a day (TID) | ORAL | Status: DC | PRN

## 2022-02-21 MED ORDER — DOCUSATE SODIUM 100 MG PO CAPS
400.0000 mg | ORAL_CAPSULE | Freq: Two times a day (BID) | ORAL | Status: DC
  Administered 2022-02-22 – 2022-02-25 (×7): 400 mg via ORAL
  Filled 2022-02-21 (×9): qty 4

## 2022-02-21 NOTE — ED Notes (Addendum)
Patient's wife states she poured his urine output out before allowing [place] to measure.  States about 500 mL of Urine in urinal.  Margurite Auerbach RN notified.

## 2022-02-21 NOTE — ED Provider Notes (Signed)
[facility] Provider Note   CSN: [id number] Arrival date & time: 02/21/22  1441     History  Chief Complaint  Patient presents with   Shortness of [name] is a 72 y.o. male.  Patient here with shortness of breath, edema in his legs.  Fairly newly diagnosed A-fib he is on Eliquis.  He is due for cardioversion later this week.  He has been taking Lasix as needed.  He has noticed significant gain and weight the last day or 2 with leg swelling.  He cannot lie flat without feeling breathless.  Denies any chest pain.  Denies any cough or fever.  No weakness numbness headache chest pain abdominal pain.  The history is provided by the patient.       Home Medications Prior to Admission medications   Medication Sig Start Date End Date Taking? Authorizing Provider  apixaban (ELIQUIS) 5 MG TABS tablet Take 1 tablet (5 mg total) by mouth 2 (two) times daily for 360 doses. 01/19/22 07/18/22  Loel Dubonnet, NP  [facility] 12 HOUR NASAL DECONGESTANT 120 MG 12 hr tablet Take 120 mg by mouth daily as needed for congestion. 06/24/21   [provider]  docusate sodium (COLACE) 100 MG capsule Take 400 mg by mouth 2 (two) times daily.    [provider]  ezetimibe (ZETIA) 10 MG tablet Take 10 mg by mouth daily. 03/18/21   [provider]  fluticasone (FLONASE) 50 MCG/ACT nasal spray Place 2 sprays into both nostrils daily as needed for allergies. 02/23/21   [provider]  furosemide (LASIX) 40 MG tablet Take 1 tablet (40 mg total) by mouth daily as needed for fluid or edema (edema, weight gain of 2 lbs overnight or 5 lbs in one week). 01/19/22 02/18/22  Loel Dubonnet, NP  hydrOXYzine (ATARAX) 25 MG tablet Take 25 mg by mouth every 8 (eight) hours as needed for anxiety. 04/27/21   [provider]  methadone (DOLOPHINE) 10 MG/ML solution Take 135 mg by mouth daily.    [provider]  metoprolol  tartrate (LOPRESSOR) 100 MG tablet Take 1 tablet (100 mg total) by mouth 2 (two) times daily. 01/19/22 04/19/22  Loel Dubonnet, NP  mirtazapine (REMERON) 15 MG tablet Take 15 mg by mouth at bedtime. 11/06/21   [provider]  Multiple Vitamin (MULTIVITAMIN) capsule Take 1 capsule by mouth daily.    [provider]  psyllium (METAMUCIL SMOOTH TEXTURE) 28 % packet Take 1 packet by mouth daily as needed (constipation).    [provider]  testosterone cypionate (DEPOTESTOSTERONE CYPIONATE) 200 MG/ML injection Inject 100 mg into the muscle once a week. Thursday 04/22/19   [provider]      Allergies    Ezetimibe and Statins    Review of Systems   Review of Systems  Physical Exam Updated Vital Signs BP (!) 140/101 (BP Location: Right Arm)   Pulse 97   Temp 97.9 F (36.6 C)   Resp 18   Ht '5\' 10"'$  (1.778 m)   Wt 103.4 kg   SpO2 94%   BMI 32.71 kg/m  Physical Exam Vitals and nursing note reviewed.  Constitutional:      General: He is not in acute distress.    Appearance: He is well-developed.  HENT:     Head: Normocephalic and atraumatic.  Eyes:     Extraocular Movements: Extraocular movements intact.  Conjunctiva/sclera: Conjunctivae normal.     Pupils: Pupils are equal, round, and reactive to light.  Cardiovascular:     Rate and Rhythm: Tachycardia present. Rhythm irregular.     Heart sounds: No murmur heard. Pulmonary:     Effort: Tachypnea present. No respiratory distress.     Breath sounds: Rhonchi present.  Abdominal:     Palpations: Abdomen is soft.     Tenderness: There is no abdominal tenderness.  Musculoskeletal:        General: No swelling.     Cervical back: Normal range of motion and neck supple.     Right lower leg: Edema present.     Left lower leg: Edema present.     Comments: 3+ pitting edema bilaterally  Skin:    General: Skin is warm and dry.     Capillary Refill: Capillary refill takes less than 2 seconds.   Neurological:     Mental Status: He is alert.  Psychiatric:        Mood and Affect: Mood normal.     ED Results / Procedures / Treatments   Labs (all labs ordered are listed, but only abnormal results are displayed) Labs Reviewed  CBC WITH DIFFERENTIAL/PLATELET - Abnormal; Notable for the following components:      Result Value   WBC 12.3 (*)    Neutro Abs 9.3 (*)    Monocytes Absolute 1.2 (*)    Abs Immature Granulocytes 0.20 (*)    All other components within normal limits  COMPREHENSIVE METABOLIC PANEL - Abnormal; Notable for the following components:   Glucose, Bld 107 (*)    All other components within normal limits  BRAIN NATRIURETIC PEPTIDE - Abnormal; Notable for the following components:   B Natriuretic Peptide 605.4 (*)    All other components within normal limits  RESP PANEL BY RT-PCR (RSV, FLU A&B, COVID)  RVPGX2  TROPONIN I (HIGH SENSITIVITY)    EKG EKG Interpretation  Date/Time:  Monday February 21 2022 14:59:32 EST Ventricular Rate:  99 PR Interval:    QRS Duration: 94 QT Interval:  330 QTC Calculation: 423 R Axis:   90 Text Interpretation: Atrial fibrillation Rightward axis Abnormal ECG When compared with ECG of 14-Jan-2022 22:39, No significant change was found Confirmed by Lennice Sites ([id number]) on 02/21/2022 3:12:01 PM  Radiology DG Chest Portable 1 View  Result Date: 02/21/2022 CLINICAL DATA:  Shortness of breath. Irregular heart beat. Lower extremity edema. EXAM: PORTABLE CHEST 1 VIEW COMPARISON:  01/14/22 FINDINGS: stable cardiomediastinal contours. No pleural effusion identified. Mild diffuse interstitial edema. Asymmetric perihilar opacity in the mid right lung. Visualized osseous structures are unremarkable. IMPRESSION: 1. Mild interstitial edema. 2. Asymmetric perihilar opacity in the mid right lung which may reflect asymmetric edema versus infection. Electronically Signed   By: Kerby Moors M.D.   On: 02/21/2022 15:39    Procedures Procedures     Medications Ordered in ED Medications  furosemide (LASIX) injection 60 mg (60 mg Intravenous Given 02/21/22 1557)    ED Course/ Medical Decision Making/ A&P                             Medical Decision Making Amount and/or Complexity of Data Reviewed Labs: ordered. Radiology: ordered.  Risk Prescription drug management. Decision regarding hospitalization.   Frank Carpenter is here with shortness of breath.  History of A-fib on Eliquis, high cholesterol.  Leg edema and shortness of breath the last  few days.  He looks grossly volume overloaded on exam with 3+ pitting edema bilaterally in his legs.  No chest pain.  Differential diagnosis likely volume overload due to A-fib/possibly heart failure.  Will give dose IV Lasix.  Seems less likely to be ACS or PE or infectious process.  Will check CBC, CMP, troponin, BNP.  EKG per my review and interpretation shows rate controlled atrial fibrillation.  No ischemic changes.  Will get chest x-ray.  Anticipate admission.  Per my review and interpretation of labs is no significant anemia or electrolyte abnormality.  BNP is greater than 600.  Chest x-ray per my review and interpretation is consistent with edema and volume overload.  This fits the clinical picture.  I think he will need fairly aggressive diuresis and will admit him to medicine for further care.  This chart was dictated using voice recognition software.  Despite best efforts to proofread,  errors can occur which can change the documentation meaning.         Final Clinical Impression(s) / ED Diagnoses Final diagnoses:  Hypervolemia, unspecified hypervolemia type  Congestive heart failure, unspecified HF chronicity, unspecified heart failure type [facility])    Rx / DC Orders ED Discharge Orders     None         Lennice Sites, DO 02/21/22 1620

## 2022-02-21 NOTE — ED Notes (Signed)
Went to give patient reconciled medications and he voiced he took all his "evening" medications when arrived. Returned to pyxis. Educated to patient not to take his own medication while in the hospital.

## 2022-02-21 NOTE — Telephone Encounter (Addendum)
Patient stated he has been SOB all the time since this past Friday. Since Saturday, both legs from knee down are edematous. He sounded SOB over the phone. Recommended he go to the ED. He stated he will go to either [facility] or [facility].

## 2022-02-21 NOTE — ED Triage Notes (Signed)
Pt arrives to ED with c/o SOB, orthopnea, DOE, and leg swelling x2 days.

## 2022-02-21 NOTE — Telephone Encounter (Signed)
Pt c/o Shortness Of Breath: STAT if SOB developed within the last 24 hours or pt is noticeably SOB on the phone  1. Are you currently SOB (can you hear that pt is SOB on the phone)? Patient states he is currently SOB.  He was talking fine with me on the phone.   2. How long have you been experiencing SOB? Started day before yesterday.   3. Are you SOB when sitting or when up moving around? both  4. Are you currently experiencing any other symptoms? States he is having anxiety attacks with it.     Pt c/o swelling: STAT is pt has developed SOB within 24 hours  If swelling, where is the swelling located? From the knees down, both legs.   How much weight have you gained and in what time span? Not aware of gaining any weight. States he hasn't been able to eat.   Have you gained 3 pounds in a day or 5 pounds in a week?   Do you have a log of your daily weights (if so, list)?   Are you currently taking a fluid pill? Yes   Are you currently SOB? Yes   Have you traveled recently? no

## 2022-02-22 ENCOUNTER — Other Ambulatory Visit (HOSPITAL_COMMUNITY): Payer: Self-pay

## 2022-02-22 ENCOUNTER — Encounter (HOSPITAL_BASED_OUTPATIENT_CLINIC_OR_DEPARTMENT_OTHER): Payer: Self-pay | Admitting: Internal Medicine

## 2022-02-22 DIAGNOSIS — F419 Anxiety disorder, unspecified: Secondary | ICD-10-CM | POA: Diagnosis present

## 2022-02-22 DIAGNOSIS — Z8249 Family history of ischemic heart disease and other diseases of the circulatory system: Secondary | ICD-10-CM | POA: Diagnosis not present

## 2022-02-22 DIAGNOSIS — G8929 Other chronic pain: Secondary | ICD-10-CM | POA: Diagnosis present

## 2022-02-22 DIAGNOSIS — E669 Obesity, unspecified: Secondary | ICD-10-CM | POA: Diagnosis present

## 2022-02-22 DIAGNOSIS — I714 Abdominal aortic aneurysm, without rupture, unspecified: Secondary | ICD-10-CM | POA: Diagnosis present

## 2022-02-22 DIAGNOSIS — I509 Heart failure, unspecified: Secondary | ICD-10-CM | POA: Diagnosis present

## 2022-02-22 DIAGNOSIS — I480 Paroxysmal atrial fibrillation: Secondary | ICD-10-CM | POA: Diagnosis present

## 2022-02-22 DIAGNOSIS — Z87891 Personal history of nicotine dependence: Secondary | ICD-10-CM | POA: Diagnosis not present

## 2022-02-22 DIAGNOSIS — G894 Chronic pain syndrome: Secondary | ICD-10-CM | POA: Diagnosis present

## 2022-02-22 DIAGNOSIS — G4733 Obstructive sleep apnea (adult) (pediatric): Secondary | ICD-10-CM | POA: Diagnosis present

## 2022-02-22 DIAGNOSIS — Z888 Allergy status to other drugs, medicaments and biological substances status: Secondary | ICD-10-CM | POA: Diagnosis not present

## 2022-02-22 DIAGNOSIS — I4819 Other persistent atrial fibrillation: Secondary | ICD-10-CM

## 2022-02-22 DIAGNOSIS — I110 Hypertensive heart disease with heart failure: Secondary | ICD-10-CM | POA: Diagnosis present

## 2022-02-22 DIAGNOSIS — Z6832 Body mass index (BMI) 32.0-32.9, adult: Secondary | ICD-10-CM | POA: Diagnosis not present

## 2022-02-22 DIAGNOSIS — Z79899 Other long term (current) drug therapy: Secondary | ICD-10-CM | POA: Diagnosis not present

## 2022-02-22 DIAGNOSIS — I5023 Acute on chronic systolic (congestive) heart failure: Secondary | ICD-10-CM | POA: Diagnosis present

## 2022-02-22 DIAGNOSIS — Z8052 Family history of malignant neoplasm of bladder: Secondary | ICD-10-CM | POA: Diagnosis not present

## 2022-02-22 DIAGNOSIS — D72829 Elevated white blood cell count, unspecified: Secondary | ICD-10-CM | POA: Diagnosis present

## 2022-02-22 DIAGNOSIS — I4891 Unspecified atrial fibrillation: Secondary | ICD-10-CM | POA: Diagnosis not present

## 2022-02-22 DIAGNOSIS — E7800 Pure hypercholesterolemia, unspecified: Secondary | ICD-10-CM | POA: Diagnosis present

## 2022-02-22 DIAGNOSIS — Z7901 Long term (current) use of anticoagulants: Secondary | ICD-10-CM | POA: Diagnosis not present

## 2022-02-22 DIAGNOSIS — Z1152 Encounter for screening for COVID-19: Secondary | ICD-10-CM | POA: Diagnosis not present

## 2022-02-22 DIAGNOSIS — I429 Cardiomyopathy, unspecified: Secondary | ICD-10-CM | POA: Diagnosis present

## 2022-02-22 MED ORDER — ONDANSETRON HCL 4 MG PO TABS: 4.0000 mg | ORAL_TABLET | Freq: Four times a day (QID) | ORAL | Status: DC | PRN

## 2022-02-22 MED ORDER — ACETAMINOPHEN 325 MG PO TABS
650.0000 mg | ORAL_TABLET | Freq: Four times a day (QID) | ORAL | Status: DC | PRN
  Administered 2022-02-22: 650 mg via ORAL
  Filled 2022-02-22: qty 2

## 2022-02-22 MED ORDER — HYDRALAZINE HCL 20 MG/ML IJ SOLN: 5.0000 mg | INTRAMUSCULAR | Status: DC | PRN

## 2022-02-22 MED ORDER — FUROSEMIDE 10 MG/ML IJ SOLN: 40.0000 mg | Freq: Two times a day (BID) | INTRAMUSCULAR | Status: DC

## 2022-02-22 MED ORDER — ORAL CARE MOUTH RINSE: 15.0000 mL | OROMUCOSAL | Status: DC | PRN

## 2022-02-22 MED ORDER — FUROSEMIDE 10 MG/ML IJ SOLN
40.0000 mg | Freq: Two times a day (BID) | INTRAMUSCULAR | Status: DC
  Administered 2022-02-22 (×2): 40 mg via INTRAVENOUS
  Filled 2022-02-22 (×2): qty 4

## 2022-02-22 MED ORDER — POLYETHYLENE GLYCOL 3350 17 G PO PACK: 17.0000 g | PACK | Freq: Every day | ORAL | Status: DC | PRN

## 2022-02-22 MED ORDER — BISACODYL 5 MG PO TBEC: 5.0000 mg | DELAYED_RELEASE_TABLET | Freq: Every day | ORAL | Status: DC | PRN

## 2022-02-22 MED ORDER — ONDANSETRON HCL 4 MG/2ML IJ SOLN: 4.0000 mg | Freq: Four times a day (QID) | INTRAMUSCULAR | Status: DC | PRN

## 2022-02-22 MED ORDER — SACUBITRIL-VALSARTAN 24-26 MG PO TABS
1.0000 | ORAL_TABLET | Freq: Two times a day (BID) | ORAL | Status: DC
  Administered 2022-02-22 – 2022-02-25 (×6): 1 via ORAL
  Filled 2022-02-22 (×6): qty 1

## 2022-02-22 MED ORDER — ACETAMINOPHEN 650 MG RE SUPP: 650.0000 mg | Freq: Four times a day (QID) | RECTAL | Status: DC | PRN

## 2022-02-22 MED ORDER — METHADONE HCL 5 MG PO TABS
135.0000 mg | ORAL_TABLET | Freq: Every day | ORAL | Status: DC
  Administered 2022-02-23 – 2022-02-25 (×3): 135 mg via ORAL
  Filled 2022-02-22 (×4): qty 1

## 2022-02-22 MED ORDER — SODIUM CHLORIDE 0.9% FLUSH
3.0000 mL | Freq: Two times a day (BID) | INTRAVENOUS | Status: DC
  Administered 2022-02-22 – 2022-02-24 (×5): 3 mL via INTRAVENOUS

## 2022-02-22 NOTE — ED Notes (Signed)
Thomas at [facility] has been called. [facility] at [facility] Rm#[id number]. He will send transport asap. No ETA avail.-ABB(NS)

## 2022-02-22 NOTE — H&P (Signed)
History and Physical    Patient: Frank Carpenter MVE:[id number] DOB: 04/23/1950 DOA: 02/21/2022 DOS: the patient was seen and examined on 02/22/2022 PCP: Marda Stalker, PA-C  Patient coming from: Home - lives with wife; NOK: Wife, [phone]   Chief Complaint: SOB  HPI: Frank Carpenter is a 72 y.o. male with medical history significant of HLD, afib, AAA, OSA on CPAP, and chronic systolic CHF presenting with SOB.   He reports that he has afib and is supposed to have DCCV on 2/1. Saturday, he took his Lasix but he had LE edema and he was SOB.  It got worse Sunday and he called Dr. Kennon Holter office and he was told to go to the ER. He tried to wear his CPAP but it made him so anxious that he couldn't wear it.  He still felt terrible yesterday and he came to the ER.  He thinks he diuresed about 5L in the ER overnight and he is feeling better.  He was able to sleep some last night.  His edema is improved but still present this AM.  Dr. Gwenlyn Found is his cardiologist.       ER Course:  DB to [facility] transfer, per Dr. Wyline Copas:  Hx afib, planned for cardioversion later this week. Presented to [facility] with chf exac. Recent EF 40%. Grossly vol overloaded, received lasix in ED. Recent wt gain up to 241 lbs from 225 lbs last month. Accepted to tele-cards floor. Recommend consult Cardiology once pt arrives.      Review of Systems: As mentioned in the history of present illness. All other systems reviewed and are negative. Past Medical History:  Diagnosis Date   Atrial fibrillation, chronic (HCC)    Chronic systolic (congestive) heart failure (HCC)    High cholesterol    OSA (obstructive sleep apnea)    Testosterone deficiency    Past Surgical History:  Procedure Laterality Date   NASAL SEPTUM SURGERY  04/2021   Social History:  reports that he quit smoking about 20 years ago. His smoking use included cigarettes. He has a 15.00 pack-year smoking history. He has never used smokeless tobacco. He reports current  alcohol use of about 2.0 standard drinks of alcohol per week. He reports that he does not currently use drugs after having used the following drugs: Marijuana.  Allergies  Allergen Reactions   Ezetimibe     Other Reaction(s): Sick feeling   Statins     Other reaction(s): Other (See Comments) Fatigue    Family History  Problem Relation Age of Onset   Bladder Cancer Mother    Hypertension Father     Prior to Admission medications   Medication Sig Start Date End Date Taking? Authorizing Provider  apixaban (ELIQUIS) 5 MG TABS tablet Take 1 tablet (5 mg total) by mouth 2 (two) times daily for 360 doses. 01/19/22 07/18/22  Loel Dubonnet, NP  [facility] 12 HOUR NASAL DECONGESTANT 120 MG 12 hr tablet Take 120 mg by mouth daily as needed for congestion. 06/24/21   [provider]  docusate sodium (COLACE) 100 MG capsule Take 400 mg by mouth 2 (two) times daily.    [provider]  ezetimibe (ZETIA) 10 MG tablet Take 10 mg by mouth daily. 03/18/21   [provider]  fluticasone (FLONASE) 50 MCG/ACT nasal spray Place 2 sprays into both nostrils daily as needed for allergies. 02/23/21   [provider]  furosemide (LASIX) 40 MG tablet Take 1 tablet (40 mg total)  by mouth daily as needed for fluid or edema (edema, weight gain of 2 lbs overnight or 5 lbs in one week). 01/19/22 02/18/22  Loel Dubonnet, NP  hydrOXYzine (ATARAX) 25 MG tablet Take 25 mg by mouth every 8 (eight) hours as needed for anxiety. 04/27/21   [provider]  methadone (DOLOPHINE) 10 MG/ML solution Take 135 mg by mouth daily.    [provider]  metoprolol tartrate (LOPRESSOR) 100 MG tablet Take 1 tablet (100 mg total) by mouth 2 (two) times daily. 01/19/22 04/19/22  Loel Dubonnet, NP  mirtazapine (REMERON) 15 MG tablet Take 15 mg by mouth at bedtime. 11/06/21   [provider]  Multiple Vitamin (MULTIVITAMIN) capsule Take 1 capsule by mouth daily.    [provider]  psyllium (METAMUCIL SMOOTH TEXTURE) 28 % packet Take 1 packet by mouth daily as needed (constipation).    [provider]  testosterone cypionate (DEPOTESTOSTERONE CYPIONATE) 200 MG/ML injection Inject 100 mg into the muscle once a week. Thursday 04/22/19   [provider]    Physical Exam: Vitals:   02/22/22 0915 02/22/22 1134 02/22/22 1300 02/22/22 1430  BP: (!) 133/97 (!) 132/93  112/88  Pulse: 96 82  100  Resp:  20  18  Temp:  98.1 F (36.7 C)  98 F (36.7 C)  TempSrc:  Oral  Oral  SpO2:  94% 92% 95%  Weight:  103.8 kg    Height:  '5\' 10"'$  (1.778 m)     General:  Appears calm and comfortable and is in NAD Eyes:  EOMI, normal lids, iris ENT:  grossly normal hearing, lips & tongue, mmm Neck:  no LAD, masses or thyromegaly; scar from prior trach (had epiglottitis requiring trach at age 15yo) Cardiovascular:  Irregularly irregular but rate controlled, no m/r/g. 1-2+ LE edema.  Respiratory:   CTA bilaterally with no wheezes/rales/rhonchi.  Normal respiratory effort. Abdomen:  soft, NT, ND Skin:  no rash or induration seen on limited exam Musculoskeletal:  grossly normal tone BUE/BLE, good ROM, no bony abnormality Psychiatric:  grossly normal mood and affect, speech fluent and appropriate, AOx3 Neurologic:  CN 2-12 grossly intact, moves all extremities in coordinated fashion   Radiological Exams on Admission: Independently reviewed - see discussion in A/P where applicable  DG Chest Portable 1 View  Result Date: 02/21/2022 CLINICAL DATA:  Shortness of breath. Irregular heart beat. Lower extremity edema. EXAM: PORTABLE CHEST 1 VIEW COMPARISON:  01/14/22 FINDINGS: stable cardiomediastinal contours. No pleural effusion identified. Mild diffuse interstitial edema. Asymmetric perihilar opacity in the mid right lung. Visualized osseous structures are unremarkable. IMPRESSION: 1. Mild interstitial edema. 2. Asymmetric perihilar opacity in the mid right lung which may  reflect asymmetric edema versus infection. Electronically Signed   By: Kerby Moors M.D.   On: 02/21/2022 15:39    EKG: Independently reviewed.  Afib with rate 99; nonspecific ST changes with no evidence of acute ischemia   Labs on Admission: I have personally reviewed the available labs and imaging studies at the time of the admission.  Pertinent labs:    Glucose 107 BNP 605.4; 310.9 on 12/23 HS troponin 5, 5 WBC 12.3 COVID/flu/RSV negative   Assessment and Plan: Principal Problem:   Acute on chronic systolic (congestive) heart failure (HCC) Active Problems:   Atrial fibrillation with RVR (HCC)   Essential hypertension   Thoracic aortic aneurysm (HCC)   OSA (obstructive sleep apnea)   Chronic pain disorder   Anxiety    Acute  on chronic systolic CHF -Patient with known h/o chronic systolic CHF presenting with worsening edema (EF was 40-45% on 01/15/22), 15-20 pound weight gain, SOB -CXR consistent with mild pulmonary edema -Elevated BNP compared with prior -With elevated BNP and abnl CXR, acute decompensated CHF seems probable as diagnosis -Will admit, as per the Emergency HF Mortality Risk Grade.  The patient has: cardiac arrhythmia (see below) -CHF order set utilized -Was given Lasix 40 mg x 1 in ER and will repeat with 40 mg IV BID -Continue [place] O2 for now prn -Normal kidney function at this time, will follow -Negative troponin x 2 -Will consult cardiology  Afib -Patient with known afib, planned for DCCV on 2/1.  -Despite normal rate, his afib is likely driving his CHF and cardioversion is anticipated to improve this situation -HS troponin negative; will not repeat. -CHA2DS2-VASc Score is >2; will continue Eliquis. -Cardiology was notified of admission and requested to move DCCV to tomorrow if possible, but it appears that scheduling will not allow this and so DCCV is likely to be on 2/1 as an inpatient with possible dc later that day  Chronic pain -I have  reviewed this patient in the [place] Controlled Substances Reporting System.   -He is receiving medications from the methadone clinic and these do not appear in the registry, but he is not routinely receiving other narcotics -Will continue methadone at home dose  HTN -Continue metoprolol -Will also add prn hydralazine  Anxiety -Continue hydroxyzine, mirtazepine  AAA -4.5 cm in 12/2021 -Followed by cardiology -Recommended for semi-annual imaging, due again in 06/2022  OSA -Continue CPAP     Advance Care Planning:   Code Status: Full Code - Code status was discussed with the patient and/or family at the time of admission.  The patient would want to receive full resuscitative measures at this time.   Consults: Cardiology; CHF navigator; TOC team; nutrition  DVT Prophylaxis: Eliquis  Family Communication: None present; he declined having me call his wife at the time of admission  Severity of Illness: The appropriate patient status for this patient is INPATIENT. Inpatient status is judged to be reasonable and necessary in order to provide the required intensity of service to ensure the patient's safety. The patient's presenting symptoms, physical exam findings, and initial radiographic and laboratory data in the context of their chronic comorbidities is felt to place them at high risk for further clinical deterioration. Furthermore, it is not anticipated that the patient will be medically stable for discharge from the hospital within 2 midnights of admission.   * I certify that at the point of admission it is my clinical judgment that the patient will require inpatient hospital care spanning beyond 2 midnights from the point of admission due to high intensity of service, high risk for further deterioration and high frequency of surveillance required.*  Author: Karmen Bongo, MD 02/22/2022 3:08 PM  For on call review [email or web address].

## 2022-02-22 NOTE — Progress Notes (Signed)
   Heart Failure Stewardship Pharmacist Progress Note   PCP: Marda Stalker, PA-C PCP-Cardiologist: Quay Burow, MD    HPI:  72 yo M with PMH of afib, HFrEF, HTN, HLD, thoracic aortic aneurysm, tobacco use, and OSA.  Admitted from 12/22-12/25 with new onset afib RVR. ECHO at that time showed EF 40-45%, global hypokinesis, RV mildly reduced. Remained in afib at outpatient follow up and was referred for outpatient cardioversion (scheduled for 02/24/22).  Presented to the ED on 1/29 with shortness of breath, weight gain, and LE edema. CXR with mild interstitial edema. Still plan for cardioversion on 2/1.   Current HF Medications: Diuretic: furosemide 40 mg IV BID Beta Blocker: metoprolol tartrate 100 mg BID  Prior to admission HF Medications: Diuretic: furosemide 40 mg daily PRN Beta blocker: metoprolol tartrate 100 mg BID  Pertinent Lab Values: Serum creatinine 0.91, BUN 18, Potassium 4.2, Sodium 138, BNP 605.4  Vital Signs: Weight: 228 lbs (admission weight: 228 lbs) Blood pressure: 130/90s  Heart rate: 80-90s  I/O: -4.1L yesterday; net -5.3L  Medication Assistance / Insurance Benefits Check: Does the patient have prescription insurance?  Yes Type of insurance plan: UHC Medicare  Does the patient qualify for medication assistance through manufacturers or grants?   Pending Eligible grants and/or patient assistance programs: pending Medication assistance applications in progress: none  Medication assistance applications approved: none Approved medication assistance renewals will be completed by: pending  Outpatient Pharmacy:  [facility] the patient willing to use [facility] at discharge? Yes Is the patient willing to transition their outpatient pharmacy to utilize a [facility] outpatient pharmacy?   Pending    Assessment: 1. Acute on chronic systolic CHF (LVEF 85-63%), due to presumed NICM. NYHA class III symptoms. -  Continue furosemide 40 mg IV BID. Strict I/Os and daily weights. Keep K>4. Check Mg. - Continue metoprolol tartrate 100 mg BID - Consider starting Entresto 24/26 mg BID  - Consider starting MRA and SGLT2i prior to discharge  Plan: 1) Medication changes recommended at this time: - Start Entresto 24/26 mg BID  2) Patient assistance: Delene Loll copay $47 Wilder Glade copay $47 (generic not on insurance formulary) - Jardiance copay $47  3)  Education  - To be completed prior to discharge  Kerby Nora, PharmD, BCPS Heart Failure Cytogeneticist Phone [phone]

## 2022-02-22 NOTE — Consult Note (Signed)
Cardiology Consultation   Patient ID: Frank Carpenter MRN: [id number]; DOB: 1950/05/26  Admit date: 02/21/2022 Date of Consult: 02/22/2022  PCP:  Marda Stalker, [place] Prov[facility] Cardiologist:  Quay Burow, MD      Patient Profile:   Frank Carpenter is a 72 y.o. male with a hx of atrial fibrillation, HFrEF, HTN, HLD, thoracic aortic aneurysm, tobacco use, and OSA who is being seen 02/22/2022 for the evaluation of CHF at the request of Dr. Lorin Mercy.  History of Present Illness:   Mr. Frank Carpenter is a 72 yo male with PMH noted above who is followed by Dr. Gwenlyn Found as an outpatient. Echo 06/2021 with normal LVEF, g1DD, small ascending thoracic aortic aneurysm of 15m. Seen in the office 06/2021 and doing well.   He called the office 12/2021 noting he had been diagnosed with atrial fibrillation at an urgent care and was advised to go to the ED.  He did not do so secondary to wait times.  He was advised to present to the ED per office staff.  He was admitted 12/22-12/25 2023 and started on metoprolol and Eliquis.  Echocardiogram during that admission showed LVEF of 40 to 45% with aortic valve atherosclerosis but no stenosis.   He was seen back in the office for follow-up on 01/19/2022 and feeling well since discharge.  He was instructed to continue his metoprolol 100 mg twice daily with as needed use as well as Eliquis 5 mg daily with plans for repeat nurse visit in 3 weeks for EKG.  He was noted to remain in atrial fibrillation at this recheck and was set up for outpatient cardioversion.  Patient called the office on 1/29 and reported he had been short of breath for several days and had lower extremity edema.  He was advised to go to the ED for further evaluation.  ED labs showed sodium 138, potassium 4.2, creatinine 0.9, BNP 605, high-sensitivity troponin 5>>5, WBC 12.3, hemoglobin 15.7.  Chest x-ray with mild interstitial edema with mid right lung opacity of asymmetric  edema versus infection.  EKG showed atrial fibrillation, 99 bpm.  [facility]  Cardiology has been asked to evaluate.   Past Medical History:  Diagnosis Date   Atrial fibrillation, chronic (HCC)    Chronic systolic (congestive) heart failure (HCC)    High cholesterol    OSA (obstructive sleep apnea)    Testosterone deficiency     Past Surgical History:  Procedure Laterality Date   NASAL SEPTUM SURGERY  04/2021     Home Medications:  Prior to Admission medications   Medication Sig Start Date End Date Taking? Authorizing Provider  apixaban (ELIQUIS) 5 MG TABS tablet Take 1 tablet (5 mg total) by mouth 2 (two) times daily for 360 doses. 01/19/22 07/18/22 Yes WLoel Dubonnet NP  [facility] 12 HOUR NASAL DECONGESTANT 120 MG 12 hr tablet Take 120 mg by mouth daily as needed for congestion. 06/24/21  Yes [provider]  docusate sodium (COLACE) 100 MG capsule Take 400 mg by mouth 2 (two) times daily.   Yes [provider]  fluticasone (FLONASE) 50 MCG/ACT nasal spray Place 2 sprays into both nostrils daily as needed for allergies. 02/23/21  Yes [provider]  furosemide (LASIX) 40 MG tablet Take 1 tablet (40 mg total) by mouth daily as needed for fluid or edema (edema, weight gain of 2 lbs overnight or  5 lbs in one week). 01/19/22 02/22/22 Yes Loel Dubonnet, NP  hydrOXYzine (ATARAX) 25 MG tablet Take 25 mg by mouth every 8 (eight) hours as needed for anxiety. 04/27/21  Yes [provider]  methadone (DOLOPHINE) 10 MG/ML solution Take 135 mg by mouth daily.   Yes [provider]  metoprolol tartrate (LOPRESSOR) 100 MG tablet Take 1 tablet (100 mg total) by mouth 2 (two) times daily. 01/19/22 04/19/22 Yes Loel Dubonnet, NP  mirtazapine (REMERON) 15 MG tablet Take 15 mg by mouth at bedtime. 11/06/21  Yes [provider]  Multiple Vitamin (MULTIVITAMIN) capsule Take 1 capsule by  mouth daily.   Yes [provider]  Olmesartan-amLODIPine-HCTZ 40-5-12.5 MG TABS Take 1 tablet by mouth daily. 02/15/22  Yes [provider]  psyllium (METAMUCIL SMOOTH TEXTURE) 28 % packet Take 1 packet by mouth daily as needed (constipation).   Yes [provider]  testosterone cypionate (DEPOTESTOSTERONE CYPIONATE) 200 MG/ML injection Inject 100 mg into the muscle once a week. Thursday 04/22/19  Yes [provider]    Inpatient Medications: Scheduled Meds:  apixaban  5 mg Oral BID   docusate sodium  400 mg Oral BID   furosemide  40 mg Intravenous BID   methadone  135 mg Oral Daily   metoprolol tartrate  100 mg Oral BID   mirtazapine  15 mg Oral QHS   sodium chloride flush  3 mL Intravenous Q12H   Continuous Infusions:  PRN Meds: acetaminophen **OR** acetaminophen, bisacodyl, hydrALAZINE, hydrOXYzine, ondansetron **OR** ondansetron (ZOFRAN) IV, polyethylene glycol  Allergies:    Allergies  Allergen Reactions   Ezetimibe     Other Reaction(s): Sick feeling   Statins     Other reaction(s): Other (See Comments) Fatigue    Social History:   Social History   Socioeconomic History   Marital status: Married    Spouse name: Not on file   Number of children: Not on file   Years of education: Not on file   Highest education level: Not on file  Occupational History   Occupation: retired  Tobacco Use   Smoking status: Former    Packs/day: 1.00    Years: 15.00    Total pack years: 15.00    Types: Cigarettes    Quit date: 2004    Years since quitting: 20.0   Smokeless tobacco: Never  Substance and Sexual Activity   Alcohol use: Yes    Alcohol/week: 2.0 standard drinks of alcohol    Types: 2 Cans of beer per week    Comment: rare   Drug use: Not Currently    Types: Marijuana    Comment: hydrocodone prescribed for pain   Sexual activity: Not on file  Other Topics Concern   Not on file  Social History Narrative   Not on file   Social  Determinants of Health   Financial Resource Strain: Not on file  Food Insecurity: Not on file  Transportation Needs: Not on file  Physical Activity: Not on file  Stress: Not on file  Social Connections: Not on file  Intimate Partner Violence: Not on file    Family History:    Family History  Problem Relation Age of Onset   Bladder Cancer Mother    Hypertension Father      ROS:  Please see the history of present illness.   All other ROS reviewed and negative.     Physical Exam/Data:   Vitals:   02/22/22 5956 02/22/22 0800 02/22/22 3875  02/22/22 0915  BP: 136/83 (!) 133/95 (!) 133/97 (!) 133/97  Pulse: (!) 58 77 97 96  Resp: (!) '22 15 20   '$ Temp:   97.7 F (36.5 C)   TempSrc:   Oral   SpO2: 96% 94% 96%   Weight:      Height:        Intake/Output Summary (Last 24 hours) at 02/22/2022 1125 Last data filed at 02/22/2022 0500 Gross per 24 hour  Intake --  Output 4750 ml  Net -4750 ml      02/21/2022    3:01 PM 01/19/2022   11:07 AM 01/17/2022    6:20 AM  Last 3 Weights  Weight (lbs) 228 lb 223 lb 3.2 oz 226 lb 6.4 oz  Weight (kg) 103.42 kg 101.243 kg 102.694 kg     Body mass index is 32.71 kg/m.  General:  Well nourished, well developed, in no acute distress HEENT: normal Neck: no JVD Vascular: No carotid bruits; Distal pulses 2+ bilaterally Cardiac:  normal S1, S2; Irreg Irreg; no murmur Lungs:  clear to auscultation bilaterally, no wheezing, rhonchi or rales  Abd: soft, nontender, no hepatomegaly  Ext: 2+ bilateral LE edema Musculoskeletal:  No deformities, BUE and BLE strength normal and equal Skin: warm and dry  Neuro:  CNs 2-12 intact, no focal abnormalities noted Psych:  Normal affect   EKG:  The EKG was personally reviewed and demonstrates: Atrial fibrillation, 99 bpm Telemetry:  Telemetry was personally reviewed and demonstrates:  Afib rates 90-100s  Relevant CV Studies:  Echo: 12/2021  IMPRESSIONS     1. Left ventricular ejection fraction,  by estimation, is 40 to 45%. The  left ventricle has mildly decreased function. The left ventricle  demonstrates global hypokinesis. The left ventricular internal cavity size  was mildly dilated. Left ventricular  diastolic parameters are indeterminate.   2. Right ventricular systolic function is mildly reduced. The right  ventricular size is mildly enlarged. There is normal pulmonary artery  systolic pressure.   3. Left atrial size was moderately dilated.   4. Right atrial size was moderately dilated.   5. The mitral valve is abnormal. Trivial mitral valve regurgitation. No  evidence of mitral stenosis.   6. The aortic valve is tricuspid. There is moderate calcification of the  aortic valve. There is moderate thickening of the aortic valve. Aortic  valve regurgitation is not visualized. Aortic valve sclerosis is present,  with no evidence of aortic valve  stenosis.   7. The inferior vena cava is normal in size with greater than 50%  respiratory variability, suggesting right atrial pressure of 3 mmHg.   FINDINGS   Left Ventricle: Left ventricular ejection fraction, by estimation, is 40  to 45%. The left ventricle has mildly decreased function. The left  ventricle demonstrates global hypokinesis. The left ventricular internal  cavity size was mildly dilated. There is   no left ventricular hypertrophy. Left ventricular diastolic parameters  are indeterminate.   Right Ventricle: The right ventricular size is mildly enlarged. No  increase in right ventricular wall thickness. Right ventricular systolic  function is mildly reduced. There is normal pulmonary artery systolic  pressure. The tricuspid regurgitant velocity   is 2.46 m/s, and with an assumed right atrial pressure of 3 mmHg, the  estimated right ventricular systolic pressure is 08.1 mmHg.   Left Atrium: Left atrial size was moderately dilated.   Right Atrium: Right atrial size was moderately dilated.   Pericardium: There is  no evidence of  pericardial effusion.   Mitral Valve: The mitral valve is abnormal. There is mild thickening of  the mitral valve leaflet(s). There is mild calcification of the mitral  valve leaflet(s). Trivial mitral valve regurgitation. No evidence of  mitral valve stenosis.   Tricuspid Valve: The tricuspid valve is normal in structure. Tricuspid  valve regurgitation is trivial. No evidence of tricuspid stenosis.   Aortic Valve: The aortic valve is tricuspid. There is moderate  calcification of the aortic valve. There is moderate thickening of the  aortic valve. Aortic valve regurgitation is not visualized. Aortic valve  sclerosis is present, with no evidence of  aortic valve stenosis. Aortic valve mean gradient measures 2.2 mmHg.  Aortic valve peak gradient measures 4.3 mmHg. Aortic valve area, by VTI  measures 1.97 cm.   Pulmonic Valve: The pulmonic valve was normal in structure. Pulmonic valve  regurgitation is not visualized. No evidence of pulmonic stenosis.   Aorta: The aortic root is normal in size and structure.   Venous: The inferior vena cava is normal in size with greater than 50%  respiratory variability, suggesting right atrial pressure of 3 mmHg.   IAS/Shunts: No atrial level shunt detected by color flow Doppler.    Laboratory Data:  High Sensitivity Troponin:   Recent Labs  Lab 02/21/22 1515 02/21/22 1816  TROPONINIHS 5 5     Chemistry Recent Labs  Lab 02/17/22 0918 02/21/22 1515  NA 137 138  K 4.1 4.2  CL 99 102  CO2 22 27  GLUCOSE 103* 107*  BUN 16 18  CREATININE 0.89 0.91  CALCIUM 9.3 9.0  GFRNONAA  --  >60  ANIONGAP  --  9    Recent Labs  Lab 02/21/22 1515  PROT 6.5  ALBUMIN 3.9  AST 25  ALT 43  ALKPHOS 86  BILITOT 1.0   Lipids No results for input(s): "CHOL", "TRIG", "HDL", "LABVLDL", "LDLCALC", "CHOLHDL" in the last 168 hours.  Hematology Recent Labs  Lab 02/17/22 0918 02/21/22 1515  WBC 14.3* 12.3*  RBC 4.92 4.86  HGB  16.3 15.7  HCT 46.7 46.6  MCV 95 95.9  MCH 33.1* 32.3  MCHC 34.9 33.7  RDW 12.4 13.3  PLT 202 254   Thyroid No results for input(s): "TSH", "FREET4" in the last 168 hours.  BNP Recent Labs  Lab 02/21/22 1515  BNP 605.4*    DDimer No results for input(s): "DDIMER" in the last 168 hours.   Radiology/Studies:  DG Chest Portable 1 View  Result Date: 02/21/2022 CLINICAL DATA:  Shortness of breath. Irregular heart beat. Lower extremity edema. EXAM: PORTABLE CHEST 1 VIEW COMPARISON:  01/14/22 FINDINGS: stable cardiomediastinal contours. No pleural effusion identified. Mild diffuse interstitial edema. Asymmetric perihilar opacity in the mid right lung. Visualized osseous structures are unremarkable. IMPRESSION: 1. Mild interstitial edema. 2. Asymmetric perihilar opacity in the mid right lung which may reflect asymmetric edema versus infection. Electronically Signed   By: Kerby Moors M.D.   On: 02/21/2022 15:39     Assessment and Plan:   Frank Carpenter is a 72 y.o. male with a hx of atrial fibrillation, HFrEF, HTN, HLD, thoracic aortic aneurysm, tobacco use, and OSA who is being seen 02/22/2022 for the evaluation of CHF at the request of Dr. Lorin Mercy.  Atrial fibrillation -- Initially diagnosed 12/2021 placed on metoprolol and Eliquis.  Recently seen in the office with plans to do outpatient cardioversion 2/1.  Presented back with dyspnea on exertion.  He remains in atrial fibrillation with  rates reasonably controlled.  -- Continue metoprolol 100 mg twice daily, Eliquis 5 mg twice daily -- Will keep on schedule for cardioversion on Thursday as he has not missed any doses of Eliquis he will not need a TEE.   HFrEF -- Echo 12/2021 showed LVEF of 40 to 45%.  He was given as needed diuretic to use at home, use several days prior to presenting to the ED without significant improvement. -- BNP 605, chest x-ray with edema and he does have signs of volume overload on exam with lower extremity  edema -- Continue IV Lasix 40 mg twice daily -- Continue metoprolol 100 mg twice daily -- plan for repeat outpatient echo once back in [facility]  Hypertension -- Stable -- Continue metoprolol 100 mg twice daily  Hyperlipidemia -- Hx of statin and Zetia intolerance   Risk Assessment/Risk Scores:   New York Heart Association (NYHA) Functional Class NYHA Class II  CHA2DS2-VASc Score = 4   This indicates a 4.8% annual risk of stroke. The patient's score is based upon: CHF History: 1 HTN History: 1 Diabetes History: 0 Stroke History: 0 Vascular Disease History: 1 (12/2021 coronary calcification on CT) Age Score: 1 Gender Score: 0    For questions or updates, please contact [place] Please consult [email or web address] for contact info under    Signed, Reino Bellis, NP  02/22/2022 11:25 AM

## 2022-02-22 NOTE — ED Notes (Signed)
Lasix 40 mg administered IV.

## 2022-02-22 NOTE — Progress Notes (Signed)
Patient transferred from [facility] ED at 1134hrs. Oriented to room and plan of care for shift.  Patient verbalized understanding.

## 2022-02-22 NOTE — ED Notes (Signed)
Patient requesting lasix. Not scheduled till 2200 this evening. Spoke to [employer] in [place], sports. She will follow up with Dr Lorin Mercy.

## 2022-02-22 NOTE — Progress Notes (Signed)
Heart Failure Navigator Progress Note  Assessed for Heart & Vascular TOC clinic readiness.  Patient EF 40-45%, will repeat his ECHO outpatient after his cardioversion. Patient has a [facility] follow up appointment on 03/10/2022. .   Navigator will sign off at this time.    Earnestine Leys, BSN, Clinical cytogeneticist Only

## 2022-02-23 DIAGNOSIS — I5023 Acute on chronic systolic (congestive) heart failure: Secondary | ICD-10-CM | POA: Diagnosis not present

## 2022-02-23 DIAGNOSIS — I4819 Other persistent atrial fibrillation: Secondary | ICD-10-CM | POA: Diagnosis not present

## 2022-02-23 LAB — CBC
HCT: 52.4 % — ABNORMAL HIGH (ref 39.0–52.0)
Hemoglobin: 18.1 g/dL — ABNORMAL HIGH (ref 13.0–17.0)
MCH: 32.7 pg (ref 26.0–34.0)
MCHC: 34.5 g/dL (ref 30.0–36.0)
MCV: 94.6 fL (ref 80.0–100.0)
Platelets: 271 10*3/uL (ref 150–400)
RBC: 5.54 MIL/uL (ref 4.22–5.81)
RDW: 13.2 % (ref 11.5–15.5)
WBC: 13.7 10*3/uL — ABNORMAL HIGH (ref 4.0–10.5)
nRBC: 0 % (ref 0.0–0.2)

## 2022-02-23 LAB — BASIC METABOLIC PANEL
Anion gap: 10 (ref 5–15)
BUN: 13 mg/dL (ref 8–23)
CO2: 32 mmol/L (ref 22–32)
Calcium: 9.5 mg/dL (ref 8.9–10.3)
Chloride: 96 mmol/L — ABNORMAL LOW (ref 98–111)
Creatinine, Ser: 1.06 mg/dL (ref 0.61–1.24)
GFR, Estimated: >60 mL/min (ref >60)
Glucose, Bld: 95 mg/dL (ref 70–99)
Potassium: 4.2 mmol/L (ref 3.5–5.1)
Sodium: 138 mmol/L (ref 135–145)

## 2022-02-23 LAB — PROTIME-INR
INR: 1.3 — ABNORMAL HIGH (ref 0.8–1.2)
Prothrombin Time: 15.9 seconds — ABNORMAL HIGH (ref 11.4–15.2)

## 2022-02-23 MED ORDER — FUROSEMIDE 10 MG/ML IJ SOLN
80.0000 mg | Freq: Two times a day (BID) | INTRAMUSCULAR | Status: DC
  Administered 2022-02-23 – 2022-02-24 (×3): 80 mg via INTRAVENOUS
  Filled 2022-02-23 (×3): qty 8

## 2022-02-23 MED ORDER — SODIUM CHLORIDE 0.9 % IV SOLN: INTRAVENOUS | Status: DC

## 2022-02-23 MED ORDER — ENSURE MAX PROTEIN PO LIQD
11.0000 [oz_av] | Freq: Every day | ORAL | Status: DC
  Administered 2022-02-24 – 2022-02-25 (×2): 11 [oz_av] via ORAL
  Filled 2022-02-23 (×2): qty 330

## 2022-02-23 NOTE — Progress Notes (Signed)
   02/23/22 1231  What Happened  Was fall witnessed? Yes  Who witnessed fall? Sheilah Mins NT  Patients activity before fall ambulating-unassisted  Point of contact head;buttocks  Was patient injured? Unsure  Provider Notification  Provider Name/Title Dr. Broadus John  Date Provider Notified 02/23/22  Time Provider Notified 1245  Method of Notification Page  Notification Reason Fall  Provider response No new orders (Dr. Broadus John called back; we will just monitor him closely since he is feeling well and not having any new symptoms.)  Date of Provider Response 02/23/22  Time of Provider Response 1338  Follow Up  Family notified No - patient refusal  Progress note created (see row info) Yes  Adult Fall Risk Assessment  Risk Factor Category (scoring not indicated) Fall has occurred during this admission (document High fall risk)  Patient Fall Risk Level High fall risk  Adult Fall Risk Interventions  Required Bundle Interventions *See Row Information* High fall risk - low, moderate, and high requirements implemented  Additional Interventions Use of appropriate toileting equipment (bedpan, BSC, etc.)  Fall intervention(s) refused/Patient educated regarding refusal Supervision while toileting/edge of bed sitting;Bed alarm;Open door if unsupervised  Screening for Fall Injury Risk (To be completed on HIGH fall risk patients) - Assessing Need for Floor Mats  Risk For Fall Injury- Criteria for Floor Mats Previous fall this admission  Will Implement Floor Mats  (not implemented; patient refused and educated.)  Vitals  Temp 97.9 F (36.6 C)  Temp Source Oral  BP 120/87  MAP (mmHg) 99  BP Location Right Arm  BP Method Automatic  Patient Position (if appropriate) Sitting  Pulse Rate 91  Pulse Rate Source Monitor  ECG Heart Rate 94  Cardiac Rhythm Atrial fibrillation  Resp 20  Oxygen Therapy  SpO2 93 %  O2 Device Nasal Cannula  O2 Flow Rate (L/min) 1 L/min  Pain Assessment  Pain Scale 0-10   Pain Score 0  Patients Stated Pain Goal 0  PCA/Epidural/Spinal Assessment  Respiratory Pattern Regular;Unlabored  Neurological  Neuro (WDL) WDL  Level of Consciousness Alert  Glasgow Coma Scale  Eye Opening 4  Best Verbal Response (NON-intubated) 5  Best Motor Response 6  Glasgow Coma Scale Score 15  Musculoskeletal  Musculoskeletal (WDL) WDL

## 2022-02-23 NOTE — Care Management (Signed)
  Transition of Care [facility]) Screening Note   Patient Details  Name: Frank Carpenter Date of Birth: [birth year]   Transition of Care [facility]) CM/SW Contact:    Bethena Roys, RN Phone Number: 02/23/2022, 2:35 PM    Transition of Care Department [facility]) has reviewed the patient and no TOC needs have been identified at this time. PTA patient was from home with spouse. Patient has DME CPAP machine in the home; spouse states he does not use it. Spouse keeps his medications in a pill box. Case Manager discussed home health services with the spouse and she politely declined services. TOC will continue to monitor patient advancement through interdisciplinary progression rounds. If new patient transition needs arise, please place a TOC consult.

## 2022-02-23 NOTE — H&P (View-Only) (Signed)
Rounding Note    Patient Name: Frank Carpenter Date of Encounter: 02/23/2022  [place] Cardiologist: Quay Burow, MD   Subjective   He feels improved but still grossly volume up on exam. He is dropping O2 sat into the low 90s with [place] in place. Questions answered about cardioversion tomorrow.   Inpatient Medications    Scheduled Meds:  apixaban  5 mg Oral BID   docusate sodium  400 mg Oral BID   furosemide  40 mg Intravenous BID   methadone  135 mg Oral Daily   metoprolol tartrate  100 mg Oral BID   mirtazapine  15 mg Oral QHS   sacubitril-valsartan  1 tablet Oral BID   sodium chloride flush  3 mL Intravenous Q12H   Continuous Infusions:  PRN Meds: acetaminophen **OR** acetaminophen, bisacodyl, hydrALAZINE, hydrOXYzine, ondansetron **OR** ondansetron (ZOFRAN) IV, mouth rinse, polyethylene glycol   Vital Signs    Vitals:   02/22/22 2108 02/23/22 0014 02/23/22 0426 02/23/22 0718  BP: (!) 139/95 (!) 132/92 (!) 118/92 (!) 114/98  Pulse:  86 82 87  Resp:   (!) 21 19  Temp: 98.3 F (36.8 C) 98.2 F (36.8 C) 98.1 F (36.7 C) (!) 97.5 F (36.4 C)  TempSrc: Oral Oral Oral Oral  SpO2: 95% 94% 93% 97%  Weight:   103.2 kg   Height:        Intake/Output Summary (Last 24 hours) at 02/23/2022 0747 Last data filed at 02/23/2022 0700 Gross per 24 hour  Intake 960 ml  Output 4250 ml  Net -3290 ml      02/23/2022    4:26 AM 02/22/2022   11:34 AM 02/21/2022    3:01 PM  Last 3 Weights  Weight (lbs) 227 lb 8 oz 228 lb 12.8 oz 228 lb  Weight (kg) 103.193 kg 103.783 kg 103.42 kg      Telemetry    Afib with rates in the 80-90s - Personally Reviewed  ECG    No new tracings - Personally Reviewed  Physical Exam   GEN: No acute distress.   Neck: + JVD Cardiac: irregular rhythm regular rate Respiratory: Clear to auscultation in upper lobes, diminished in bases. GI: Soft, nontender, non-distended  MS: 2-3+ pitting edema B LE; No deformity. Neuro:   Nonfocal  Psych: Normal affect   Labs    High Sensitivity Troponin:   Recent Labs  Lab 02/21/22 1515 02/21/22 1816  TROPONINIHS 5 5     Chemistry Recent Labs  Lab 02/17/22 0918 02/21/22 1515 02/23/22 0411  NA 137 138 138  K 4.1 4.2 4.2  CL 99 102 96*  CO2 22 27 32  GLUCOSE 103* 107* 95  BUN '16 18 13  '$ CREATININE 0.89 0.91 1.06  CALCIUM 9.3 9.0 9.5  PROT  --  6.5  --   ALBUMIN  --  3.9  --   AST  --  25  --   ALT  --  43  --   ALKPHOS  --  86  --   BILITOT  --  1.0  --   GFRNONAA  --  >60 >60  ANIONGAP  --  9 10    Lipids No results for input(s): "CHOL", "TRIG", "HDL", "LABVLDL", "LDLCALC", "CHOLHDL" in the last 168 hours.  Hematology Recent Labs  Lab 02/17/22 0918 02/21/22 1515 02/23/22 0411  WBC 14.3* 12.3* 13.7*  RBC 4.92 4.86 5.54  HGB 16.3 15.7 18.1*  HCT 46.7 46.6 52.4*  MCV 95  95.9 94.6  MCH 33.1* 32.3 32.7  MCHC 34.9 33.7 34.5  RDW 12.4 13.3 13.2  PLT 202 254 271   Thyroid No results for input(s): "TSH", "FREET4" in the last 168 hours.  BNP Recent Labs  Lab 02/21/22 1515  BNP 605.4*    DDimer No results for input(s): "DDIMER" in the last 168 hours.   Radiology    DG Chest Portable 1 View  Result Date: 02/21/2022 CLINICAL DATA:  Shortness of breath. Irregular heart beat. Lower extremity edema. EXAM: PORTABLE CHEST 1 VIEW COMPARISON:  01/14/22 FINDINGS: stable cardiomediastinal contours. No pleural effusion identified. Mild diffuse interstitial edema. Asymmetric perihilar opacity in the mid right lung. Visualized osseous structures are unremarkable. IMPRESSION: 1. Mild interstitial edema. 2. Asymmetric perihilar opacity in the mid right lung which may reflect asymmetric edema versus infection. Electronically Signed   By: Kerby Moors M.D.   On: 02/21/2022 15:39    Cardiac Studies   Echo 01/15/22: 1. Left ventricular ejection fraction, by estimation, is 40 to 45%. The  left ventricle has mildly decreased function. The left ventricle   demonstrates global hypokinesis. The left ventricular internal cavity size  was mildly dilated. Left ventricular  diastolic parameters are indeterminate.   2. Right ventricular systolic function is mildly reduced. The right  ventricular size is mildly enlarged. There is normal pulmonary artery  systolic pressure.   3. Left atrial size was moderately dilated.   4. Right atrial size was moderately dilated.   5. The mitral valve is abnormal. Trivial mitral valve regurgitation. No  evidence of mitral stenosis.   6. The aortic valve is tricuspid. There is moderate calcification of the  aortic valve. There is moderate thickening of the aortic valve. Aortic  valve regurgitation is not visualized. Aortic valve sclerosis is present,  with no evidence of aortic valve  stenosis.   7. The inferior vena cava is normal in size with greater than 50%  respiratory variability, suggesting right atrial pressure of 3 mmHg.   Patient Profile     72 y.o. male 72 y.o. male with a hx of atrial fibrillation, HFrEF, HTN, HLD, thoracic aortic aneurysm, tobacco use, and OSA. Recent admission with new onset Afib and cardiomyopathy, planned for OP cardioversion. He presented to [facility] with worsening CHF and was admitted for diuresis and rate control.    Assessment & Plan    Atrial fibrillation with RVR New diagnosis 12/2021, started on eliquis and metoprolol for rate control, planned for OP DCCV 02/24/22 - telemetry with ventricular rate reasonably controlled on lopressor 100 mg BID - plan for DCCV as scheduled tomorrow - consider holding AM dose of lopressor tomorrow prior to cardioversion   Chronic anticoagulation No signs of bleeding on eliquis Will need 4 weeks of uninterrupted eliquis after DCCV tomorrow   Acute systolic heart failure LVEF 40-45% PRN diuretic did not improve DOE prior to presentation BNP 605, CXF with edema, volume up on exam Diuresing on 40 mg IV lasix with 4.2 L urine output  yesterday, weight is down 1/2 lb but improvement in breathing by patient report - also started on entresto 24-26 mg yesterday - renal function and K stable - he is still volume up on exam and dropping O2 sats into the low 90s on [place] during my exam - will increase lasix to 80 mg IV BID today in preparation for DCCV tomorrow - consolidate lopressor to toprol following DCCV tomorrow depending on heart rates - continue entresto, consider adding SGLT2i and MRA tomorrow  depending on BP   Leukocytosis  WBC 12.3 --> 13.7 He is afebrile without complaints Does not appear infectious     For questions or updates, please contact [place] Please consult [email or web address] for contact info under        Signed, Ledora Bottcher, PA  02/23/2022, 7:47 AM

## 2022-02-23 NOTE — Progress Notes (Signed)
   Heart Failure Stewardship Pharmacist Progress Note   PCP: Marda Stalker, PA-C PCP-Cardiologist: Quay Burow, MD    HPI:  72 yo M with PMH of afib, HFrEF, HTN, HLD, thoracic aortic aneurysm, tobacco use, and OSA.  Admitted from 12/22-12/25 with new onset afib RVR. ECHO at that time showed EF 40-45%, global hypokinesis, RV mildly reduced. Remained in afib at outpatient follow up and was referred for outpatient cardioversion (scheduled for 02/24/22).  Presented to the ED on 1/29 with shortness of breath, weight gain, and LE edema. CXR with mild interstitial edema. Still plan for cardioversion on 2/1.   Current HF Medications: Diuretic: furosemide 40 mg IV BID Beta Blocker: metoprolol tartrate 100 mg BID ACE/ARB/ARNI: Entresto 24/26 mg BID  Prior to admission HF Medications: Diuretic: furosemide 40 mg daily PRN Beta blocker: metoprolol tartrate 100 mg BID  Pertinent Lab Values: Serum creatinine 1.06, BUN 13, Potassium 4.2, Sodium 138, BNP 605.4  Vital Signs: Weight: 227 lbs (admission weight: 228 lbs) Blood pressure: 110-130/90s  Heart rate: 80-90s  I/O: -2.3L yesterday; net -8L  Medication Assistance / Insurance Benefits Check: Does the patient have prescription insurance?  Yes Type of insurance plan: UHC Medicare  Does the patient qualify for medication assistance through manufacturers or grants?   Pending Eligible grants and/or patient assistance programs: pending Medication assistance applications in progress: none  Medication assistance applications approved: none Approved medication assistance renewals will be completed by: pending  Outpatient Pharmacy:  Prior to admission outpatient [facility] Is the patient willing to use [facility] at discharge? Yes Is the patient willing to transition their outpatient pharmacy to utilize a [facility] outpatient pharmacy?   Pending    Assessment: 1. Acute on chronic systolic CHF (LVEF 73-06%), due to presumed  NICM. NYHA class III symptoms. - Continue furosemide 40 mg IV BID. Strict I/Os and daily weights. Keep K>4.  - Continue metoprolol tartrate 100 mg BID - Continue Entresto 24/26 mg BID  - Consider starting MRA and SGLT2i prior to discharge  Plan: 1) Medication changes recommended at this time: - Start Farxiga 10 mg tomorrow if creatinine ok  2) Patient assistance: Delene Loll copay $47 Wilder Glade copay $47 (generic not on insurance formulary) - Jardiance copay $47  3)  Education  - To be completed prior to discharge  Kerby Nora, PharmD, BCPS Heart Failure Cytogeneticist Phone [phone]

## 2022-02-23 NOTE — Progress Notes (Signed)
Initial Nutrition Assessment  DOCUMENTATION CODES:   Not applicable  INTERVENTION:  Provide Ensure Max protein supplement po once daily, each supplement provides 150 kcal and 30g of protein.  Provided nutrition education for heart failure (note to follow).  NUTRITION DIAGNOSIS:   Increased nutrient needs related to catabolic illness (CHF) as evidenced by estimated needs.  GOAL:   Patient will meet greater than or equal to 90% of their needs  MONITOR:   PO intake, Supplement acceptance, Labs, Weight trends, I & O's  REASON FOR ASSESSMENT:   Consult (nutritional goals)    ASSESSMENT:   72 year old male with PMHx of chronic systolic CHF EF 69%, paroxysmal A-fib, tobacco use, OSA, thoracic aortic aneurysm, recent admission for new onset A-fib and CHF admitted with worsening shortness of breath, edema, and A-fib RVR found to have acute systolic CHF.  Plan for DCCV tomorrow.  Met with pt at bedside. He reports his appetite is good and unchanged from baseline. He enjoys the food here and reports he has been eating 90-100% of his meals. At baseline he typically eats 3 meals daily. He enjoys cooking and enjoys learning about nutrition. He and his wife have already started learning about low sodium foods for heart failure. For breakfast he typically has yogurt or bagel with egg or occasionally sausage biscuit. For lunch he has a [place] sandwich. For dinner he has salmon or cod or shrimp dishes with sides including vegetables or he may have a salad with shrimp. He enjoys a variety of fruits and vegetables. Pt reports he stays active and enjoys going to the [facility]. He drinks a Premier Protein shake each morning at home. Patient denies food allergies or intolerances. He denies nausea, emesis, abdominal pain, or difficulty chewing/swallowing. As pt has good appetite and intake, will order Ensure Max Protein once daily as that is similar to Premier Protein shake patient has once daily at home.  Provided education regarding nutrition for heart failure (note to follow).  Pt denies unintentional weight loss. He reports his UBW/dry is 222-223 lbs. He reports gained about 16 lbs from fluid PTA. Current wt is 103.2 kg (227.5 lbs), so likely still falsely elevated from fluid, though pt has been undergoing diuresis.  Medications reviewed and include: Colace, Lasix 80 mg BID, methadone, Remeron 15 mg QHS  Labs reviewed: Chloride 96  UOP: 4250 mL (1.7 mL/kg/hr)  I/O: -8040 mL since admission  NUTRITION - FOCUSED PHYSICAL EXAM:  Flowsheet Row Most Recent Value  Orbital Region No depletion  Upper Arm Region Mild depletion  Thoracic and Lumbar Region No depletion  Buccal Region No depletion  Temple Region No depletion  Clavicle Bone Region No depletion  Clavicle and Acromion Bone Region No depletion  Scapular Bone Region No depletion  Dorsal Hand Mild depletion  Patellar Region No depletion  Anterior Thigh Region No depletion  Posterior Calf Region No depletion  Edema (RD Assessment) Mild  Hair Reviewed  Eyes Reviewed  Mouth Reviewed  Skin Reviewed  Nails Reviewed      Diet Order:   Diet Order             Diet NPO time specified  Diet effective midnight           Diet Heart Room service appropriate? Yes; Fluid consistency: Thin; Fluid restriction: 1500 mL Fluid  Diet effective now                  EDUCATION NEEDS:   Education needs have been  addressed  Skin:  Skin Assessment: Reviewed RN Assessment  Last BM:  02/22/22 - small type 1  Height:   Ht Readings from Last 1 Encounters:  02/22/22 '5\' 10"'$  (1.778 m)   Weight:   Wt Readings from Last 1 Encounters:  02/23/22 103.2 kg   BMI:  Body mass index is 32.64 kg/m.  Estimated Nutritional Needs:   Kcal:  2100-2300  Protein:  105-115 grams  Fluid:  2 L/day or per MD  Loanne Drilling, MS, RD, LDN, [place] Pager number available on Amion

## 2022-02-23 NOTE — Progress Notes (Signed)
Pt refused CPAP

## 2022-02-23 NOTE — Progress Notes (Signed)
Rounding Note    Patient Name: Frank Carpenter Date of Encounter: 02/23/2022  [place] Cardiologist: Quay Burow, MD   Subjective   He feels improved but still grossly volume up on exam. He is dropping O2 sat into the low 90s with [place] in place. Questions answered about cardioversion tomorrow.   Inpatient Medications    Scheduled Meds:  apixaban  5 mg Oral BID   docusate sodium  400 mg Oral BID   furosemide  40 mg Intravenous BID   methadone  135 mg Oral Daily   metoprolol tartrate  100 mg Oral BID   mirtazapine  15 mg Oral QHS   sacubitril-valsartan  1 tablet Oral BID   sodium chloride flush  3 mL Intravenous Q12H   Continuous Infusions:  PRN Meds: acetaminophen **OR** acetaminophen, bisacodyl, hydrALAZINE, hydrOXYzine, ondansetron **OR** ondansetron (ZOFRAN) IV, mouth rinse, polyethylene glycol   Vital Signs    Vitals:   02/22/22 2108 02/23/22 0014 02/23/22 0426 02/23/22 0718  BP: (!) 139/95 (!) 132/92 (!) 118/92 (!) 114/98  Pulse:  86 82 87  Resp:   (!) 21 19  Temp: 98.3 F (36.8 C) 98.2 F (36.8 C) 98.1 F (36.7 C) (!) 97.5 F (36.4 C)  TempSrc: Oral Oral Oral Oral  SpO2: 95% 94% 93% 97%  Weight:   103.2 kg   Height:        Intake/Output Summary (Last 24 hours) at 02/23/2022 0747 Last data filed at 02/23/2022 0700 Gross per 24 hour  Intake 960 ml  Output 4250 ml  Net -3290 ml      02/23/2022    4:26 AM 02/22/2022   11:34 AM 02/21/2022    3:01 PM  Last 3 Weights  Weight (lbs) 227 lb 8 oz 228 lb 12.8 oz 228 lb  Weight (kg) 103.193 kg 103.783 kg 103.42 kg      Telemetry    Afib with rates in the 80-90s - Personally Reviewed  ECG    No new tracings - Personally Reviewed  Physical Exam   GEN: No acute distress.   Neck: + JVD Cardiac: irregular rhythm regular rate Respiratory: Clear to auscultation in upper lobes, diminished in bases. GI: Soft, nontender, non-distended  MS: 2-3+ pitting edema B LE; No deformity. Neuro:   Nonfocal  Psych: Normal affect   Labs    High Sensitivity Troponin:   Recent Labs  Lab 02/21/22 1515 02/21/22 1816  TROPONINIHS 5 5     Chemistry Recent Labs  Lab 02/17/22 0918 02/21/22 1515 02/23/22 0411  NA 137 138 138  K 4.1 4.2 4.2  CL 99 102 96*  CO2 22 27 32  GLUCOSE 103* 107* 95  BUN '16 18 13  '$ CREATININE 0.89 0.91 1.06  CALCIUM 9.3 9.0 9.5  PROT  --  6.5  --   ALBUMIN  --  3.9  --   AST  --  25  --   ALT  --  43  --   ALKPHOS  --  86  --   BILITOT  --  1.0  --   GFRNONAA  --  >60 >60  ANIONGAP  --  9 10    Lipids No results for input(s): "CHOL", "TRIG", "HDL", "LABVLDL", "LDLCALC", "CHOLHDL" in the last 168 hours.  Hematology Recent Labs  Lab 02/17/22 0918 02/21/22 1515 02/23/22 0411  WBC 14.3* 12.3* 13.7*  RBC 4.92 4.86 5.54  HGB 16.3 15.7 18.1*  HCT 46.7 46.6 52.4*  MCV 95  95.9 94.6  MCH 33.1* 32.3 32.7  MCHC 34.9 33.7 34.5  RDW 12.4 13.3 13.2  PLT 202 254 271   Thyroid No results for input(s): "TSH", "FREET4" in the last 168 hours.  BNP Recent Labs  Lab 02/21/22 1515  BNP 605.4*    DDimer No results for input(s): "DDIMER" in the last 168 hours.   Radiology    DG Chest Portable 1 View  Result Date: 02/21/2022 CLINICAL DATA:  Shortness of breath. Irregular heart beat. Lower extremity edema. EXAM: PORTABLE CHEST 1 VIEW COMPARISON:  01/14/22 FINDINGS: stable cardiomediastinal contours. No pleural effusion identified. Mild diffuse interstitial edema. Asymmetric perihilar opacity in the mid right lung. Visualized osseous structures are unremarkable. IMPRESSION: 1. Mild interstitial edema. 2. Asymmetric perihilar opacity in the mid right lung which may reflect asymmetric edema versus infection. Electronically Signed   By: Kerby Moors M.D.   On: 02/21/2022 15:39    Cardiac Studies   Echo 01/15/22: 1. Left ventricular ejection fraction, by estimation, is 40 to 45%. The  left ventricle has mildly decreased function. The left ventricle   demonstrates global hypokinesis. The left ventricular internal cavity size  was mildly dilated. Left ventricular  diastolic parameters are indeterminate.   2. Right ventricular systolic function is mildly reduced. The right  ventricular size is mildly enlarged. There is normal pulmonary artery  systolic pressure.   3. Left atrial size was moderately dilated.   4. Right atrial size was moderately dilated.   5. The mitral valve is abnormal. Trivial mitral valve regurgitation. No  evidence of mitral stenosis.   6. The aortic valve is tricuspid. There is moderate calcification of the  aortic valve. There is moderate thickening of the aortic valve. Aortic  valve regurgitation is not visualized. Aortic valve sclerosis is present,  with no evidence of aortic valve  stenosis.   7. The inferior vena cava is normal in size with greater than 50%  respiratory variability, suggesting right atrial pressure of 3 mmHg.   Patient Profile     72 y.o. male 72 y.o. male with a hx of atrial fibrillation, HFrEF, HTN, HLD, thoracic aortic aneurysm, tobacco use, and OSA. Recent admission with new onset Afib and cardiomyopathy, planned for OP cardioversion. He presented to [facility] with worsening CHF and was admitted for diuresis and rate control.    Assessment & Plan    Atrial fibrillation with RVR New diagnosis 12/2021, started on eliquis and metoprolol for rate control, planned for OP DCCV 02/24/22 - telemetry with ventricular rate reasonably controlled on lopressor 100 mg BID - plan for DCCV as scheduled tomorrow - consider holding AM dose of lopressor tomorrow prior to cardioversion   Chronic anticoagulation No signs of bleeding on eliquis Will need 4 weeks of uninterrupted eliquis after DCCV tomorrow   Acute systolic heart failure LVEF 40-45% PRN diuretic did not improve DOE prior to presentation BNP 605, CXF with edema, volume up on exam Diuresing on 40 mg IV lasix with 4.2 L urine output  yesterday, weight is down 1/2 lb but improvement in breathing by patient report - also started on entresto 24-26 mg yesterday - renal function and K stable - he is still volume up on exam and dropping O2 sats into the low 90s on [place] during my exam - will increase lasix to 80 mg IV BID today in preparation for DCCV tomorrow - consolidate lopressor to toprol following DCCV tomorrow depending on heart rates - continue entresto, consider adding SGLT2i and MRA tomorrow  depending on BP   Leukocytosis  WBC 12.3 --> 13.7 He is afebrile without complaints Does not appear infectious     For questions or updates, please contact [place] Please consult [email or web address] for contact info under        Signed, Ledora Bottcher, PA  02/23/2022, 7:47 AM

## 2022-02-23 NOTE — Progress Notes (Signed)
PROGRESS NOTE    Frank Carpenter  UUV:[id number] DOB: 14-Dec-1950 DOA: 02/21/2022 PCP: Marda Stalker, PA-C  71/M with history of chronic systolic CHF EF 47%, paroxysmal A-fib, tobacco use, OSA, thoracic aortic aneurysm, recent admission for new onset A-fib and CHF, planned outpatient cardioversion presented to the ED with worsening shortness of breath, edema and A-fib RVR, gained 16 pounds in 1 month   Subjective: Feels better overall, breathing is improving  Assessment and Plan:  Acute systolic CHF -Echo 42/59 with EF 40-45%, RV mildly reduced -Likely triggered by A-fib RVR, diuresing well on IV Lasix, 8 L negative -Continue IV Lasix today, started on Entresto yesterday, add SGLT2i tomorrow -BMP in a.m., monitor urine output  A-fib with RVR -Recent diagnosis, changed to Toprol, heart rate suboptimally controlled, plan for DCCV tomorrow, he is compliant with Eliquis -CHA2DS2-VASc score>3  Mild leukocytosis -Likely reactive, he is afebrile and nontoxic, continue to monitor  Chronic pain -Continue methadone per home regimen   HTN -Continue metoprolol   Anxiety -Continue hydroxyzine, mirtazepine   AAA -4.5 cm in 12/2021 -Followed by cardiology -Recommended for semi-annual imaging, due again in 06/2022   OSA -Continue CPAP  DVT prophylaxis: Eliquis Code Status: Full code Family Communication: None present Disposition Plan: Home likely 2 to 3 days  Consultants: Cardiology   Procedures:   Antimicrobials:    Objective: Vitals:   02/23/22 0718 02/23/22 0800 02/23/22 1004 02/23/22 1231  BP: (!) 114/98  131/82 120/87  Pulse: 87  91 91  Resp: 19   20  Temp: (!) 97.5 F (36.4 C)   97.9 F (36.6 C)  TempSrc: Oral   Oral  SpO2: 97% 91% 96% 93%  Weight:      Height:        Intake/Output Summary (Last 24 hours) at 02/23/2022 1410 Last data filed at 02/23/2022 1141 Gross per 24 hour  Intake 1260 ml  Output 5100 ml  Net -3840 ml   Filed Weights   02/21/22  1501 02/22/22 1134 02/23/22 0426  Weight: 103.4 kg 103.8 kg 103.2 kg    Examination:  General exam: Appears calm and comfortable, AAOx3 HEENT: Positive JVD CVS: S1-S2, irregularly irregular rhythm Lungs: Few basilar rales Abdomen: Soft, obese, nontender, bowel sounds present Extremities: 2+ edema Skin: No rashes Psychiatry:  Mood & affect appropriate.     Data Reviewed:   CBC: Recent Labs  Lab 02/17/22 0918 02/21/22 1515 02/23/22 0411  WBC 14.3* 12.3* 13.7*  NEUTROABS  --  9.3*  --   HGB 16.3 15.7 18.1*  HCT 46.7 46.6 52.4*  MCV 95 95.9 94.6  PLT 202 254 563   Basic Metabolic Panel: Recent Labs  Lab 02/17/22 0918 02/21/22 1515 02/23/22 0411  NA 137 138 138  K 4.1 4.2 4.2  CL 99 102 96*  CO2 22 27 32  GLUCOSE 103* 107* 95  BUN '16 18 13  '$ CREATININE 0.89 0.91 1.06  CALCIUM 9.3 9.0 9.5   GFR: Estimated Creatinine Clearance: 75.8 mL/min (by C-G formula based on SCr of 1.06 mg/dL). Liver Function Tests: Recent Labs  Lab 02/21/22 1515  AST 25  ALT 43  ALKPHOS 86  BILITOT 1.0  PROT 6.5  ALBUMIN 3.9   No results for input(s): "LIPASE", "AMYLASE" in the last 168 hours. No results for input(s): "AMMONIA" in the last 168 hours. Coagulation Profile: No results for input(s): "INR", "PROTIME" in the last 168 hours. Cardiac Enzymes: No results for input(s): "CKTOTAL", "CKMB", "CKMBINDEX", "TROPONINI" in the last 168  hours. BNP (last 3 results) No results for input(s): "PROBNP" in the last 8760 hours. HbA1C: No results for input(s): "HGBA1C" in the last 72 hours. CBG: No results for input(s): "GLUCAP" in the last 168 hours. Lipid Profile: No results for input(s): "CHOL", "HDL", "LDLCALC", "TRIG", "CHOLHDL", "LDLDIRECT" in the last 72 hours. Thyroid Function Tests: No results for input(s): "TSH", "T4TOTAL", "FREET4", "T3FREE", "THYROIDAB" in the last 72 hours. Anemia Panel: No results for input(s): "VITAMINB12", "FOLATE", "FERRITIN", "TIBC", "IRON",  "RETICCTPCT" in the last 72 hours. Urine analysis: No results found for: "COLORURINE", "APPEARANCEUR", "LABSPEC", "PHURINE", "GLUCOSEU", "HGBUR", "BILIRUBINUR", "KETONESUR", "PROTEINUR", "UROBILINOGEN", "NITRITE", "LEUKOCYTESUR" Sepsis Labs: '@LABRCNTIP'$ (procalcitonin:4,lacticidven:4)  ) Recent Results (from the past 240 hour(s))  Resp panel by RT-PCR (RSV, Flu A&B, Covid) Anterior Nasal Swab     Status: None   Collection Time: 02/21/22  3:03 PM   Specimen: Anterior Nasal Swab  Result Value Ref Range Status   SARS Coronavirus 2 by RT PCR NEGATIVE NEGATIVE Final    Comment: (NOTE) SARS-CoV-2 target nucleic acids are NOT DETECTED.  The SARS-CoV-2 RNA is generally detectable in upper respiratory specimens during the acute phase of infection. The lowest concentration of SARS-CoV-2 viral copies this assay can detect is 138 copies/mL. A negative result does not preclude SARS-Cov-2 infection and should not be used as the sole basis for treatment or other patient management decisions. A negative result may occur with  improper specimen collection/handling, submission of specimen other than nasopharyngeal swab, presence of viral mutation(s) within the areas targeted by this assay, and inadequate number of viral copies(<138 copies/mL). A negative result must be combined with clinical observations, patient history, and epidemiological information. The expected result is Negative.  Fact Sheet for Patients:  EntrepreneurPulse.com.au  Fact Sheet for Healthcare Providers:  IncredibleEmployment.be  This test is no t yet approved or cleared by the [place] FDA and  has been authorized for detection and/or diagnosis of SARS-CoV-2 by FDA under an Emergency Use Authorization (EUA). This EUA will remain  in effect (meaning this test can be used) for the duration of the COVID-19 declaration under Section 564(b)(1) of the Act, 21 U.S.C.section 360bbb-3(b)(1),  unless the authorization is terminated  or revoked sooner.       Influenza A by PCR NEGATIVE NEGATIVE Final   Influenza B by PCR NEGATIVE NEGATIVE Final    Comment: (NOTE) The Xpert Xpress SARS-CoV-2/FLU/RSV plus assay is intended as an aid in the diagnosis of influenza from Nasopharyngeal swab specimens and should not be used as a sole basis for treatment. Nasal washings and aspirates are unacceptable for Xpert Xpress SARS-CoV-2/FLU/RSV testing.  Fact Sheet for Patients: EntrepreneurPulse.com.au  Fact Sheet for Healthcare Providers: IncredibleEmployment.be  This test is not yet approved or cleared by the [place] FDA and has been authorized for detection and/or diagnosis of SARS-CoV-2 by FDA under an Emergency Use Authorization (EUA). This EUA will remain in effect (meaning this test can be used) for the duration of the COVID-19 declaration under Section 564(b)(1) of the Act, 21 U.S.C. section 360bbb-3(b)(1), unless the authorization is terminated or revoked.     Resp Syncytial Virus by PCR NEGATIVE NEGATIVE Final    Comment: (NOTE) Fact Sheet for Patients: EntrepreneurPulse.com.au  Fact Sheet for Healthcare Providers: IncredibleEmployment.be  This test is not yet approved or cleared by the [place] FDA and has been authorized for detection and/or diagnosis of SARS-CoV-2 by FDA under an Emergency Use Authorization (EUA). This EUA will remain in effect (meaning this test can be used)  for the duration of the COVID-19 declaration under Section 564(b)(1) of the Act, 21 U.S.C. section 360bbb-3(b)(1), unless the authorization is terminated or revoked.  Performed at [facility], [address] [place], [place] [address]      Radiology Studies: DG Chest Portable 1 View  Result Date: 02/21/2022 CLINICAL DATA:  Shortness of breath. Irregular heart beat. Lower extremity  edema. EXAM: PORTABLE CHEST 1 VIEW COMPARISON:  01/14/22 FINDINGS: stable cardiomediastinal contours. No pleural effusion identified. Mild diffuse interstitial edema. Asymmetric perihilar opacity in the mid right lung. Visualized osseous structures are unremarkable. IMPRESSION: 1. Mild interstitial edema. 2. Asymmetric perihilar opacity in the mid right lung which may reflect asymmetric edema versus infection. Electronically Signed   By: Kerby Moors M.D.   On: 02/21/2022 15:39     Scheduled Meds:  apixaban  5 mg Oral BID   docusate sodium  400 mg Oral BID   furosemide  80 mg Intravenous BID   methadone  135 mg Oral Daily   metoprolol tartrate  100 mg Oral BID   mirtazapine  15 mg Oral QHS   sacubitril-valsartan  1 tablet Oral BID   sodium chloride flush  3 mL Intravenous Q12H   Continuous Infusions:   LOS: 1 day    Time spent: 61mn    PDomenic Polite MD [facility]   02/23/2022, 2:10 PM

## 2022-02-23 NOTE — Plan of Care (Signed)
Nutrition Education Note  RD identified need for education regarding CHF  RD provided "Heart Failure Nutrition Therapy" handout from the Academy of Nutrition and Dietetics. Reviewed patient's dietary recall. Provided examples on ways to decrease sodium intake in diet. Discouraged intake of processed foods and use of salt shaker. Encouraged fresh fruits and vegetables as well as whole grain sources of carbohydrates to maximize fiber intake.   RD discussed why it is important for patient to adhere to diet recommendations, and emphasized the role of fluids, foods to avoid, and importance of weighing self daily. Teach back method used.  Expect good compliance.  Current diet order is Heart Healthy, patient is consuming approximately 90-100% of meals at this time.   Loanne Drilling, MS, RD, LDN, CNSC Pager number available on Amion

## 2022-02-23 NOTE — Progress Notes (Signed)
Pt states he does not want to wear CPAP tonighy

## 2022-02-24 ENCOUNTER — Encounter (HOSPITAL_COMMUNITY)
Admission: EM | Disposition: A | Discharge status: Home / Self Care | Payer: Self-pay | Source: Home / Self Care | Attending: Internal Medicine

## 2022-02-24 ENCOUNTER — Ambulatory Visit (HOSPITAL_COMMUNITY): Admission: RE | Admit: 2022-02-24 | Payer: Medicare Other | Source: Home / Self Care | Admitting: Cardiovascular Disease

## 2022-02-24 ENCOUNTER — Encounter (HOSPITAL_COMMUNITY): Payer: Self-pay | Admitting: Internal Medicine

## 2022-02-24 ENCOUNTER — Telehealth: Payer: Self-pay | Admitting: Internal Medicine

## 2022-02-24 ENCOUNTER — Inpatient Hospital Stay (HOSPITAL_COMMUNITY): Payer: Medicare Other | Admitting: Certified Registered"

## 2022-02-24 DIAGNOSIS — Z87891 Personal history of nicotine dependence: Secondary | ICD-10-CM

## 2022-02-24 DIAGNOSIS — I509 Heart failure, unspecified: Secondary | ICD-10-CM

## 2022-02-24 DIAGNOSIS — I4819 Other persistent atrial fibrillation: Secondary | ICD-10-CM | POA: Diagnosis not present

## 2022-02-24 DIAGNOSIS — I110 Hypertensive heart disease with heart failure: Secondary | ICD-10-CM

## 2022-02-24 DIAGNOSIS — I4891 Unspecified atrial fibrillation: Secondary | ICD-10-CM

## 2022-02-24 DIAGNOSIS — I5023 Acute on chronic systolic (congestive) heart failure: Secondary | ICD-10-CM | POA: Diagnosis not present

## 2022-02-24 HISTORY — PX: CARDIOVERSION: SHX1299

## 2022-02-24 LAB — BASIC METABOLIC PANEL
Anion gap: 13 (ref 5–15)
BUN: 18 mg/dL (ref 8–23)
CO2: 28 mmol/L (ref 22–32)
Calcium: 9.3 mg/dL (ref 8.9–10.3)
Chloride: 95 mmol/L — ABNORMAL LOW (ref 98–111)
Creatinine, Ser: 1.14 mg/dL (ref 0.61–1.24)
GFR, Estimated: >60 mL/min (ref >60)
Glucose, Bld: 83 mg/dL (ref 70–99)
Potassium: 3.7 mmol/L (ref 3.5–5.1)
Sodium: 136 mmol/L (ref 135–145)

## 2022-02-24 SURGERY — CARDIOVERSION
Anesthesia: General

## 2022-02-24 MED ORDER — FUROSEMIDE 40 MG PO TABS
40.0000 mg | ORAL_TABLET | Freq: Every day | ORAL | Status: DC
  Administered 2022-02-25: 40 mg via ORAL
  Filled 2022-02-24: qty 1

## 2022-02-24 MED ORDER — SODIUM CHLORIDE 0.9 % IV SOLN: INTRAVENOUS | Status: DC | PRN

## 2022-02-24 MED ORDER — SPIRONOLACTONE 12.5 MG HALF TABLET
12.5000 mg | ORAL_TABLET | Freq: Every day | ORAL | Status: DC
  Administered 2022-02-24 – 2022-02-25 (×2): 12.5 mg via ORAL
  Filled 2022-02-24 (×2): qty 1

## 2022-02-24 MED ORDER — DAPAGLIFLOZIN PROPANEDIOL 10 MG PO TABS
10.0000 mg | ORAL_TABLET | Freq: Every day | ORAL | Status: DC
  Administered 2022-02-24 – 2022-02-25 (×2): 10 mg via ORAL
  Filled 2022-02-24 (×2): qty 1

## 2022-02-24 NOTE — Anesthesia Postprocedure Evaluation (Signed)
Anesthesia Post Note  Patient: [name]  Procedure(s) Performed: CARDIOVERSION     Patient location during evaluation: PACU Anesthesia Type: General Level of consciousness: awake and alert Pain management: pain level controlled Vital Signs Assessment: post-procedure vital signs reviewed and stable Respiratory status: spontaneous breathing, nonlabored ventilation and respiratory function stable Cardiovascular status: blood pressure returned to baseline and stable Postop Assessment: no apparent nausea or vomiting Anesthetic complications: no   No notable events documented.  Last Vitals:  Vitals:   02/24/22 1013 02/24/22 1200  BP: (!) 126/93 112/82  Pulse: 89 92  Resp: 16 16  Temp: 36.8 C 36.4 C  SpO2: 93% 95%    Last Pain:  Vitals:   02/24/22 1200  TempSrc: Oral  PainSc: 0-No pain                 Lynda Rainwater

## 2022-02-24 NOTE — Telephone Encounter (Signed)
Patient stated he is in hospital and saw PA Sarajane Jews and is calling to follow-up on when he will be discharged.

## 2022-02-24 NOTE — Progress Notes (Deleted)
Cardiology Office Note:    Date:  02/24/2022   ID:  Frank Carpenter, DOB December 09, 1950, MRN [id number]  PCP:  Marda Stalker, PA-C  Cardiologist:  Quay Burow, MD  Electrophysiologist:  None   Referring MD: Marda Stalker, PA-C   Chief Complaint: hospital follow-up of CHF and atrial fibrillation  History of Present Illness:    Frank Carpenter is a 72 y.o. male with a history of chronic HFrEF with EF of 40-45% on Echo in 12/2021, persistent atrial fibrillation s/p recent DCCV on 02/24/2022 on Eliquis, thoracic aortic aneurysm, hypertension, hyperlipidemia, obstructive sleep apnea, and tobacco abuse who is followed by Dr. Gwenlyn Found and presents today for hospital follow-up of acute CHF and atrial fibrillation.   Patient was referred to Dr. Gwenlyn Found in 05/2019 for further evaluation of progressive dyspnea on exertion over the last 5-6 months. He denied any chest pain at that time. Echo and coronary calcium score were ordered for further evaluation. Echo showed LVEF of 60-65% with normal wall motion, mild concentric LVH, and grade 1 diastolic dysfunction as well as mild dilatation of the aortic root and ascending aorta measuring 35m and 449mrespectively. CT cardiac scoring showed a coronary calcium score of 0 and mild aortic root dilation measuring 4.1cm. He was admitted in 12/2021 for new onset atrial fibrillation with RVR after presenting with chest pain and shortness of breath. Echo showed LVEF of 40-45% with global hypokinesis, mildly reduced RV function, and  biatrial enlargement. Cardiomyopathy was felt to be tachymediated in nature. He was given one dose of IV Lasix and started on IV Diltiziam, Lopressor, and Eliquis. IV Diltiazem was weaned off and he remained rate controlled so plan was for outpatient DCCV after 3 weeks of uninterrupted anticoagulation. He was discharged on Lopressor, Eliquis, and PRN Lasix and home Olmesartan-Amlodipine-HCTZ was stopped. Patient was seen by CaLaurann MontanaNP,  for follow-up on 01/19/2022 at which time he was still in rate controlled atrial fibrillation and  reported some trouble catching his breath in the morning but otherwise denied any shortness of breath. Outpatient DCCV  was arranged and scheduled for 02/24/2022.   However, he then presented to the ED on 02/21/2022 for further evaluation of shortness of breath and lower extremity edema. He was admitted for acute on chronic CHF and atrial fibrillation. He was diuresed with IV Lasix and started on GDMT including Entresto, Spironolactone, and FaIranHe the underwent successful cardioversion on 02/24/2022 with restoration of sinus rhythm. He was ultimately discharged on 02/25/2022. He was net negative 11.4 L during admission and discharge weight was 224 lbs.  Patient presents today for follow-up.  Acute on Chronic HFrEF Patient was recently admitted for acute on chronic CHF likely secondary to atrial fibrillation. Echo in 12/2021 showed LVEF of 40-45% with global hypokinesis, mildly reduced RV function, biatrial enlargement, and no significant valvular disease. EF down from 60-65% in 06/2021. - *** - Continue Lasix '40mg'$  daily. - Continue Entresto 24-'26mg'$  twice daily  - Currently on Lopressor '100mg'$  twice daily. Will transition to Toprol-XL '200mg'$  once daily given reduced EF. *** - Continue Spironolactone 12.'5mg'$  daily. - Continue Fargixa '10mg'$  daily. - Discussed importance of daily weights and sodium/ fluid restrictions. - Cardiomyopathy likely tachy-mediated in nature given atrial fibrillation with RVR. He is now back in sinus rhythm. Will repeat Echo in about 2 months. If EF still down at that time after restoration of sinus rhythm and optimization of GDMT, would recommend ischemic evaluation. *** - Will check BMET today. ***  Persistent Atrial Fibrillation Initially diagnosed in 12/2021. S/p successful DCCV on 03/07/2022. - Maintaining sinus rhythm. - Currently on Lopressor '100mg'$  twice daily. Will transition  to Toprol-XL '200mg'$  once daily given reduced EF. - Continue chronic anticoagulation with Eliquis '5mg'$  twice daily.   Ascending Thoracic Aortic Aneurysm CTA in 12/2021 showed 4.5 cm aneurysm of ascending aorta.  - Plan is for semi-annual imaging as an outpatient.    Hypertension Diastolic BP mildly elevated at times but BP mostly well controlled.  - Continue medications for CHF as above.   Hyperlipidemia LDL 91 in 12/2021. - Intolerant to statins and Zetia. ***   Obstructive Sleep Apnea - Continue CPAP.   Past Medical History:  Diagnosis Date   Atrial fibrillation, chronic (HCC)    Chronic systolic (congestive) heart failure (HCC)    High cholesterol    OSA (obstructive sleep apnea)    Testosterone deficiency     Past Surgical History:  Procedure Laterality Date   CARDIOVERSION N/A 02/24/2022   Procedure: CARDIOVERSION;  Surgeon: Josue Hector, MD;  Location: [facility] ENDOSCOPY;  Service: Cardiovascular;  Laterality: N/A;   NASAL SEPTUM SURGERY  04/2021    Current Medications: No outpatient medications have been marked as taking for the 03/02/22 encounter (Appointment) with [name], PA-C.     Allergies:   Ezetimibe and Statins   Social History   Socioeconomic History   Marital status: Married    Spouse name: Not on file   Number of children: Not on file   Years of education: Not on file   Highest education level: Not on file  Occupational History   Occupation: retired  Tobacco Use   Smoking status: Former    Packs/day: 1.00    Years: 15.00    Total pack years: 15.00    Types: Cigarettes    Quit date: 2004    Years since quitting: 20.0   Smokeless tobacco: Never  Substance and Sexual Activity   Alcohol use: Yes    Alcohol/week: 2.0 standard drinks of alcohol    Types: 2 Cans of beer per week    Comment: rare   Drug use: Not Currently    Types: Marijuana    Comment: hydrocodone prescribed for pain   Sexual activity: Not on file  Other Topics Concern    Not on file  Social History Narrative   Not on file   Social Determinants of Health   Financial Resource Strain: Not on file  Food Insecurity: No Food Insecurity (02/22/2022)   Hunger Vital Sign    Worried About Running Out of Food in the Last Year: Never true    Ran Out of Food in the Last Year: Never true  Transportation Needs: No Transportation Needs (02/22/2022)   PRAPARE - Hydrologist (Medical): No    Lack of Transportation (Non-Medical): No  Physical Activity: Not on file  Stress: Not on file  Social Connections: Not on file     Family History: The patient's family history includes Bladder Cancer in his mother; Hypertension in his father.  ROS:   Please see the history of present illness.     EKGs/Labs/Other Studies Reviewed:    The following studies were reviewed:  CT Cardiac Scoring 06/26/2019: Impression: - Coronary calcium score of 0.  - Suggest f/u CTA of aorta in 6 months. _______________  Chest CTA 01/14/2022: Impressions: 1. No evidence of pulmonary embolism or other acute process. 2. Cardiomegaly with coronary artery calcifications. 3. Aneurysmal  dilatation of the ascending aorta measuring 4.5 cm. Ascending thoracic aortic aneurysm. Recommend semi-annual imaging followup by CTA or MRA and referral to cardiothoracic surgery if not already obtained. This recommendation follows 2010 ACCF/AHA/AATS/ACR/ASA/SCA/SCAI/SIR/STS/SVM Guidelines for the Diagnosis and Management of Patients With Thoracic Aortic Disease. Circulation. 2010; 121: C623-J628. Aortic aneurysm NOS (ICD10-I71.9) 4. Distended pulmonary trunk suggesting underlying pulmonary artery hypertension. _______________  Echocardiogram 01/15/2022: Impressions:  1. Left ventricular ejection fraction, by estimation, is 40 to 45%. The  left ventricle has mildly decreased function. The left ventricle  demonstrates global hypokinesis. The left ventricular internal cavity  size  was mildly dilated. Left ventricular  diastolic parameters are indeterminate.   2. Right ventricular systolic function is mildly reduced. The right  ventricular size is mildly enlarged. There is normal pulmonary artery  systolic pressure.   3. Left atrial size was moderately dilated.   4. Right atrial size was moderately dilated.   5. The mitral valve is abnormal. Trivial mitral valve regurgitation. No  evidence of mitral stenosis.   6. The aortic valve is tricuspid. There is moderate calcification of the  aortic valve. There is moderate thickening of the aortic valve. Aortic  valve regurgitation is not visualized. Aortic valve sclerosis is present,  with no evidence of aortic valve  stenosis.   7. The inferior vena cava is normal in size with greater than 50%  respiratory variability, suggesting right atrial pressure of 3 mmHg.     EKG:  EKG ordered today. EKG personally reviewed and demonstrates ***.  Recent Labs: 01/15/2022: TSH 1.219 01/17/2022: Magnesium 1.9 02/21/2022: ALT 43; B Natriuretic Peptide 605.4 02/23/2022: Hemoglobin 18.1; Platelets 271 02/24/2022: BUN 18; Creatinine, Ser 1.14; Potassium 3.7; Sodium 136  Recent Lipid Panel    Component Value Date/Time   CHOL 151 01/15/2022 1847   TRIG 81 01/15/2022 1847   HDL 44 01/15/2022 1847   CHOLHDL 3.4 01/15/2022 1847   VLDL 16 01/15/2022 1847   LDLCALC 91 01/15/2022 1847    Physical Exam:    Vital Signs: There were no vitals taken for this visit.    Wt Readings from Last 3 Encounters:  02/24/22 224 lb 13.9 oz (102 kg)  01/19/22 223 lb 3.2 oz (101.2 kg)  01/17/22 226 lb 6.4 oz (102.7 kg)     General: 72 y.o. male in no acute distress. HEENT: Normocephalic and atraumatic. Sclera clear. EOMs intact. Neck: Supple. No carotid bruits. No JVD. Heart: *** RRR. Distinct S1 and S2. No murmurs, gallops, or rubs. Radial and distal pedal pulses 2+ and equal bilaterally. Lungs: No increased work of breathing. Clear to  ausculation bilaterally. No wheezes, rhonchi, or rales.  Abdomen: Soft, non-distended, and non-tender to palpation. Bowel sounds present in all 4 quadrants.  MSK: Normal strength and tone for age. *** Extremities: No lower extremity edema.    Skin: Warm and dry. Neuro: Alert and oriented x3. No focal deficits. Psych: Normal affect. Responds appropriately.   Assessment:    No diagnosis found.  Plan:     Disposition: Follow up in ***   Medication Adjustments/Labs and Tests Ordered: Current medicines are reviewed at length with the patient today.  Concerns regarding medicines are outlined above.  No orders of the defined types were placed in this encounter.  No orders of the defined types were placed in this encounter.   There are no Patient Instructions on file for this visit.   Signed, [name], PA-C  02/24/2022 5:31 PM    [facility]

## 2022-02-24 NOTE — Progress Notes (Signed)
Call received from Endo, requesting for patient to receive Eliquis and that they will pick patient up afterwards. Educated patient and updated patient on plan of care.

## 2022-02-24 NOTE — Progress Notes (Signed)
Pt refused CPAP

## 2022-02-24 NOTE — CV Procedure (Signed)
[place]: On Rx DOAC no missed doses Anesthesia: Propofol  DCC x 2 150 J then 200 J with manual compression   Converted from afib with PVCls rate 111 to NSR rate 88 bpm  No immediate neurologic sequelae  [name] MD [facility]

## 2022-02-24 NOTE — Progress Notes (Signed)
Rounding Note    Patient Name: Frank Carpenter Date of Encounter: 02/24/2022  [place] Cardiologist: Quay Burow, MD   Subjective   No acute overnight events. Feeling much better. Still having some shortness breath and orthopnea. Lower extremity edema improving. No chest pain or palpitations.  Inpatient Medications    Scheduled Meds:  apixaban  5 mg Oral BID   docusate sodium  400 mg Oral BID   furosemide  80 mg Intravenous BID   methadone  135 mg Oral Daily   metoprolol tartrate  100 mg Oral BID   mirtazapine  15 mg Oral QHS   Ensure Max Protein  11 oz Oral Daily   sacubitril-valsartan  1 tablet Oral BID   sodium chloride flush  3 mL Intravenous Q12H   Continuous Infusions:  sodium chloride     PRN Meds: acetaminophen **OR** acetaminophen, bisacodyl, hydrALAZINE, hydrOXYzine, ondansetron **OR** ondansetron (ZOFRAN) IV, mouth rinse, polyethylene glycol   Vital Signs    Vitals:   02/23/22 1231 02/23/22 1400 02/23/22 2034 02/24/22 0416  BP: 120/87  112/84 (!) 124/90  Pulse: 91  100 95  Resp: '20  18 18  '$ Temp: 97.9 F (36.6 C)  97.9 F (36.6 C) (!) 97.5 F (36.4 C)  TempSrc: Oral  Oral Oral  SpO2: 93% 92% 94% 94%  Weight:    102 kg  Height:        Intake/Output Summary (Last 24 hours) at 02/24/2022 0640 Last data filed at 02/24/2022 0415 Gross per 24 hour  Intake 780 ml  Output 3700 ml  Net -2920 ml      02/24/2022    4:16 AM 02/23/2022    4:26 AM 02/22/2022   11:34 AM  Last 3 Weights  Weight (lbs) 224 lb 13.9 oz 227 lb 8 oz 228 lb 12.8 oz  Weight (kg) 102 kg 103.193 kg 103.783 kg      Telemetry    Atrial fibrillation with frequent PVCs (sometimes in a bigeminy). Some ventricular couplets and triplets noted. Rates ranging from 90s to 120s. Currently in the 100s to 110s.  - Personally Reviewed  ECG    No new ECG tracing today. - Personally Reviewed  Physical Exam   GEN: No acute distress.   Neck: Distended neck veins.  Cardiac: Mildly  tachycardic with irregularly irregular rhythm. No murmurs, rubs, or gallops. Radial and distal pedal pulses 2+ and equal bilaterally. Respiratory: No increased work of breathing. Decreased breath sounds in bilateral bases (right > left). No wheezes, rhonchi, or rales. GI: Soft, non-distended, and non-tender. MS: 1+ pitting edema of bilateral lower extremity (mostly pedal and ankle edema). Skin: Warm and dry. Neuro:  No focal deficits. Psych: Normal affect. Responds appropriately.   Labs    High Sensitivity Troponin:   Recent Labs  Lab 02/21/22 1515 02/21/22 1816  TROPONINIHS 5 5     Chemistry Recent Labs  Lab 02/21/22 1515 02/23/22 0411 02/24/22 0153  NA 138 138 136  K 4.2 4.2 3.7  CL 102 96* 95*  CO2 27 32 28  GLUCOSE 107* 95 83  BUN '18 13 18  '$ CREATININE 0.91 1.06 1.14  CALCIUM 9.0 9.5 9.3  PROT 6.5  --   --   ALBUMIN 3.9  --   --   AST 25  --   --   ALT 43  --   --   ALKPHOS 86  --   --   BILITOT 1.0  --   --  GFRNONAA >60 >60 >60  ANIONGAP '9 10 13    '$ Lipids No results for input(s): "CHOL", "TRIG", "HDL", "LABVLDL", "LDLCALC", "CHOLHDL" in the last 168 hours.  Hematology Recent Labs  Lab 02/17/22 0918 02/21/22 1515 02/23/22 0411  WBC 14.3* 12.3* 13.7*  RBC 4.92 4.86 5.54  HGB 16.3 15.7 18.1*  HCT 46.7 46.6 52.4*  MCV 95 95.9 94.6  MCH 33.1* 32.3 32.7  MCHC 34.9 33.7 34.5  RDW 12.4 13.3 13.2  PLT 202 254 271   Thyroid No results for input(s): "TSH", "FREET4" in the last 168 hours.  BNP Recent Labs  Lab 02/21/22 1515  BNP 605.4*    DDimer No results for input(s): "DDIMER" in the last 168 hours.   Radiology    No results found.  Cardiac Studies   Echocardiogram 01/15/2022: Impressions: 1. Left ventricular ejection fraction, by estimation, is 40 to 45%. The  left ventricle has mildly decreased function. The left ventricle  demonstrates global hypokinesis. The left ventricular internal cavity size  was mildly dilated. Left ventricular   diastolic parameters are indeterminate.   2. Right ventricular systolic function is mildly reduced. The right  ventricular size is mildly enlarged. There is normal pulmonary artery  systolic pressure.   3. Left atrial size was moderately dilated.   4. Right atrial size was moderately dilated.   5. The mitral valve is abnormal. Trivial mitral valve regurgitation. No  evidence of mitral stenosis.   6. The aortic valve is tricuspid. There is moderate calcification of the  aortic valve. There is moderate thickening of the aortic valve. Aortic  valve regurgitation is not visualized. Aortic valve sclerosis is present,  with no evidence of aortic valve  stenosis.   7. The inferior vena cava is normal in size with greater than 50%  respiratory variability, suggesting right atrial pressure of 3 mmHg.    Patient Profile     72 y.o. male with a history of chronic HFrEF with EF of 40-45%, persistent atrial fibrillation on Eliquis, thoracic aortic aneurysm, hypertension, hyperlipidemia, obstructive sleep apnea, and tobacco abuse. Patient was recently admitted last month for new onset atrial fibrillation and was started on beta-blocker and anticoagulation. He was seen in the office for follow-up and was still in atrial fibrillation so outpatient cardioversion was arranged for 02/24/2022. However, he presented to the ED on 02/21/2022 for further evaluation of shortness of breath and lower extremity edema and was noted to be volume overloaded. He was admitted for acute CHF and Cardiology consulted for assistance.  Assessment & Plan    Acute on Chronic HFrEF Presented with shortness of breath and lower extremity edema. BNP elevated at 605. Chest x-ray showed mild interstitial edema and asymmetric perihilar opacity in the mid right lung which may reflect asymmetric edema vs infection. Recent Echo in 12/2021 showed LVEF of 40-45% with global hypokinesis, mildly reduced RV function, biatrial enlargement, and no  significant valvular disease. He is currently being diuresed with IV Lasix. Documented urinary output of 3.350 L yesterday and net negative 10.85 L this admission. Weight down 4 lbs since admission - 224 lbs today. Renal function stable. - Still volume overloaded but  improving. - Continue IV Lasix '80mg'$  twice daily. - Continue Entresto 24-'26mg'$  twice daily (new this admission).  - Continue Lopressor '100mg'$  twice daily. Can transition to Toprol-XL prior to discharge given reduced EF. - Will start Spironolactone 12.'5mg'$  daily and Fargixa '10mg'$  daily today. - Continue to monitor daily weights, strict I/Os, and renal function. - Suspect  cardiomyopathy may be tachy-mediated in nature given atrial fibrillation with RVR. Recommended repeating Echo in 2-3 months after restoration of sinus rhythm and optimization of GDMT. If EF still down at that time, would recommend ischemic evaluation.  Persistent Atrial Fibrillation Initially diagnosed in 12/2021. He was started on beta-blocker and anticoagulation with plans for outpatient DCCV on 02/24/2022 but then presented with CHF. - Still in atrial fibrillation with rates in the 100s to 110s this morning. - Continue Lopressor '100mg'$  twice daily. - Continue chronic anticoagulation with Eliquis '5mg'$  twice daily. - Scheduled for DCCV today. He has not missed any doses of Eliquis within the last month.   Ascending Thoracic Aortic Aneurysm CTA in 12/2021 showed 4.5 cm aneurysm of ascending aorta.  - Plan is for semi-annual imaging as an outpatient.   Hypertension Diastolic BP mildly elevated at times but BP mostly well controlled.  - Continue medications for CHF as above.  Hyperlipidemia LDL 91 in 12/2021. - Intolerant to statins and Zetia.  Obstructive Sleep Apnea - Continue CPAP.  Otherwise, per primary team: - Chronic pain: on Methadone - Anxiety - Mild leukocytosis    For questions or updates, please contact [place] Please consult  [email or web address] for contact info under        Signed, [name], PA-C  02/24/2022, 6:40 AM

## 2022-02-24 NOTE — Anesthesia Preprocedure Evaluation (Addendum)
Anesthesia Evaluation  Patient identified by MRN, date of birth, ID band Patient awake    Reviewed: Allergy & Precautions, H&P , NPO status , Patient's Chart, lab work & pertinent test results  Airway Mallampati: II  TM Distance: >3 FB Neck ROM: Full    Dental no notable dental hx.    Pulmonary sleep apnea , former smoker   Pulmonary exam normal breath sounds clear to auscultation       Cardiovascular hypertension, Pt. on medications +CHF  + dysrhythmias Atrial Fibrillation  Rhythm:Regular Rate:Normal     Neuro/Psych   Anxiety     negative neurological ROS  negative psych ROS   GI/Hepatic negative GI ROS, Neg liver ROS,,,  Endo/Other  negative endocrine ROS    Renal/GU negative Renal ROS  negative genitourinary   Musculoskeletal negative musculoskeletal ROS (+)    Abdominal  (+) + obese  Peds negative pediatric ROS (+)  Hematology negative hematology ROS (+)   Anesthesia Other Findings   Reproductive/Obstetrics negative OB ROS                             Anesthesia Physical Anesthesia Plan  ASA: 3  Anesthesia Plan: General   Post-op Pain Management: Minimal or no pain anticipated   Induction: Intravenous  PONV Risk Score and Plan: 2  Airway Management Planned: Mask  Additional Equipment:   Intra-op Plan:   Post-operative Plan:   Informed Consent: I have reviewed the patients History and Physical, chart, labs and discussed the procedure including the risks, benefits and alternatives for the proposed anesthesia with the patient or authorized representative who has indicated his/her understanding and acceptance.     Dental advisory given  Plan Discussed with: CRNA  Anesthesia Plan Comments:        Anesthesia Quick Evaluation

## 2022-02-24 NOTE — Progress Notes (Signed)
PROGRESS NOTE    CANDELARIO STEPPE III  IFO:[id number] DOB: 1950-11-02 DOA: 02/21/2022 PCP: Marda Stalker, PA-C  71/M with history of chronic systolic CHF EF 67%, paroxysmal A-fib, tobacco use, OSA, thoracic aortic aneurysm, recent admission for new onset A-fib and CHF, planned outpatient cardioversion presented to the ED with worsening shortness of breath, edema and A-fib RVR, gained 16 pounds in 1 month   Subjective: -Sitting up in the chair, feels much better overall, awaiting cardioversion  Assessment and Plan:  Acute systolic CHF -Echo 67/20 with EF 40-45%, RV mildly reduced -Likely triggered by A-fib RVR, diuresing well on IV Lasix, 10.8 L negative, creatinine stable at 1.1 -Can transition to oral diuretics, continue Entresto, add SGLT2 -BMP in a.m  A-fib with RVR -Recent diagnosis, changed to Toprol, heart rate suboptimally controlled, plan for DCCV today, he is compliant with Eliquis -CHA2DS2-VASc score>3  Mild leukocytosis -Likely reactive, he is afebrile and nontoxic  Chronic pain -Continue methadone per home regimen   HTN -Continue metoprolol   Anxiety -Continue hydroxyzine, mirtazepine   AAA -4.5 cm in 12/2021 -Followed by cardiology -Recommended for semi-annual imaging, due again in 06/2022   OSA -Continue CPAP  DVT prophylaxis: Eliquis Code Status: Full code Family Communication: None present Disposition Plan: Home likely tomorrow  Consultants: Cardiology   Procedures:   Antimicrobials:    Objective: Vitals:   02/24/22 0938 02/24/22 0945 02/24/22 0950 02/24/22 1013  BP:  113/79 131/87 (!) 126/93  Pulse: 94 89 90 89  Resp: '18 14 17 16  '$ Temp:    98.2 F (36.8 C)  TempSrc:    Oral  SpO2: 97% 96% 94% 93%  Weight:      Height:        Intake/Output Summary (Last 24 hours) at 02/24/2022 1156 Last data filed at 02/24/2022 9470 Gross per 24 hour  Intake 50 ml  Output 2000 ml  Net -1950 ml   Filed Weights   02/23/22 0426 02/24/22 0416  02/24/22 0904  Weight: 103.2 kg 102 kg 102 kg    Examination:  General exam: Appears calm and comfortable, AAOx3 HEENT: Positive JVD CVS: S1-S2, irregularly irregular rhythm Lungs: Few basilar rales Abdomen: Soft, obese, nontender, bowel sounds present Extremities: Trace edema Skin: No rashes Psychiatry:  Mood & affect appropriate.     Data Reviewed:   CBC: Recent Labs  Lab 02/21/22 1515 02/23/22 0411  WBC 12.3* 13.7*  NEUTROABS 9.3*  --   HGB 15.7 18.1*  HCT 46.6 52.4*  MCV 95.9 94.6  PLT 254 962   Basic Metabolic Panel: Recent Labs  Lab 02/21/22 1515 02/23/22 0411 02/24/22 0153  NA 138 138 136  K 4.2 4.2 3.7  CL 102 96* 95*  CO2 27 32 28  GLUCOSE 107* 95 83  BUN '18 13 18  '$ CREATININE 0.91 1.06 1.14  CALCIUM 9.0 9.5 9.3   GFR: Estimated Creatinine Clearance: 70.1 mL/min (by C-G formula based on SCr of 1.14 mg/dL). Liver Function Tests: Recent Labs  Lab 02/21/22 1515  AST 25  ALT 43  ALKPHOS 86  BILITOT 1.0  PROT 6.5  ALBUMIN 3.9   No results for input(s): "LIPASE", "AMYLASE" in the last 168 hours. No results for input(s): "AMMONIA" in the last 168 hours. Coagulation Profile: Recent Labs  Lab 02/23/22 1824  INR 1.3*   Cardiac Enzymes: No results for input(s): "CKTOTAL", "CKMB", "CKMBINDEX", "TROPONINI" in the last 168 hours. BNP (last 3 results) No results for input(s): "PROBNP" in the last 8760 hours. HbA1C:  No results for input(s): "HGBA1C" in the last 72 hours. CBG: No results for input(s): "GLUCAP" in the last 168 hours. Lipid Profile: No results for input(s): "CHOL", "HDL", "LDLCALC", "TRIG", "CHOLHDL", "LDLDIRECT" in the last 72 hours. Thyroid Function Tests: No results for input(s): "TSH", "T4TOTAL", "FREET4", "T3FREE", "THYROIDAB" in the last 72 hours. Anemia Panel: No results for input(s): "VITAMINB12", "FOLATE", "FERRITIN", "TIBC", "IRON", "RETICCTPCT" in the last 72 hours. Urine analysis: No results found for: "COLORURINE",  "APPEARANCEUR", "LABSPEC", "PHURINE", "GLUCOSEU", "HGBUR", "BILIRUBINUR", "KETONESUR", "PROTEINUR", "UROBILINOGEN", "NITRITE", "LEUKOCYTESUR" Sepsis Labs: '@LABRCNTIP'$ (procalcitonin:4,lacticidven:4)  ) Recent Results (from the past 240 hour(s))  Resp panel by RT-PCR (RSV, Flu A&B, Covid) Anterior Nasal Swab     Status: None   Collection Time: 02/21/22  3:03 PM   Specimen: Anterior Nasal Swab  Result Value Ref Range Status   SARS Coronavirus 2 by RT PCR NEGATIVE NEGATIVE Final    Comment: (NOTE) SARS-CoV-2 target nucleic acids are NOT DETECTED.  The SARS-CoV-2 RNA is generally detectable in upper respiratory specimens during the acute phase of infection. The lowest concentration of SARS-CoV-2 viral copies this assay can detect is 138 copies/mL. A negative result does not preclude SARS-Cov-2 infection and should not be used as the sole basis for treatment or other patient management decisions. A negative result may occur with  improper specimen collection/handling, submission of specimen other than nasopharyngeal swab, presence of viral mutation(s) within the areas targeted by this assay, and inadequate number of viral copies(<138 copies/mL). A negative result must be combined with clinical observations, patient history, and epidemiological information. The expected result is Negative.  Fact Sheet for Patients:  EntrepreneurPulse.com.au  Fact Sheet for Healthcare Providers:  IncredibleEmployment.be  This test is no t yet approved or cleared by the [place] FDA and  has been authorized for detection and/or diagnosis of SARS-CoV-2 by FDA under an Emergency Use Authorization (EUA). This EUA will remain  in effect (meaning this test can be used) for the duration of the COVID-19 declaration under Section 564(b)(1) of the Act, 21 U.S.C.section 360bbb-3(b)(1), unless the authorization is terminated  or revoked sooner.       Influenza A by PCR  NEGATIVE NEGATIVE Final   Influenza B by PCR NEGATIVE NEGATIVE Final    Comment: (NOTE) The Xpert Xpress SARS-CoV-2/FLU/RSV plus assay is intended as an aid in the diagnosis of influenza from Nasopharyngeal swab specimens and should not be used as a sole basis for treatment. Nasal washings and aspirates are unacceptable for Xpert Xpress SARS-CoV-2/FLU/RSV testing.  Fact Sheet for Patients: EntrepreneurPulse.com.au  Fact Sheet for Healthcare Providers: IncredibleEmployment.be  This test is not yet approved or cleared by the [place] FDA and has been authorized for detection and/or diagnosis of SARS-CoV-2 by FDA under an Emergency Use Authorization (EUA). This EUA will remain in effect (meaning this test can be used) for the duration of the COVID-19 declaration under Section 564(b)(1) of the Act, 21 U.S.C. section 360bbb-3(b)(1), unless the authorization is terminated or revoked.     Resp Syncytial Virus by PCR NEGATIVE NEGATIVE Final    Comment: (NOTE) Fact Sheet for Patients: EntrepreneurPulse.com.au  Fact Sheet for Healthcare Providers: IncredibleEmployment.be  This test is not yet approved or cleared by the [place] FDA and has been authorized for detection and/or diagnosis of SARS-CoV-2 by FDA under an Emergency Use Authorization (EUA). This EUA will remain in effect (meaning this test can be used) for the duration of the COVID-19 declaration under Section 564(b)(1) of the Act, 21 U.S.C. section  360bbb-3(b)(1), unless the authorization is terminated or revoked.  Performed at [facility], [address] [place], [place] [address]      Radiology Studies: No results found.   Scheduled Meds:  apixaban  5 mg Oral BID   dapagliflozin propanediol  10 mg Oral Daily   docusate sodium  400 mg Oral BID   [START ON 02/25/2022] furosemide  40 mg Oral Daily   methadone  135  mg Oral Daily   metoprolol tartrate  100 mg Oral BID   mirtazapine  15 mg Oral QHS   Ensure Max Protein  11 oz Oral Daily   sacubitril-valsartan  1 tablet Oral BID   sodium chloride flush  3 mL Intravenous Q12H   spironolactone  12.5 mg Oral Daily   Continuous Infusions:   LOS: 2 days    Time spent: 19mn    PDomenic Polite MD [facility]   02/24/2022, 11:56 AM

## 2022-02-24 NOTE — Interval H&P Note (Signed)
History and Physical Interval Note:  02/24/2022 9:24 AM  Frank Carpenter  has presented today for surgery, with the diagnosis of AFIB.  The various methods of treatment have been discussed with the patient and family. After consideration of risks, benefits and other options for treatment, the patient has consented to  Procedure(s): CARDIOVERSION (N/A) as a surgical intervention.  The patient's history has been reviewed, patient examined, no change in status, stable for surgery.  I have reviewed the patient's chart and labs.  Questions were answered to the patient's satisfaction.     [name]

## 2022-02-24 NOTE — Telephone Encounter (Signed)
Called pt to let him know we do not have any control over him being discharged. Pt made aware he may be signed off on from a cardiology stand point but not by the hospitalist. He verbalized understanding. And requests he be made aware when he can be discharged. "I can stay in the hotel one more night but I just want to know something. I'm in room 19 at [facility]."

## 2022-02-24 NOTE — Transfer of Care (Signed)
Immediate Anesthesia Transfer of Care Note  Patient: Frank Carpenter  Procedure(s) Performed: CARDIOVERSION  Patient Location: PACU and Endoscopy Unit  Anesthesia Type:General  Level of Consciousness: drowsy  Airway & Oxygen Therapy: Patient Spontanous Breathing and Patient connected to nasal cannula oxygen  Post-op Assessment: Report given to RN and Post -op Vital signs reviewed and stable  Post vital signs: Reviewed and stable  Last Vitals:  Vitals Value Taken Time  BP    Temp    Pulse 91 02/24/22 0938  Resp 25 02/24/22 0938  SpO2 93 % 02/24/22 0938  Vitals shown include unvalidated device data.  Last Pain:  Vitals:   02/24/22 0904  TempSrc: Temporal  PainSc: 0-No pain      Patients Stated Pain Goal: 0 (29/92/42 6834)  Complications: No notable events documented.

## 2022-02-24 NOTE — Progress Notes (Signed)
Prior to transfer back to [place] patient noted tingling in left shoulder around to back. Explained he has trouble with his shoulder so pain is normal but not numbness. Alerted Dr Johnsie Cancel, EKG was normal, patient moved to wheelchair without difficulty and returned to [facility].

## 2022-02-24 NOTE — Progress Notes (Signed)
Arrived back from Cardiac Cath lab. Vitals obtained. Placed on cardiac monitoring and sitting in chair.

## 2022-02-24 NOTE — Progress Notes (Signed)
   Heart Failure Stewardship Pharmacist Progress Note   PCP: Marda Stalker, PA-C PCP-Cardiologist: Quay Burow, MD    HPI:  72 yo M with PMH of afib, HFrEF, HTN, HLD, thoracic aortic aneurysm, tobacco use, and OSA.  Admitted from 12/22-12/25 with new onset afib RVR. ECHO at that time showed EF 40-45%, global hypokinesis, RV mildly reduced. Remained in afib at outpatient follow up and was referred for outpatient cardioversion (scheduled for 02/24/22).  Presented to the ED on 1/29 with shortness of breath, weight gain, and LE edema. CXR with mild interstitial edema. Cardioversion on 2/1.   Current HF Medications: Diuretic: furosemide 80 mg IV BID Beta Blocker: metoprolol tartrate 100 mg BID ACE/ARB/ARNI: Entresto 24/26 mg BID MRA: spironolactone 12.5 mg daily SGLT2i: Farxiga 10 mg daily  Prior to admission HF Medications: Diuretic: furosemide 40 mg daily PRN Beta blocker: metoprolol tartrate 100 mg BID  Pertinent Lab Values: Serum creatinine 1.14, BUN 18, Potassium 3.7, Sodium 136, BNP 605.4  Vital Signs: Weight: 224 lbs (admission weight: 228 lbs) Blood pressure: 120/80s  Heart rate: 80-90s  I/O: -3.5L yesterday; net -11L  Medication Assistance / Insurance Benefits Check: Does the patient have prescription insurance?  Yes Type of insurance plan: UHC Medicare  Does the patient qualify for medication assistance through manufacturers or grants?   Pending Eligible grants and/or patient assistance programs: pending Medication assistance applications in progress: none  Medication assistance applications approved: none Approved medication assistance renewals will be completed by: pending  Outpatient Pharmacy:  Prior to admission outpatient [facility] Is the patient willing to use [facility] at discharge? Yes Is the patient willing to transition their outpatient pharmacy to utilize a [facility] outpatient pharmacy?   Pending    Assessment: 1. Acute on chronic  systolic CHF (LVEF 54-56%), due to presumed NICM. NYHA class III symptoms. - Continue furosemide 80 mg IV BID. Strict I/Os and daily weights. Keep K>4.  - Continue metoprolol tartrate 100 mg BID - Continue Entresto 24/26 mg BID  - Agree with adding spironolactone 12.5 mg daily - Agree with adding Farxiga 10 mg daily  Plan: 1) Medication changes recommended at this time: - Agree with changes  2) Patient assistance: Delene Loll copay $47 Wilder Glade copay $47 (generic not on insurance formulary) - Jardiance copay $47  3)  Education  - To be completed prior to discharge  Kerby Nora, PharmD, BCPS Heart Failure Cytogeneticist Phone [phone]

## 2022-02-24 NOTE — Progress Notes (Signed)
Mobility Specialist - Progress Note   02/24/22 1358  Mobility  Activity Ambulated independently in room  Level of Assistance Independent  Assistive Device None  Distance Ambulated (ft) 20 ft  Activity Response Tolerated well  Mobility Referral No  $Mobility charge 1 Mobility   Pt received in chair and agreeable to session. No complaints throughout. Pt left in chair with all needs met.  Franki Monte  Mobility Specialist Please contact via Solicitor or Rehab office at [phone]

## 2022-02-25 ENCOUNTER — Other Ambulatory Visit (HOSPITAL_COMMUNITY): Payer: Self-pay

## 2022-02-25 LAB — BASIC METABOLIC PANEL
Anion gap: 11 (ref 5–15)
BUN: 18 mg/dL (ref 8–23)
CO2: 27 mmol/L (ref 22–32)
Calcium: 9.4 mg/dL (ref 8.9–10.3)
Chloride: 97 mmol/L — ABNORMAL LOW (ref 98–111)
Creatinine, Ser: 1.05 mg/dL (ref 0.61–1.24)
GFR, Estimated: >60 mL/min (ref >60)
Glucose, Bld: 80 mg/dL (ref 70–99)
Potassium: 3.9 mmol/L (ref 3.5–5.1)
Sodium: 135 mmol/L (ref 135–145)

## 2022-02-25 MED ORDER — SPIRONOLACTONE 25 MG PO TABS
12.5000 mg | ORAL_TABLET | Freq: Every day | ORAL | 0 refills | Status: DC
  Filled 2022-02-25: qty 30, 60d supply, fill #0

## 2022-02-25 MED ORDER — DAPAGLIFLOZIN PROPANEDIOL 10 MG PO TABS
10.0000 mg | ORAL_TABLET | Freq: Every day | ORAL | 0 refills | Status: DC
  Filled 2022-02-25: qty 30, 30d supply, fill #0

## 2022-02-25 MED ORDER — SACUBITRIL-VALSARTAN 24-26 MG PO TABS
1.0000 | ORAL_TABLET | Freq: Two times a day (BID) | ORAL | 0 refills | Status: DC
  Filled 2022-02-25: qty 60, 30d supply, fill #0

## 2022-02-25 MED ORDER — FUROSEMIDE 40 MG PO TABS
40.0000 mg | ORAL_TABLET | Freq: Every day | ORAL | 0 refills | Status: DC
  Filled 2022-02-25: qty 30, 30d supply, fill #0

## 2022-02-25 NOTE — Progress Notes (Signed)
   Successful DCCV yesterday. Maintaining sinus rhythm. Transitioned to oral diuretics and GDMT for systolic HF. Labs good today, creatinine 1.05. [place] for d/c home. We have arranged follow-up.  Pixie Casino, MD, [facility], [place] Director of the Advanced Lipid Disorders &  Cardiovascular Risk Reduction Clinic Diplomate of the American Board of Clinical Lipidology Attending Cardiologist  Direct Dial: [phone]  Fax: [phone]  Website:  [email or web address]

## 2022-02-25 NOTE — Telephone Encounter (Signed)
Cardiology signed off yesterday but primary team wanted to keep him overnight. Went by to speak with patient this morning and he was very understanding. Will be discharged today.  [name], PA-C 02/25/2022 11:13 AM

## 2022-02-25 NOTE — Care Management Important Message (Signed)
Important Message  Patient Details  Name: Frank Carpenter MRN: [id number] Date of Birth: [birth year]   Medicare Important Message Given:  Yes     Shelda Altes 02/25/2022, 8:24 AM

## 2022-02-25 NOTE — Discharge Summary (Signed)
Physician Discharge Summary  Frank Carpenter NLG:[id number] DOB: 1950-03-02 DOA: 02/21/2022  PCP: Marda Stalker, PA-C  Admit date: 02/21/2022 Discharge date: 02/25/2022  Time spent: 45 minutes  Recommendations for Outpatient Follow-up:  CHF TOC clinic in 2 weeks   Discharge Diagnoses:  Principal Problem:   Acute on chronic systolic (congestive) heart failure (HCC) Active Problems:   Atrial fibrillation with RVR ([place])   Essential hypertension   Thoracic aortic aneurysm (HCC)   OSA (obstructive sleep apnea)   Chronic pain disorder   Anxiety   Discharge Condition: Improved  Diet recommendation: Low-sodium, heart healthy  Filed Weights   02/23/22 0426 02/24/22 0416 02/24/22 0904  Weight: 103.2 kg 102 kg 102 kg    History of present illness:  71/M with history of chronic systolic CHF EF 08%, paroxysmal A-fib, tobacco use, OSA, thoracic aortic aneurysm, recent admission for new onset A-fib and CHF, planned outpatient cardioversion presented to the ED with worsening shortness of breath, edema and A-fib RVR, gained 16 pounds in 1 month   Hospital Course:   Acute systolic CHF -Echo 14/48 with EF 40-45%, RV mildly reduced -Likely triggered by A-fib RVR, diuresing well on IV Lasix, 12 L negative, creatinine stable at 1.1 -Followed by cardiology this admission -Transitioned to oral Lasix, also started on Entresto, Farxiga, low-dose Aldactone -Cardiomyopathy suspected to be related to A-fib, needs repeat echo down the road to reassess for improvement in EF, will need ischemic eval if EF does not improve -CHF TOC clinic follow-up arranged   A-fib with RVR -Recent diagnosis, changed to Toprol, heart rate suboptimally controlled, underwent DC cardioversion yesterday, now stable in sinus rhythm continue Toprol and Eliquis -CHA2DS2-VASc score>3   Mild leukocytosis -Likely reactive, he is afebrile and nontoxic   Chronic pain -Continue methadone per home regimen   HTN -Continue  metoprolol   Anxiety -Continue hydroxyzine, mirtazepine   AAA -4.5 cm in 12/2021 -Followed by cardiology -Recommended for semi-annual imaging, due again in 06/2022   OSA -Continue CPAP  Discharge Exam: Vitals:   02/25/22 0454 02/25/22 0811  BP: 118/82 117/82  Pulse: 73 78  Resp: 19 19  Temp: 98.3 F (36.8 C) 98.3 F (36.8 C)  SpO2: 95% 93%   Gen: Awake, Alert, Oriented X 3,  HEENT: no JVD Lungs: Good air movement bilaterally, CTAB CVS: S1S2/RRR Abd: soft, Non tender, non distended, BS present Extremities: No edema Skin: no new rashes on exposed skin   Discharge Instructions   Discharge Instructions     Diet - low sodium heart healthy   Complete by: As directed    Increase activity slowly   Complete by: As directed       Allergies as of 02/25/2022       Reactions   Ezetimibe    Other Reaction(s): Sick feeling   Statins    Other reaction(s): Other (See Comments) Fatigue        Medication List     TAKE these medications    apixaban 5 MG Tabs tablet Commonly known as: ELIQUIS Take 1 tablet (5 mg total) by mouth 2 (two) times daily for 360 doses. Notes to patient: Blood thinner  Prevents clotting in the heart chamber and stroke    [facility] 12 Hour Nasal Decongestant 120 MG 12 hr tablet Generic drug: pseudoephedrine Take 120 mg by mouth daily as needed for congestion.   docusate sodium 100 MG capsule Commonly known as: COLACE Take 400 mg by mouth 2 (two) times daily. Notes to patient: Stool  softener    Entresto 24-26 MG Generic drug: sacubitril-valsartan Take 1 tablet by mouth 2 (two) times daily. Notes to patient: Heart failure   Farxiga 10 MG Tabs tablet Generic drug: dapagliflozin propanediol Take 1 tablet (10 mg total) by mouth daily. Notes to patient: Heart failure   fluticasone 50 MCG/ACT nasal spray Commonly known as: FLONASE Place 2 sprays into both nostrils daily as needed for allergies.   furosemide 40 MG tablet Commonly known as:  Lasix Take 1 tablet (40 mg total) by mouth daily. What changed:  when to take this reasons to take this Notes to patient: Fluid medication    hydrOXYzine 25 MG tablet Commonly known as: ATARAX Take 25 mg by mouth every 8 (eight) hours as needed for anxiety.   methadone 10 MG/ML solution Commonly known as: DOLOPHINE Take 135 mg by mouth daily. Notes to patient: Pain management    metoprolol tartrate 100 MG tablet Commonly known as: LOPRESSOR Take 1 tablet (100 mg total) by mouth 2 (two) times daily. Notes to patient: Controls cardiac rhythm Maintains regular heart rhythm   mirtazapine 15 MG tablet Commonly known as: REMERON Take 15 mg by mouth at bedtime. Notes to patient: Antidepression    multivitamin capsule Take 1 capsule by mouth daily. Notes to patient: Supplement    psyllium 28 % packet Commonly known as: METAMUCIL SMOOTH TEXTURE Take 1 packet by mouth daily as needed (constipation).   spironolactone 25 MG tablet Commonly known as: ALDACTONE Take 0.5 tablets (12.5 mg total) by mouth daily. Notes to patient: Fluid medication  Holds on to potassium    testosterone cypionate 200 MG/ML injection Commonly known as: DEPOTESTOSTERONE CYPIONATE Inject 100 mg into the muscle once a week. Thursday Notes to patient: Hormone replacement        Allergies  Allergen Reactions   Ezetimibe     Other Reaction(s): Sick feeling   Statins     Other reaction(s): Other (See Comments) Fatigue    Follow-up Information     [name], PA-C Follow up.   Specialty: Cardiology Why: Hospital follow-up with Cardiology scheduled for 03/02/2022 at 8:50am.  Please arrive 15 minutes early for check-in. If this date/time does not work for you, please call our office to reschedule. Contact information: [address] [place] [address] [phone]                  The results of significant diagnostics from this hospitalization (including imaging,  microbiology, ancillary and laboratory) are listed below for reference.    Significant Diagnostic Studies: DG Chest Portable 1 View  Result Date: 02/21/2022 CLINICAL DATA:  Shortness of breath. Irregular heart beat. Lower extremity edema. EXAM: PORTABLE CHEST 1 VIEW COMPARISON:  01/14/22 FINDINGS: stable cardiomediastinal contours. No pleural effusion identified. Mild diffuse interstitial edema. Asymmetric perihilar opacity in the mid right lung. Visualized osseous structures are unremarkable. IMPRESSION: 1. Mild interstitial edema. 2. Asymmetric perihilar opacity in the mid right lung which may reflect asymmetric edema versus infection. Electronically Signed   By: Kerby Moors M.D.   On: 02/21/2022 15:39    Microbiology: Recent Results (from the past 240 hour(s))  Resp panel by RT-PCR (RSV, Flu A&B, Covid) Anterior Nasal Swab     Status: None   Collection Time: 02/21/22  3:03 PM   Specimen: Anterior Nasal Swab  Result Value Ref Range Status   SARS Coronavirus 2 by RT PCR NEGATIVE NEGATIVE Final    Comment: (NOTE) SARS-CoV-2 target nucleic acids are  NOT DETECTED.  The SARS-CoV-2 RNA is generally detectable in upper respiratory specimens during the acute phase of infection. The lowest concentration of SARS-CoV-2 viral copies this assay can detect is 138 copies/mL. A negative result does not preclude SARS-Cov-2 infection and should not be used as the sole basis for treatment or other patient management decisions. A negative result may occur with  improper specimen collection/handling, submission of specimen other than nasopharyngeal swab, presence of viral mutation(s) within the areas targeted by this assay, and inadequate number of viral copies(<138 copies/mL). A negative result must be combined with clinical observations, patient history, and epidemiological information. The expected result is Negative.  Fact Sheet for Patients:  EntrepreneurPulse.com.au  Fact  Sheet for Healthcare Providers:  IncredibleEmployment.be  This test is no t yet approved or cleared by the [place] FDA and  has been authorized for detection and/or diagnosis of SARS-CoV-2 by FDA under an Emergency Use Authorization (EUA). This EUA will remain  in effect (meaning this test can be used) for the duration of the COVID-19 declaration under Section 564(b)(1) of the Act, 21 U.S.C.section 360bbb-3(b)(1), unless the authorization is terminated  or revoked sooner.       Influenza A by PCR NEGATIVE NEGATIVE Final   Influenza B by PCR NEGATIVE NEGATIVE Final    Comment: (NOTE) The Xpert Xpress SARS-CoV-2/FLU/RSV plus assay is intended as an aid in the diagnosis of influenza from Nasopharyngeal swab specimens and should not be used as a sole basis for treatment. Nasal washings and aspirates are unacceptable for Xpert Xpress SARS-CoV-2/FLU/RSV testing.  Fact Sheet for Patients: EntrepreneurPulse.com.au  Fact Sheet for Healthcare Providers: IncredibleEmployment.be  This test is not yet approved or cleared by the [place] FDA and has been authorized for detection and/or diagnosis of SARS-CoV-2 by FDA under an Emergency Use Authorization (EUA). This EUA will remain in effect (meaning this test can be used) for the duration of the COVID-19 declaration under Section 564(b)(1) of the Act, 21 U.S.C. section 360bbb-3(b)(1), unless the authorization is terminated or revoked.     Resp Syncytial Virus by PCR NEGATIVE NEGATIVE Final    Comment: (NOTE) Fact Sheet for Patients: EntrepreneurPulse.com.au  Fact Sheet for Healthcare Providers: IncredibleEmployment.be  This test is not yet approved or cleared by the [place] FDA and has been authorized for detection and/or diagnosis of SARS-CoV-2 by FDA under an Emergency Use Authorization (EUA). This EUA will remain in effect  (meaning this test can be used) for the duration of the COVID-19 declaration under Section 564(b)(1) of the Act, 21 U.S.C. section 360bbb-3(b)(1), unless the authorization is terminated or revoked.  Performed at [facility], [address] [place], [place] [address]      Labs: Basic Metabolic Panel: Recent Labs  Lab 02/21/22 1515 02/23/22 0411 02/24/22 0153 02/25/22 0349  NA 138 138 136 135  K 4.2 4.2 3.7 3.9  CL 102 96* 95* 97*  CO2 27 32 28 27  GLUCOSE 107* 95 83 80  BUN '18 13 18 18  '$ CREATININE 0.91 1.06 1.14 1.05  CALCIUM 9.0 9.5 9.3 9.4   Liver Function Tests: Recent Labs  Lab 02/21/22 1515  AST 25  ALT 43  ALKPHOS 86  BILITOT 1.0  PROT 6.5  ALBUMIN 3.9   No results for input(s): "LIPASE", "AMYLASE" in the last 168 hours. No results for input(s): "AMMONIA" in the last 168 hours. CBC: Recent Labs  Lab 02/21/22 1515 02/23/22 0411  WBC 12.3* 13.7*  NEUTROABS 9.3*  --  HGB 15.7 18.1*  HCT 46.6 52.4*  MCV 95.9 94.6  PLT 254 271   Cardiac Enzymes: No results for input(s): "CKTOTAL", "CKMB", "CKMBINDEX", "TROPONINI" in the last 168 hours. BNP: BNP (last 3 results) Recent Labs    01/15/22 1847 02/21/22 1515  BNP 310.9* 605.4*    ProBNP (last 3 results) No results for input(s): "PROBNP" in the last 8760 hours.  CBG: No results for input(s): "GLUCAP" in the last 168 hours.  Signed:  Domenic Polite MD.  [facility] 02/25/2022, 2:38 PM

## 2022-02-25 NOTE — Progress Notes (Signed)
Discharge instructions given. Patient verbalized understanding and all questions were answered. Patient going to the discharge lounge.

## 2022-02-25 NOTE — Progress Notes (Signed)
   Heart Failure Stewardship Pharmacist Progress Note   PCP: Marda Stalker, PA-C PCP-Cardiologist: Quay Burow, MD    HPI:  72 yo M with PMH of afib, HFrEF, HTN, HLD, thoracic aortic aneurysm, tobacco use, and OSA.  Admitted from 12/22-12/25 with new onset afib RVR. ECHO at that time showed EF 40-45%, global hypokinesis, RV mildly reduced. Remained in afib at outpatient follow up and was referred for outpatient cardioversion (scheduled for 02/24/22).  Presented to the ED on 1/29 with shortness of breath, weight gain, and LE edema. CXR with mild interstitial edema. S/p successful cardioversion on 2/1.   Discharge HF Medications: Diuretic: furosemide 40 mg daily Beta Blocker: metoprolol tartrate 100 mg BID ACE/ARB/ARNI: Entresto 24/26 mg BID MRA: spironolactone 12.5 mg daily SGLT2i: Farxiga 10 mg daily  Prior to admission HF Medications: Diuretic: furosemide 40 mg daily PRN Beta blocker: metoprolol tartrate 100 mg BID  Pertinent Lab Values: Serum creatinine 1.05, BUN 18, Potassium 3.9, Sodium 135, BNP 605.4  Vital Signs: Weight: 224 lbs (admission weight: 228 lbs) Blood pressure: 110/80s  Heart rate: 70-80s  I/O: -1.7L yesterday; net -11.7L  Medication Assistance / Insurance Benefits Check: Does the patient have prescription insurance?  Yes Type of insurance plan: UHC Medicare  Does the patient qualify for medication assistance through manufacturers or grants?   Pending Eligible grants and/or patient assistance programs: pending Medication assistance applications in progress: none  Medication assistance applications approved: none Approved medication assistance renewals will be completed by: pending  Outpatient Pharmacy:  [facility] Is the patient willing to use [facility] at discharge? Yes Is the patient willing to transition their outpatient pharmacy to utilize a [facility] outpatient pharmacy?   Pending    Assessment: 1.  Acute on chronic systolic CHF (LVEF 81-44%), due to presumed NICM. NYHA class III symptoms. - Continue furosemide 40 mg daily at discharge. Strict I/Os and daily weights. Keep K>4.  - Continue metoprolol tartrate 100 mg BID - Continue Entresto 24/26 mg BID  - Continue spironolactone 12.5 mg daily - Continue Farxiga 10 mg daily  Plan: 1) Medication changes recommended at this time: - Discharge today  2) Patient assistance: Delene Loll copay $47 - Farxiga copay $47 (generic not on insurance formulary) - Jardiance copay $47  3)  Education  - Patient has been educated on current HF medications and potential additions to HF medication regimen - Patient verbalizes understanding that over the next few months, these medication doses may change and more medications may be added to optimize HF regimen - Patient has been educated on basic disease state pathophysiology and goals of therapy   Kerby Nora, PharmD, BCPS Heart Failure Stewardship Pharmacist Phone [phone]

## 2022-02-25 NOTE — Discharge Instructions (Signed)
Heart Failure Education: Weigh yourself EVERY morning after you go to the bathroom but before you eat or drink anything. Write this number down in a weight log/diary. If you gain 3 pounds overnight or 5 pounds in a week, call the office. Take your medicines as prescribed. If you have concerns about your medications, please call us before you stop taking them.  Eat low salt foods--Limit salt (sodium) to 2000 mg per day. This will help prevent your body from holding onto fluid. Read food labels as many processed foods have a lot of sodium, especially canned goods and prepackaged meats. If you would like some assistance choosing low sodium foods, we would be happy to set you up with a nutritionist. Limit all fluids for the day to less than 2 liters (64 ounces). Fluid includes all drinks, coffee, juice, ice chips, soup, jello, and all other liquids. Stay as active as you can everyday. Staying active will give you more energy and make your muscles stronger. Start with 5 minutes at a time and work your way up to 30 minutes a day. Break up your activities--do some in the morning and some in the afternoon. Start with 3 days per week and work your way up to 5 days as you can.  If you have chest pain, feel short of breath, dizzy, or lightheaded, STOP. If you don't feel better after a short rest, call 911. If you do feel better, call the office to let us know you have symptoms with exercise.

## 2022-03-02 ENCOUNTER — Ambulatory Visit: Payer: Medicare Other | Attending: Family | Admitting: General Practice

## 2022-03-02 ENCOUNTER — Encounter: Payer: Self-pay | Admitting: General Practice

## 2022-03-02 ENCOUNTER — Ambulatory Visit: Payer: Medicare Other | Admitting: Student

## 2022-03-02 VITALS — BP 116/82 | HR 73 | Ht 72.0 in | Wt 221.0 lb

## 2022-03-02 DIAGNOSIS — I7121 Aneurysm of the ascending aorta, without rupture: Secondary | ICD-10-CM | POA: Diagnosis not present

## 2022-03-02 DIAGNOSIS — I5022 Chronic systolic (congestive) heart failure: Secondary | ICD-10-CM

## 2022-03-02 DIAGNOSIS — I10 Essential (primary) hypertension: Secondary | ICD-10-CM | POA: Diagnosis not present

## 2022-03-02 DIAGNOSIS — G4733 Obstructive sleep apnea (adult) (pediatric): Secondary | ICD-10-CM

## 2022-03-02 DIAGNOSIS — D6859 Other primary thrombophilia: Secondary | ICD-10-CM | POA: Diagnosis not present

## 2022-03-02 DIAGNOSIS — I480 Paroxysmal atrial fibrillation: Secondary | ICD-10-CM

## 2022-03-02 IMAGING — CT CT CARDIAC CORONARY ARTERY CALCIUM SCORE
3 series · 14 of 20 positions shown, 15 images · non-contrast
Comparison: None.
COMPARISON: None.

Addendum:
EXAM:
OVER-READ INTERPRETATION  CT CHEST

The following report is an over-read performed by radiologist Dr.
Ajai Sakai [REDACTED] on 06/26/2019. This
over-read does not include interpretation of cardiac or coronary
anatomy or pathology. The coronary calcium score interpretation by
the cardiologist is attached.
CLINICAL DATA: Risk stratification
Coronary Calcium Score
TECHNIQUE: The patient was scanned on a Siemens Somatom 64 slice scanner. Axial
non-contrast 3 mm slices were carried out through the heart. The
data set was analyzed on a dedicated work station and scored using
the Agatson method.

[Series 2: casc 3.0 bv41 2 bestdiast 71 % · axial · 0.37mm/px · z∈[-251,-167]mm · 4 of 48 slices shown, 5 images]
[im 10/48  vessel]
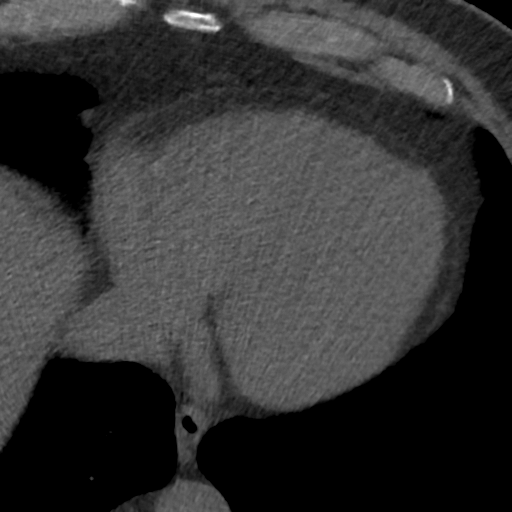
[im 10/48  lung]
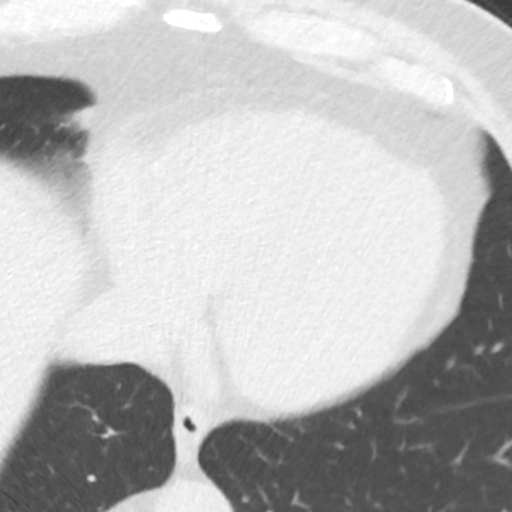
[im 19/48  vessel]
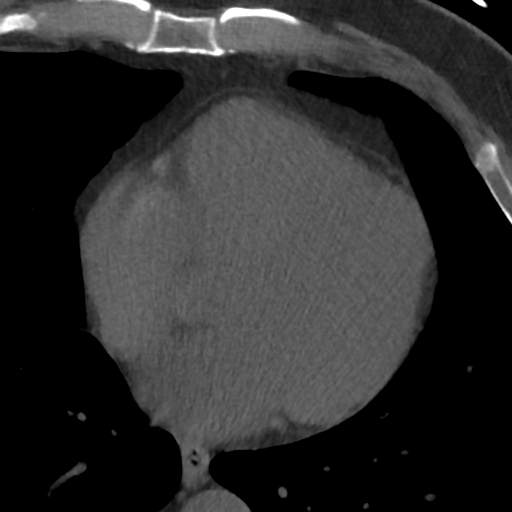
[im 29/48  vessel]
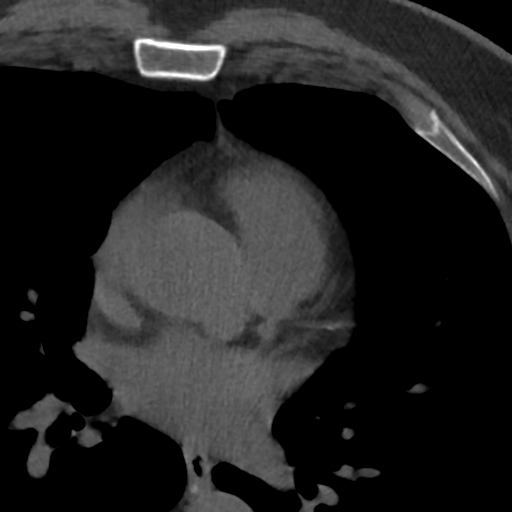
[im 38/48  vessel]
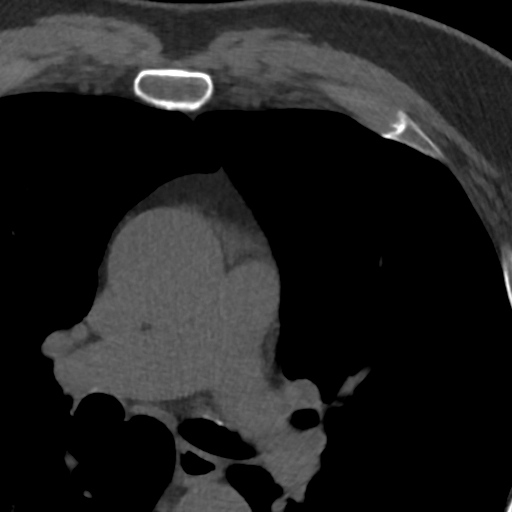

[Series 3: lung 72 % · axial · 0.76mm/px · z∈[-257,-161]mm · 5 of 49 slices shown]
[im 9/49  lung]
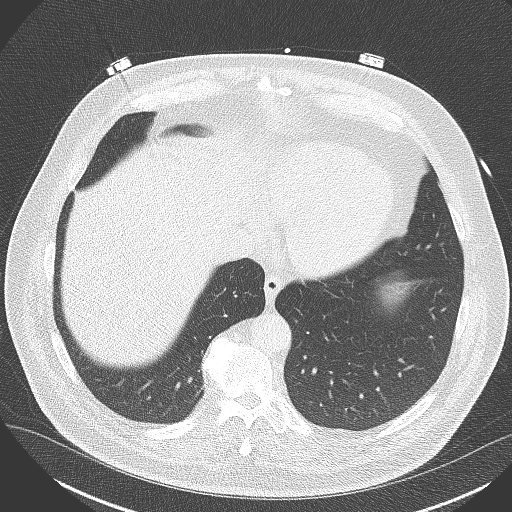
[im 17/49  lung]
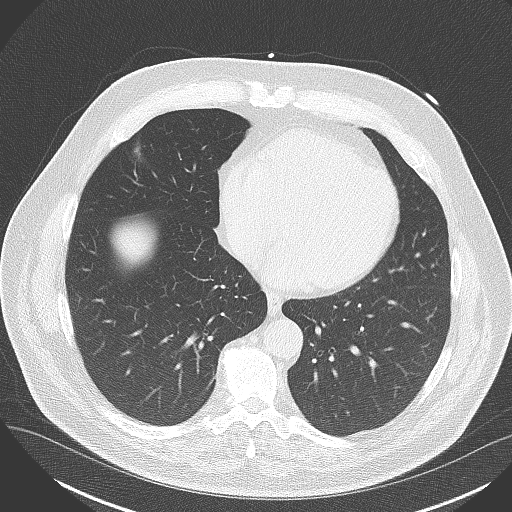
[im 25/49  lung]
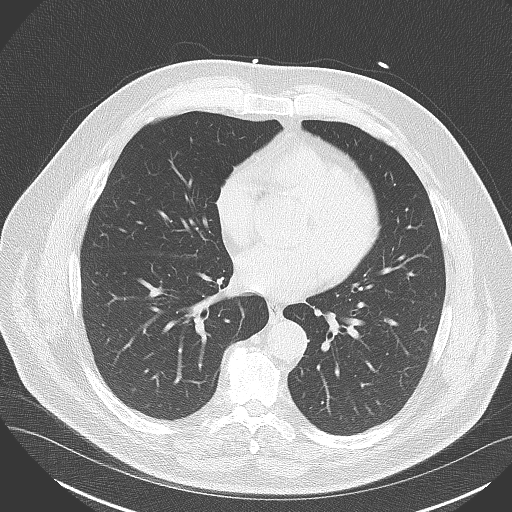
[im 33/49  lung]
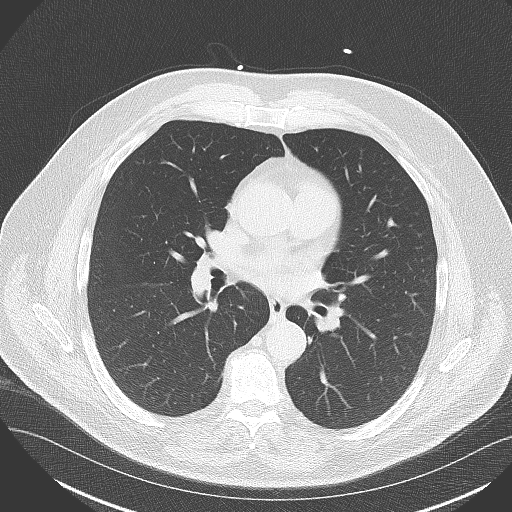
[im 41/49  lung]
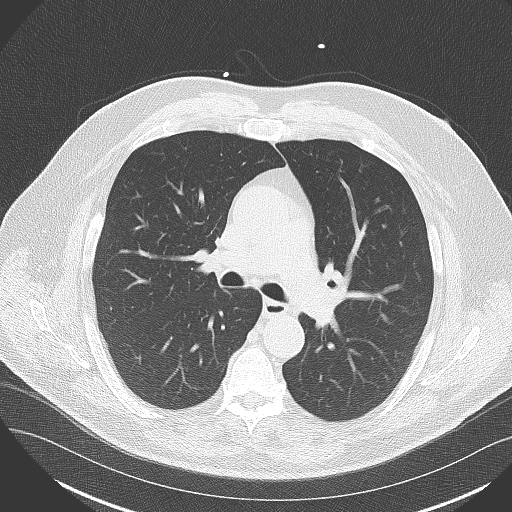

[Series 4: lung st 72 % · axial · 0.76mm/px · z∈[-257,-161]mm · 5 of 49 slices shown]
[im 9/49  lung]
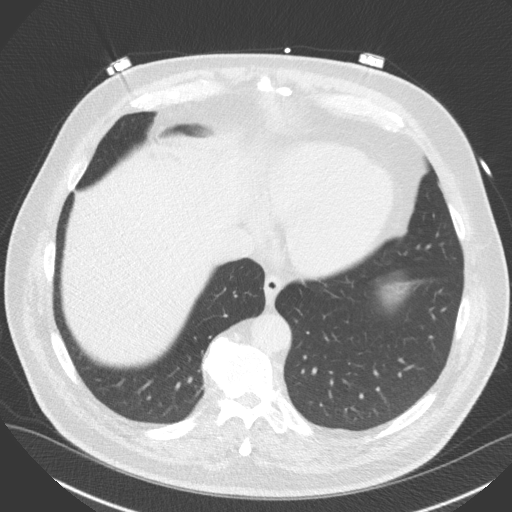
[im 17/49  lung]
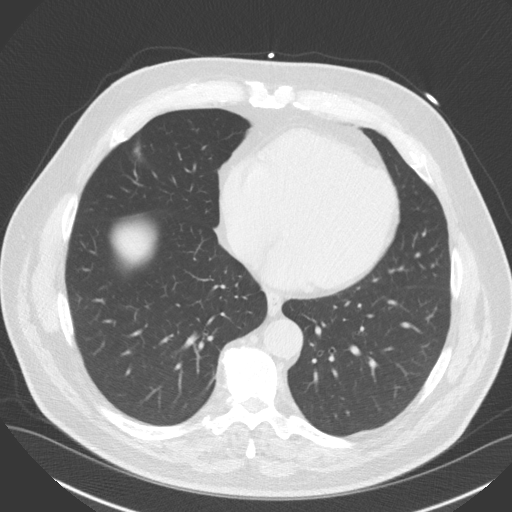
[im 25/49  lung]
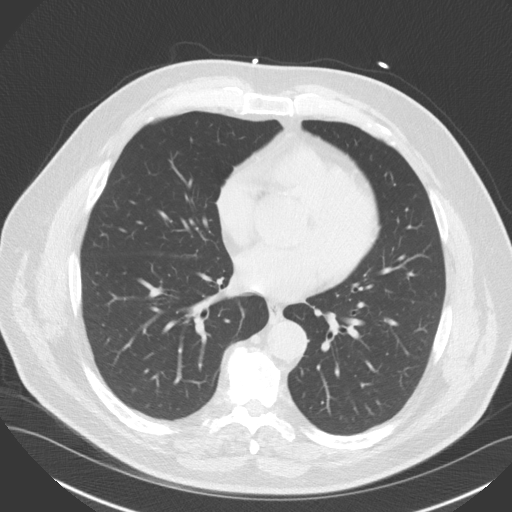
[im 33/49  lung]
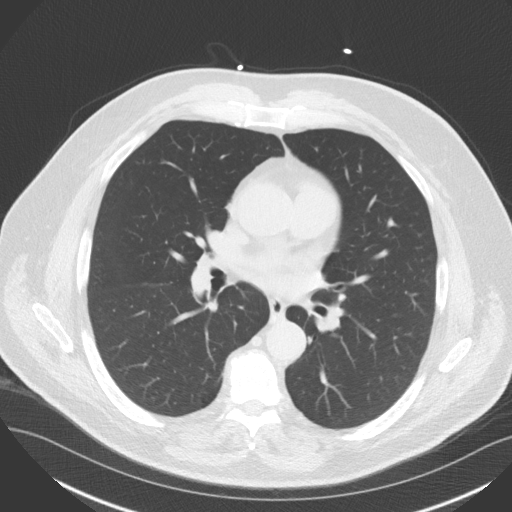
[im 41/49  lung]
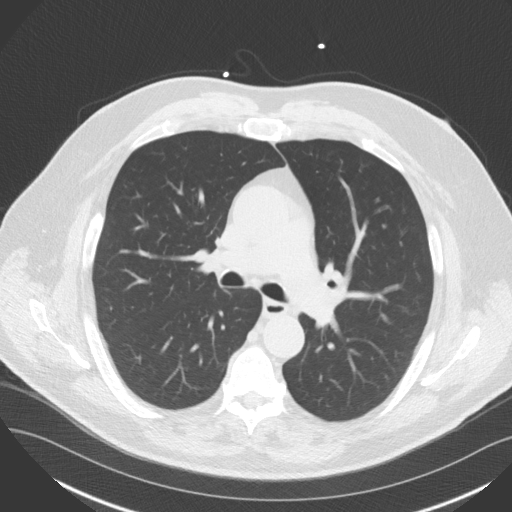

[14 of 20 positions shown; findings below may reference images not displayed]

FINDINGS: Within the visualized portions of the thorax there are no suspicious
appearing pulmonary nodules or masses, there is no acute
consolidative airspace disease, no pleural effusions, no
pneumothorax and no lymphadenopathy. Visualized portions of the
upper abdomen are unremarkable. There are no aggressive appearing
lytic or blastic lesions noted in the visualized portions of the
skeleton.
IMPRESSION: 1. No significant incidental noncardiac findings are noted.
FINDINGS: Non-cardiac: See separate report from [REDACTED].

Ascending aorta: Mild aortic root dilatation 4.1 cm

Pericardium: Normal

Coronary arteries: No coronary calcium noted
IMPRESSION: Coronary calcium score of 0.

Suggest f/u CTA of aorta in 6 months

Sathya Vermillion

*** End of Addendum ***
EXAM:
OVER-READ INTERPRETATION  CT CHEST

The following report is an over-read performed by radiologist Dr.
Ajai Sakai [REDACTED] on 06/26/2019. This
over-read does not include interpretation of cardiac or coronary
anatomy or pathology. The coronary calcium score interpretation by
the cardiologist is attached.
FINDINGS: Within the visualized portions of the thorax there are no suspicious
appearing pulmonary nodules or masses, there is no acute
consolidative airspace disease, no pleural effusions, no
pneumothorax and no lymphadenopathy. Visualized portions of the
upper abdomen are unremarkable. There are no aggressive appearing
lytic or blastic lesions noted in the visualized portions of the
skeleton.
IMPRESSION: 1. No significant incidental noncardiac findings are noted.

## 2022-03-02 MED ORDER — METOPROLOL TARTRATE 100 MG PO TABS: 100.0000 mg | ORAL_TABLET | Freq: Two times a day (BID) | ORAL | 3 refills | Status: DC

## 2022-03-02 NOTE — Patient Instructions (Addendum)
Medication Instructions:  Your physician recommends that you continue on your current medications as directed. Please refer to the Current Medication list given to you today.  *If you need a refill on your cardiac medications before your next appointment, please call your pharmacy*  Please weigh yourself daily. If you gain 3lbs in a day and 5 lbs in a week you may take an extra dosage of your lasix.   Lab Work: NONE If you have labs (blood work) drawn today and your tests are completely normal, you will receive your results only by: [place] (if you have MyChart) OR A paper copy in the mail If you have any lab test that is abnormal or we need to change your treatment, we will call you to review the results.   Testing/Procedures: Your physician has requested that you have an echocardiogram in 1.5 months. Echocardiography is a painless test that uses sound waves to create images of your heart. It provides your doctor with information about the size and shape of your heart and how well your heart's chambers and valves are working. This procedure takes approximately one hour. There are no restrictions for this procedure. Please do NOT wear cologne, perfume, aftershave, or lotions (deodorant is allowed). Please arrive 15 minutes prior to your appointment time.    Follow-Up: At [facility], you and your health needs are our priority.  As part of our continuing mission to provide you with exceptional heart care, we have created designated Provider Care Teams.  These Care Teams include your primary Cardiologist (physician) and Advanced Practice Providers (APPs -  Physician Assistants and Nurse Practitioners) who all work together to provide you with the care you need, when you need it.  We recommend signing up for the patient portal called "MyChart".  Sign up information is provided on this After Visit Summary.  MyChart is used to connect with patients for Virtual Visits  (Telemedicine).  Patients are able to view lab/test results, encounter notes, upcoming appointments, etc.  Non-urgent messages can be sent to your provider as well.   To learn more about what you can do with MyChart, go to [email or web address].    Your next appointment:   3-4 month(s)  Provider:   Coletta Memos, FNP or  Quay Burow, MD

## 2022-03-02 NOTE — Progress Notes (Signed)
Cardiology Clinic Note   Patient Name: Frank Carpenter Date of Encounter: 03/02/2022  Primary Care Provider:  Marda Stalker, PA-C Primary Cardiologist:  Quay Burow, MD  Patient Profile    Frank Carpenter 72 year old male presents the clinic today for follow-up evaluation of his atrial fibrillation with RVR.  Past Medical History    Past Medical History:  Diagnosis Date   Atrial fibrillation, chronic (HCC)    Chronic systolic (congestive) heart failure (HCC)    High cholesterol    OSA (obstructive sleep apnea)    Testosterone deficiency    Past Surgical History:  Procedure Laterality Date   CARDIOVERSION N/A 02/24/2022   Procedure: CARDIOVERSION;  Surgeon: Josue Hector, MD;  Location: [facility] ENDOSCOPY;  Service: Cardiovascular;  Laterality: N/A;   NASAL SEPTUM SURGERY  04/2021    Allergies  Allergies  Allergen Reactions   Ezetimibe     Other Reaction(s): Sick feeling   Statins     Other reaction(s): Other (See Comments) Fatigue    History of Present Illness    Frank Carpenter has a PMH of HTN, thoracic aortic aneurysm, atrial fibrillation with RVR, chronic diastolic CHF EF 43%, OSA, chronic pain disorder, anxiety, tobacco abuse, hyperlipidemia, and left shoulder pain.  His PMH also includes abdominal aortic aneurysm which measured 4.5 cm 12/23.  CHA2DS2-VASc score 3  He was admitted to the hospital on 02/22/2022 and discharged on 02/25/2022.  He reported a 16 pound weight gain in 1 month and worsening shortness of breath.  He was noted to have lower extremity edema and EKG showed atrial fibrillation with RVR.  His echocardiogram 12/23 showed an EF of 40-45% with mildly reduced RV function.  He received IV diuresis and was able to diurese 12 L.  His creatinine remained stable at 1.1.  He was transitioned to p.o. furosemide and started on Entresto, Farxiga and low-dose Aldactone.  His cardiomyopathy was felt to be related to his atrial fibrillation and repeat  echocardiogram was planned for after DCCV.  He underwent cardioversion on 02/24/2022.  He underwent 1 shock at 150 J and a second shock of 200 J.  He was converted to sinus rhythm with occasional PVCs and PACs 83 bpm  He presents to the clinic today for follow-up evaluation and states he feels well today.  We reviewed his recent hospitalization and cardioversion.  He expressed understanding.  We reviewed his medications.  He expressed appreciation.  His weight today is 221 pounds.  He reports compliance with his medications.  His blood pressure is well-controlled at 116/82.  We reviewed guideline directed medical therapy.  Due to his blood pressure I do not have room to uptitrate medication at this time.  It was felt that his cardiomyopathy was in part due to his atrial fibrillation.  I will continue his current medication regimen, recommended daily weights, will give the salty 6 diet sheet and plan for repeat echocardiogram in 1-1/2 months.  I will also give him the [place] support stocking sheet.  We will have him do a weight log as well.  Will plan follow-up in 3 to 4 months.  Today he denies chest pain, shortness of breath, lower extremity edema, fatigue, palpitations, melena, hematuria, hemoptysis, diaphoresis, weakness, presyncope, syncope, orthopnea, and PND.   Home Medications    Prior to Admission medications   Medication Sig Start Date End Date Taking? Authorizing Provider  apixaban (ELIQUIS) 5 MG TABS tablet Take 1 tablet (5 mg total) by  mouth 2 (two) times daily for 360 doses. 01/19/22 07/18/22  Loel Dubonnet, NP  [facility] 12 HOUR NASAL DECONGESTANT 120 MG 12 hr tablet Take 120 mg by mouth daily as needed for congestion. 06/24/21   [provider]  dapagliflozin propanediol (FARXIGA) 10 MG TABS tablet Take 1 tablet (10 mg total) by mouth daily. 02/25/22   Domenic Polite, MD  docusate sodium (COLACE) 100 MG capsule Take 400 mg by mouth 2 (two) times daily.    [provider]   fluticasone (FLONASE) 50 MCG/ACT nasal spray Place 2 sprays into both nostrils daily as needed for allergies. 02/23/21   [provider]  furosemide (LASIX) 40 MG tablet Take 1 tablet (40 mg total) by mouth daily. 02/25/22 03/27/22  Domenic Polite, MD  hydrOXYzine (ATARAX) 25 MG tablet Take 25 mg by mouth every 8 (eight) hours as needed for anxiety. 04/27/21   [provider]  methadone (DOLOPHINE) 10 MG/ML solution Take 135 mg by mouth daily.    [provider]  metoprolol tartrate (LOPRESSOR) 100 MG tablet Take 1 tablet (100 mg total) by mouth 2 (two) times daily. 01/19/22 04/19/22  Loel Dubonnet, NP  mirtazapine (REMERON) 15 MG tablet Take 15 mg by mouth at bedtime. 11/06/21   [provider]  Multiple Vitamin (MULTIVITAMIN) capsule Take 1 capsule by mouth daily.    [provider]  psyllium (METAMUCIL SMOOTH TEXTURE) 28 % packet Take 1 packet by mouth daily as needed (constipation).    [provider]  sacubitril-valsartan (ENTRESTO) 24-26 MG Take 1 tablet by mouth 2 (two) times daily. 02/25/22   Domenic Polite, MD  spironolactone (ALDACTONE) 25 MG tablet Take 0.5 tablets (12.5 mg total) by mouth daily. 02/25/22   Domenic Polite, MD  testosterone cypionate (DEPOTESTOSTERONE CYPIONATE) 200 MG/ML injection Inject 100 mg into the muscle once a week. Thursday 04/22/19   [provider]    Family History    Family History  Problem Relation Age of Onset   Bladder Cancer Mother    Hypertension Father    He indicated that the status of his mother is unknown. He indicated that the status of his father is unknown.  Social History    Social History   Socioeconomic History   Marital status: Married    Spouse name: Not on file   Number of children: Not on file   Years of education: Not on file   Highest education level: Not on file  Occupational History   Occupation: retired  Tobacco Use   Smoking status: Former    Packs/day: 1.00     Years: 15.00    Total pack years: 15.00    Types: Cigarettes    Quit date: 2004    Years since quitting: 20.1   Smokeless tobacco: Never  Substance and Sexual Activity   Alcohol use: Yes    Alcohol/week: 2.0 standard drinks of alcohol    Types: 2 Cans of beer per week    Comment: rare   Drug use: Not Currently    Types: Marijuana    Comment: hydrocodone prescribed for pain   Sexual activity: Not on file  Other Topics Concern   Not on file  Social History Narrative   Not on file   Social Determinants of Health   Financial Resource Strain: Not on file  Food Insecurity: No Food Insecurity (02/22/2022)   Hunger Vital Sign    Worried About Running Out of Food in the Last Year: Never true  Ran Out of Food in the Last Year: Never true  Transportation Needs: No Transportation Needs (02/22/2022)   PRAPARE - Hydrologist (Medical): No    Lack of Transportation (Non-Medical): No  Physical Activity: Not on file  Stress: Not on file  Social Connections: Not on file  Intimate Partner Violence: Not At Risk (02/22/2022)   Humiliation, Afraid, Rape, and Kick questionnaire    Fear of Current or Ex-Partner: No    Emotionally Abused: No    Physically Abused: No    Sexually Abused: No     Review of Systems    General:  No chills, fever, night sweats or weight changes.  Cardiovascular:  No chest pain, dyspnea on exertion, edema, orthopnea, palpitations, paroxysmal nocturnal dyspnea. Dermatological: No rash, lesions/masses Respiratory: No cough, dyspnea Urologic: No hematuria, dysuria Abdominal:   No nausea, vomiting, diarrhea, bright red blood per rectum, melena, or hematemesis Neurologic:  No visual changes, wkns, changes in mental status. All other systems reviewed and are otherwise negative except as noted above.  Physical Exam    VS:  BP 116/82   Pulse 73   Ht 6' (1.829 m)   Wt 221 lb (100.2 kg)   SpO2 95%   BMI 29.97 kg/m  , BMI Body mass  index is 29.97 kg/m. GEN: Well nourished, well developed, in no acute distress. HEENT: normal. Neck: Supple, no JVD, carotid bruits, or masses. Cardiac: RRR, no murmurs, rubs, or gallops. No clubbing, cyanosis, edema.  Radials/DP/PT 2+ and equal bilaterally.  Respiratory:  Respirations regular and unlabored, clear to auscultation bilaterally. GI: Soft, nontender, nondistended, BS + x 4. MS: no deformity or atrophy. Skin: warm and dry, no rash. Neuro:  Strength and sensation are intact. Psych: Normal affect.  Accessory Clinical Findings    Recent Labs: 01/15/2022: TSH 1.219 01/17/2022: Magnesium 1.9 02/21/2022: ALT 43; B Natriuretic Peptide 605.4 02/23/2022: Hemoglobin 18.1; Platelets 271 02/25/2022: BUN 18; Creatinine, Ser 1.05; Potassium 3.9; Sodium 135   Recent Lipid Panel    Component Value Date/Time   CHOL 151 01/15/2022 1847   TRIG 81 01/15/2022 1847   HDL 44 01/15/2022 1847   CHOLHDL 3.4 01/15/2022 1847   VLDL 16 01/15/2022 1847   LDLCALC 91 01/15/2022 1847         ECG personally reviewed by me today-sinus rhythm with possible premature atrial complexes with aberrant conduction 73 bpm- No acute changes  Echocardiogram 01/15/2022  IMPRESSIONS     1. Left ventricular ejection fraction, by estimation, is 40 to 45%. The  left ventricle has mildly decreased function. The left ventricle  demonstrates global hypokinesis. The left ventricular internal cavity size  was mildly dilated. Left ventricular  diastolic parameters are indeterminate.   2. Right ventricular systolic function is mildly reduced. The right  ventricular size is mildly enlarged. There is normal pulmonary artery  systolic pressure.   3. Left atrial size was moderately dilated.   4. Right atrial size was moderately dilated.   5. The mitral valve is abnormal. Trivial mitral valve regurgitation. No  evidence of mitral stenosis.   6. The aortic valve is tricuspid. There is moderate calcification of the   aortic valve. There is moderate thickening of the aortic valve. Aortic  valve regurgitation is not visualized. Aortic valve sclerosis is present,  with no evidence of aortic valve  stenosis.   7. The inferior vena cava is normal in size with greater than 50%  respiratory variability, suggesting right atrial pressure of  3 mmHg.   FINDINGS   Left Ventricle: Left ventricular ejection fraction, by estimation, is 40  to 45%. The left ventricle has mildly decreased function. The left  ventricle demonstrates global hypokinesis. The left ventricular internal  cavity size was mildly dilated. There is   no left ventricular hypertrophy. Left ventricular diastolic parameters  are indeterminate.   Right Ventricle: The right ventricular size is mildly enlarged. No  increase in right ventricular wall thickness. Right ventricular systolic  function is mildly reduced. There is normal pulmonary artery systolic  pressure. The tricuspid regurgitant velocity   is 2.46 m/s, and with an assumed right atrial pressure of 3 mmHg, the  estimated right ventricular systolic pressure is 81.8 mmHg.   Left Atrium: Left atrial size was moderately dilated.   Right Atrium: Right atrial size was moderately dilated.   Pericardium: There is no evidence of pericardial effusion.   Mitral Valve: The mitral valve is abnormal. There is mild thickening of  the mitral valve leaflet(s). There is mild calcification of the mitral  valve leaflet(s). Trivial mitral valve regurgitation. No evidence of  mitral valve stenosis.   Tricuspid Valve: The tricuspid valve is normal in structure. Tricuspid  valve regurgitation is trivial. No evidence of tricuspid stenosis.   Aortic Valve: The aortic valve is tricuspid. There is moderate  calcification of the aortic valve. There is moderate thickening of the  aortic valve. Aortic valve regurgitation is not visualized. Aortic valve  sclerosis is present, with no evidence of  aortic  valve stenosis. Aortic valve mean gradient measures 2.2 mmHg.  Aortic valve peak gradient measures 4.3 mmHg. Aortic valve area, by VTI  measures 1.97 cm.   Pulmonic Valve: The pulmonic valve was normal in structure. Pulmonic valve  regurgitation is not visualized. No evidence of pulmonic stenosis.   Aorta: The aortic root is normal in size and structure.   Venous: The inferior vena cava is normal in size with greater than 50%  respiratory variability, suggesting right atrial pressure of 3 mmHg.   IAS/Shunts: No atrial level shunt detected by color flow Doppler.       Assessment & Plan   1.  Atrial fibrillation-EKG today shows sinus rhythm with possible premature and atrial complexes with aberrant conduction 73 bpm.  Denies further episodes of irregular or accelerated heart rate.  Compliant with apixaban and denies bleeding issues. Continue metoprolol, apixaban Void triggers caffeine, chocolate, EtOH, dehydration etc.  Acute systolic CHF-returning to normal activities.  Echocardiogram 1223 showed an EF of 40-45% with mildly reduced RV function.  Systolic function decreased felt to be in part due to atrial fibrillation.  Patient diuresed 12 L during hospital admission.  Weight stable.  Euvolemic. Plan for repeat echocardiogram once GDMT has been uptitrated x 1 month. Continue furosemide, Entresto, Farxiga, Aldactone, metoprolol Heart healthy low-sodium diet-salty 6 given Daily weights-contact office with a weight increase of 2 to 3 pounds overnight or 5 pounds in 1 week.  May take an extra dose of furosemide. Lower extremity support stockings sheet given  Essential hypertension-BP today 116/82 Continue metoprolol, Entresto, spironolactone Heart healthy low-sodium diet-salty 6 given Increase physical activity as tolerated  OSA-reports compliance with CPAP.  Waking up well rested. Continue CPAP use Weight loss encouraged  Abdominal aortic aneurysm-noted to be 4.5 cm 12/23. Repeat  imaging 6/24  Disposition: Follow-up with Dr.Berry or me in 3-4 months.   Jossie Ng. Jakari Jacot NP-C     03/02/2022, 12:12 PM [place]  [address] [phone] Fax [phone]    I spent 15 minutes examining this patient, reviewing medications, and using patient centered shared decision making involving her cardiac care.  Prior to her visit I spent greater than 20 minutes reviewing her past medical history,  medications, and prior cardiac tests.

## 2022-03-04 NOTE — Addendum Note (Signed)
Addended by: Claude Manges on: 03/04/2022 08:43 AM   Modules accepted: Orders

## 2022-03-08 DIAGNOSIS — M5416 Radiculopathy, lumbar region: Secondary | ICD-10-CM | POA: Diagnosis not present

## 2022-03-10 ENCOUNTER — Ambulatory Visit (HOSPITAL_BASED_OUTPATIENT_CLINIC_OR_DEPARTMENT_OTHER): Payer: Medicare Other | Admitting: Family

## 2022-03-11 ENCOUNTER — Ambulatory Visit: Payer: Medicare Other | Admitting: Adult Health

## 2022-03-11 ENCOUNTER — Telehealth: Payer: Self-pay | Admitting: Cardiovascular Disease

## 2022-03-11 NOTE — Telephone Encounter (Signed)
Pt stated that for the past 3 days, his smart watch has alerted him of afib. Denies SOB, dizziness, edema. Reports fatigue. BP while on phone 96/64, P 84. He was not sure of BP cuff. He drinks 8-9 bottles of water a day. Dr. Gwenlyn Found informed and recommended pat be seen next week. Appointment made for 2/19 with C. Walker. Recommended to pat that if he becomes symptomatic, to go to [facility] ED. Gave him on-call number for the weekend as well.

## 2022-03-11 NOTE — Telephone Encounter (Signed)
Patient c/o Palpitations:  High priority if patient c/o lightheadedness, shortness of breath, or chest pain  How long have you had palpitations/irregular HR/ Afib? Are you having the symptoms now? Started 02/14, is having symptoms now.   Are you currently experiencing lightheadedness, SOB or CP? No   Do you have a history of afib (atrial fibrillation) or irregular heart rhythm? Yes  Have you checked your BP or HR? (document readings if available): HR ranging 82-90's  Are you experiencing any other symptoms? Tired    Is requesting an appt with an EKG regarding this.

## 2022-03-14 ENCOUNTER — Encounter (HOSPITAL_BASED_OUTPATIENT_CLINIC_OR_DEPARTMENT_OTHER): Payer: Self-pay | Admitting: Family

## 2022-03-14 ENCOUNTER — Ambulatory Visit (HOSPITAL_BASED_OUTPATIENT_CLINIC_OR_DEPARTMENT_OTHER): Payer: Medicare Other | Admitting: Family

## 2022-03-14 VITALS — BP 100/68 | HR 88 | Ht 72.0 in | Wt 222.0 lb

## 2022-03-14 DIAGNOSIS — G4733 Obstructive sleep apnea (adult) (pediatric): Secondary | ICD-10-CM

## 2022-03-14 DIAGNOSIS — D6859 Other primary thrombophilia: Secondary | ICD-10-CM | POA: Diagnosis not present

## 2022-03-14 DIAGNOSIS — I7121 Aneurysm of the ascending aorta, without rupture: Secondary | ICD-10-CM

## 2022-03-14 DIAGNOSIS — I10 Essential (primary) hypertension: Secondary | ICD-10-CM | POA: Diagnosis not present

## 2022-03-14 DIAGNOSIS — I5022 Chronic systolic (congestive) heart failure: Secondary | ICD-10-CM

## 2022-03-14 DIAGNOSIS — I480 Paroxysmal atrial fibrillation: Secondary | ICD-10-CM

## 2022-03-14 MED ORDER — APIXABAN 5 MG PO TABS: 5.0000 mg | ORAL_TABLET | Freq: Two times a day (BID) | ORAL | 11 refills | Status: DC

## 2022-03-14 MED ORDER — FUROSEMIDE 40 MG PO TABS: 40.0000 mg | ORAL_TABLET | Freq: Every day | ORAL | 5 refills | Status: DC

## 2022-03-14 NOTE — Patient Instructions (Signed)
Medication Instructions:  Your Physician recommend you continue on your current medication as directed.    We have sent refills today!   *If you need a refill on your cardiac medications before your next appointment, please call your pharmacy*   Lab Work: Please return for Lab work BMP/ CBC one week prior to cardioversion . You may come to the...   [facility] (3rd floor) [address], [place], [place] [address]  Open: 8am-Noon and 1pm-4:30pm  Please ring the doorbell on the small table when you exit the elevator and the Lab Tech will come get you  [place] [facility] [address] [place], [place], [place] [address] Open: 8am-1pm, then 2pm-4:30pm   [place]- Please see attached locations sheet stapled to your lab work with address and hours.   If you have labs (blood work) drawn today and your tests are completely normal, you will receive your results only by: [place] (if you have MyChart) OR A paper copy in the mail If you have any lab test that is abnormal or we need to change your treatment, we will call you to review the results.   Testing/Procedures: You are scheduled for a Cardioversion on Wednesday, March 6 with Dr. Johney Frame.  Please arrive at the [facility] (Main Entrance A) at [facility]: [address] [place], [place] [address] at 8:30 AM.   DIET:  Nothing to eat or drink after midnight except a sip of water with medications (see medication instructions below)  MEDICATION INSTRUCTIONS: Continue taking your anticoagulant (blood thinner): Apixaban (Eliquis).  You will need to continue this after your procedure until you are told by your provider that it is safe to stop.    LABS: One week prior to cardioversion   FYI:  For your safety, and to allow [place] to monitor your vital signs accurately during the surgery/procedure we request: If you have artificial nails, gel coating, SNS etc, please have those  removed prior to your surgery/procedure. Not having the nail coverings /polish removed may result in cancellation or delay of your surgery/procedure.  You must have a responsible person to drive you home and stay in the waiting area during your procedure. Failure to do so could result in cancellation.  Bring your insurance cards.  *Special Note: Every effort is made to have your procedure done on time. Occasionally there are emergencies that occur at the hospital that may cause delays. Please be patient if a delay does occur.   Follow-Up: At [facility], you and your health needs are our priority.  As part of our continuing mission to provide you with exceptional heart care, we have created designated Provider Care Teams.  These Care Teams include your primary Cardiologist (physician) and Advanced Practice Providers (APPs -  Physician Assistants and Nurse Practitioners) who all work together to provide you with the care you need, when you need it.  We recommend signing up for the patient portal called "MyChart".  Sign up information is provided on this After Visit Summary.  MyChart is used to connect with patients for Virtual Visits (Telemedicine).  Patients are able to view lab/test results, encounter notes, upcoming appointments, etc.  Non-urgent messages can be sent to your provider as well.   To learn more about what you can do with MyChart, go to [email or web address].    Your next appointment:   2 week(s)  Provider:   Dr. Gwenlyn Found, [facility] APP or Hoy Morn  Other Instructions Recommend weighing daily and keeping a log. Please call our office if you have weight gain of 2 pounds overnight or 5 pounds in 1 week.   To prevent or reduce lower extremity swelling: Eat a low salt diet. Salt makes the body hold onto extra fluid which causes swelling. Sit with legs elevated. For example, in the recliner or on an [place].  Wear knee-high compression stockings during the daytime.  Ones labeled 15-20 mmHg provide good compression.

## 2022-03-14 NOTE — H&P (View-Only) (Signed)
Office Visit    Patient Name: Frank Carpenter Date of Encounter: 03/14/2022  PCP:  Marda Stalker, [place]  Cardiologist:  Quay Burow, MD  Advanced Practice Provider:  No care team member to display Electrophysiologist:  None      Chief Complaint    Frank Carpenter is a 72 y.o. male presents today for atrial fibrillation   Past Medical History    Past Medical History:  Diagnosis Date   Atrial fibrillation, chronic (HCC)    Chronic systolic (congestive) heart failure (HCC)    High cholesterol    OSA (obstructive sleep apnea)    Testosterone deficiency    Past Surgical History:  Procedure Laterality Date   CARDIOVERSION N/A 02/24/2022   Procedure: CARDIOVERSION;  Surgeon: Josue Hector, MD;  Location: [place];  Service: Cardiovascular;  Laterality: N/A;   NASAL SEPTUM SURGERY  04/2021    Allergies  Allergies  Allergen Reactions   Ezetimibe     Other Reaction(s): Sick feeling   Statins     Other reaction(s): Other (See Comments) Fatigue    History of Present Illness    Frank Carpenter is a 72 y.o. male with a hx of atrial fibrillation, systolic heart failure, hypertension, hyperlipidemia with statin intolerance, thoracic aortic aneurysm, prior tobacco use, OSA last seen 03/02/22 by Frank Memos, NP  Prior coronary calcium score 06/2019 of 0.  Echo 06-2021 normal LVEF, grade 1 diastolic dysfunction, small ascending thoracic aortic aneurysm 42 mm.  Last seen 06/30/2021 doing overall well from a cardiac perspective with no changes at that time.  He contacted the office 01/14/2022 noting that he had been diagnosed with atrial fibrillation and urgent care recommended present to the ED but did not go due to wait times.  He was encouraged to represent to the ED due to risk for stroke. Admitted 12/22 - 01/17/2022 and he was started on metoprolol and Eliquis.  Echo with LVEF reduced 40-45%, with aortic valve sclerosis but no  stenosis.  CT chest 12/2020 with aneurysmal dilation ascending aorta 4.5 cm recommended for semiannual imaging.  At follow-up 01/19/22 he was rate controlled on metoprolol tartrate continue 100 mg twice daily.  He was set up for cardioversion.  Due to ascending aortic aneurysm/2023 4.5 cm he is set up for repeat CT aorta in 6 months.  Admitted 1/29 - 02/25/2022 with acute systolic heart failure treated with IV Lasix.  He underwent cardioversion 02/24/2022.  Saw Frank Memos, NP in follow-up 03/02/2022 maintaining sinus rhythm.  GDMT furosemide, Entresto, Farxiga, Aldactone, metoprolol were continued-blood pressure did not allow to uptitrate.  He contacted the office 03/11/2022 after his Apple Watch had alerted him for atrial fibrillation for 3 days.  He presents today for follow up independently. Usually exercises at the [facility]. Notes he inadvertently had a Red Bull as did not realize what the can was and attributes this to his atrial fibrillation. Otherwise has been avoiding caffeine, drinking decaf coffee. Feels he drags on Metoprolol and wonders whether he could use a reduced dose. Notes this time does not feel as poorly in atrial fibrillation. Has not had recurrent weight gain, lower extremity edema. No chest pain, dyspnea.   EKGs/Labs/Other Studies Reviewed:   The following studies were reviewed today:  2D echocardiogram (01/15/2022) IMPRESSIONS   1. Left ventricular ejection fraction, by estimation, is 40 to 45%. The  left ventricle has mildly decreased function. The left ventricle  demonstrates global  hypokinesis. The left ventricular internal cavity size  was mildly dilated. Left ventricular  diastolic parameters are indeterminate.   2. Right ventricular systolic function is mildly reduced. The right  ventricular size is mildly enlarged. There is normal pulmonary artery  systolic pressure.   3. Left atrial size was moderately dilated.   4. Right atrial size was moderately dilated.   5. The  mitral valve is abnormal. Trivial mitral valve regurgitation. No  evidence of mitral stenosis.   6. The aortic valve is tricuspid. There is moderate calcification of the  aortic valve. There is moderate thickening of the aortic valve. Aortic  valve regurgitation is not visualized. Aortic valve sclerosis is present,  with no evidence of aortic valve  stenosis.   7. The inferior vena cava is normal in size with greater than 50%  respiratory variability, suggesting right atrial pressure of 3 mmHg.   EKG:  EKG is ordered today.  The ekg ordered today demonstrates rate controlled atrial fibrillation 88 bpm.   Recent Labs: 01/15/2022: TSH 1.219 01/17/2022: Magnesium 1.9 02/21/2022: ALT 43; B Natriuretic Peptide 605.4 02/23/2022: Hemoglobin 18.1; Platelets 271 02/25/2022: BUN 18; Creatinine, Ser 1.05; Potassium 3.9; Sodium 135  Recent Lipid Panel    Component Value Date/Time   CHOL 151 01/15/2022 1847   TRIG 81 01/15/2022 1847   HDL 44 01/15/2022 1847   CHOLHDL 3.4 01/15/2022 1847   VLDL 16 01/15/2022 1847   LDLCALC 91 01/15/2022 1847   Risk Assessment/Calculations:   CHA2DS2-VASc Score = 4   This indicates a 4.8% annual risk of stroke. The patient's score is based upon: CHF History: 1 HTN History: 1 Diabetes History: 0 Stroke History: 0 Vascular Disease History: 1 (12/2021 coronary calcification on CT) Age Score: 1 Gender Score: 0     Home Medications   Current Meds  Medication Sig   apixaban (ELIQUIS) 5 MG TABS tablet Take 1 tablet (5 mg total) by mouth 2 (two) times daily for 360 doses.   [facility] 12 HOUR NASAL DECONGESTANT 120 MG 12 hr tablet Take 120 mg by mouth daily as needed for congestion.   dapagliflozin propanediol (FARXIGA) 10 MG TABS tablet Take 1 tablet (10 mg total) by mouth daily.   docusate sodium (COLACE) 100 MG capsule Take 400 mg by mouth 2 (two) times daily.   fluticasone (FLONASE) 50 MCG/ACT nasal spray Place 2 sprays into both nostrils daily as needed for  allergies.   furosemide (LASIX) 40 MG tablet Take 1 tablet (40 mg total) by mouth daily.   hydrOXYzine (ATARAX) 25 MG tablet Take 25 mg by mouth every 8 (eight) hours as needed for anxiety.   methadone (DOLOPHINE) 10 MG/ML solution Take 135 mg by mouth daily.   metoprolol tartrate (LOPRESSOR) 100 MG tablet Take 1 tablet (100 mg total) by mouth 2 (two) times daily.   mirtazapine (REMERON) 15 MG tablet Take 15 mg by mouth at bedtime.   Multiple Vitamin (MULTIVITAMIN) capsule Take 1 capsule by mouth daily.   psyllium (METAMUCIL SMOOTH TEXTURE) 28 % packet Take 1 packet by mouth daily as needed (constipation).   sacubitril-valsartan (ENTRESTO) 24-26 MG Take 1 tablet by mouth 2 (two) times daily.   spironolactone (ALDACTONE) 25 MG tablet Take 0.5 tablets (12.5 mg total) by mouth daily.   testosterone cypionate (DEPOTESTOSTERONE CYPIONATE) 200 MG/ML injection Inject 100 mg into the muscle once a week. Thursday     Review of Systems      All other systems reviewed and are otherwise negative except  as noted above.  Physical Exam    VS:  BP 100/68   Pulse 88   Ht 6' (1.829 m)   Wt 222 lb (100.7 kg)   BMI 30.11 kg/m  , BMI Body mass index is 30.11 kg/m.  Wt Readings from Last 3 Encounters:  03/14/22 222 lb (100.7 kg)  03/02/22 221 lb (100.2 kg)  02/24/22 224 lb 13.9 oz (102 kg)    GEN: Well nourished, well developed, in no acute distress. HEENT: normal. Neck: Supple, no JVD, carotid bruits, or masses. Cardiac: IRIR, no murmurs, rubs, or gallops. No clubbing, cyanosis, edema.  Radials/PT 2+ and equal bilaterally.  Respiratory:  Respirations regular and unlabored, clear to auscultation bilaterally. GI: Soft, nontender, nondistended. MS: No deformity or atrophy. Skin: Warm and dry, no rash. Neuro:  Strength and sensation are intact. Psych: Normal affect.  Assessment & Plan    Atrial fibrillation/hypercoagulable state - s/p DCCV  02/24/22. Now recurrent atrial fibrillation after Red  Bull. Rate controlled in clinic today. Continue Metoprolol Tartrate '100mg'$  BID. He notes fatigue while on this medication and is agreeable to re-discuss dosing/alternatives after NSR has been restored.  CHA2DS2-VASc Score = 4 [CHF History: 1, HTN History: 1, Diabetes History: 0, Stroke History: 0, Vascular Disease History: 1 (12/2021 coronary calcification on CT), Age Score: 1, Gender Score: 0].  Therefore, the patient's annual risk of stroke is 4.8 %.  Continue Eliquis 5 mg twice daily.  Does not meet dose reduction criteria. Has not missed any doses Plan for cardioversion 03/30/22 with BMP/CBC one week prior.  If recurrent atrial fibrillation again consider referral to EP to discuss AAD. Defer today as present episode triggered by caffeine (Red Bull)  Shared Decision Making/Informed Consent The risks (stroke, cardiac arrhythmias rarely resulting in the need for a temporary or permanent pacemaker, skin irritation or burns and complications associated with conscious sedation including aspiration, arrhythmia, respiratory failure and death), benefits (restoration of normal sinus rhythm) and alternatives of a direct current cardioversion were explained in detail to Frank Carpenter and he agrees to proceed.      Ascending aortic aneurysm - 07/2020 4.2 cm. 12/2021 4.5cm recommended for semi annual imaging. Plan for CT aorta in 6 months already ordered.  Continue optimal blood pressure control. Avoid fluoroquinolone.  HTN - BP well controlled. Continue current antihypertensive regimen.    OSA - CPAP compliance encouraged.   HLD -Statin intolerant.  01/15/2022 total cholesterol 151 chondrocytes 81, HDL 44, LDL 91.  Continue Zetia.  Systolic heart [id number] LVEF 60 to 65%.  Updated echo 12/2021 LVEF 40 to 45% like related to atrial fibrillation. Has repeat echocardiogram 04/18/22. He is euvolemic on exam and will continue Lasix '40mg'$  PRN for edema or weight gain 2 lbs overnight or 5 lbs in one week. GDMT  Lisabeth Register, Aldactone, Lasix, Metoprolol.        Disposition: Follow up 2 weeks after cardioversion  Signed, Loel Dubonnet, NP 03/14/2022, 9:20 AM [place]

## 2022-03-14 NOTE — Progress Notes (Signed)
Office Visit    Patient Name: Frank Carpenter Date of Encounter: 03/14/2022  PCP:  Marda Stalker, [place]  Cardiologist:  Quay Burow, MD  Advanced Practice Provider:  No care team member to display Electrophysiologist:  None      Chief Complaint    Frank Carpenter is a 72 y.o. male presents today for atrial fibrillation   Past Medical History    Past Medical History:  Diagnosis Date   Atrial fibrillation, chronic (HCC)    Chronic systolic (congestive) heart failure (HCC)    High cholesterol    OSA (obstructive sleep apnea)    Testosterone deficiency    Past Surgical History:  Procedure Laterality Date   CARDIOVERSION N/A 02/24/2022   Procedure: CARDIOVERSION;  Surgeon: Josue Hector, MD;  Location: [place];  Service: Cardiovascular;  Laterality: N/A;   NASAL SEPTUM SURGERY  04/2021    Allergies  Allergies  Allergen Reactions   Ezetimibe     Other Reaction(s): Sick feeling   Statins     Other reaction(s): Other (See Comments) Fatigue    History of Present Illness    Frank Carpenter is a 72 y.o. male with a hx of atrial fibrillation, systolic heart failure, hypertension, hyperlipidemia with statin intolerance, thoracic aortic aneurysm, prior tobacco use, OSA last seen 03/02/22 by Coletta Memos, NP  Prior coronary calcium score 06/2019 of 0.  Echo 06-2021 normal LVEF, grade 1 diastolic dysfunction, small ascending thoracic aortic aneurysm 42 mm.  Last seen 06/30/2021 doing overall well from a cardiac perspective with no changes at that time.  He contacted the office 01/14/2022 noting that he had been diagnosed with atrial fibrillation and urgent care recommended present to the ED but did not go due to wait times.  He was encouraged to represent to the ED due to risk for stroke. Admitted 12/22 - 01/17/2022 and he was started on metoprolol and Eliquis.  Echo with LVEF reduced 40-45%, with aortic valve sclerosis but no  stenosis.  CT chest 12/2020 with aneurysmal dilation ascending aorta 4.5 cm recommended for semiannual imaging.  At follow-up 01/19/22 he was rate controlled on metoprolol tartrate continue 100 mg twice daily.  He was set up for cardioversion.  Due to ascending aortic aneurysm/2023 4.5 cm he is set up for repeat CT aorta in 6 months.  Admitted 1/29 - 02/25/2022 with acute systolic heart failure treated with IV Lasix.  He underwent cardioversion 02/24/2022.  Saw Coletta Memos, NP in follow-up 03/02/2022 maintaining sinus rhythm.  GDMT furosemide, Entresto, Farxiga, Aldactone, metoprolol were continued-blood pressure did not allow to uptitrate.  He contacted the office 03/11/2022 after his Apple Watch had alerted him for atrial fibrillation for 3 days.  He presents today for follow up independently. Usually exercises at the [facility]. Notes he inadvertently had a Red Bull as did not realize what the can was and attributes this to his atrial fibrillation. Otherwise has been avoiding caffeine, drinking decaf coffee. Feels he drags on Metoprolol and wonders whether he could use a reduced dose. Notes this time does not feel as poorly in atrial fibrillation. Has not had recurrent weight gain, lower extremity edema. No chest pain, dyspnea.   EKGs/Labs/Other Studies Reviewed:   The following studies were reviewed today:  2D echocardiogram (01/15/2022) IMPRESSIONS   1. Left ventricular ejection fraction, by estimation, is 40 to 45%. The  left ventricle has mildly decreased function. The left ventricle  demonstrates global  hypokinesis. The left ventricular internal cavity size  was mildly dilated. Left ventricular  diastolic parameters are indeterminate.   2. Right ventricular systolic function is mildly reduced. The right  ventricular size is mildly enlarged. There is normal pulmonary artery  systolic pressure.   3. Left atrial size was moderately dilated.   4. Right atrial size was moderately dilated.   5. The  mitral valve is abnormal. Trivial mitral valve regurgitation. No  evidence of mitral stenosis.   6. The aortic valve is tricuspid. There is moderate calcification of the  aortic valve. There is moderate thickening of the aortic valve. Aortic  valve regurgitation is not visualized. Aortic valve sclerosis is present,  with no evidence of aortic valve  stenosis.   7. The inferior vena cava is normal in size with greater than 50%  respiratory variability, suggesting right atrial pressure of 3 mmHg.   EKG:  EKG is ordered today.  The ekg ordered today demonstrates rate controlled atrial fibrillation 88 bpm.   Recent Labs: 01/15/2022: TSH 1.219 01/17/2022: Magnesium 1.9 02/21/2022: ALT 43; B Natriuretic Peptide 605.4 02/23/2022: Hemoglobin 18.1; Platelets 271 02/25/2022: BUN 18; Creatinine, Ser 1.05; Potassium 3.9; Sodium 135  Recent Lipid Panel    Component Value Date/Time   CHOL 151 01/15/2022 1847   TRIG 81 01/15/2022 1847   HDL 44 01/15/2022 1847   CHOLHDL 3.4 01/15/2022 1847   VLDL 16 01/15/2022 1847   LDLCALC 91 01/15/2022 1847   Risk Assessment/Calculations:   CHA2DS2-VASc Score = 4   This indicates a 4.8% annual risk of stroke. The patient's score is based upon: CHF History: 1 HTN History: 1 Diabetes History: 0 Stroke History: 0 Vascular Disease History: 1 (12/2021 coronary calcification on CT) Age Score: 1 Gender Score: 0     Home Medications   Current Meds  Medication Sig   apixaban (ELIQUIS) 5 MG TABS tablet Take 1 tablet (5 mg total) by mouth 2 (two) times daily for 360 doses.   [facility]S 12 HOUR NASAL DECONGESTANT 120 MG 12 hr tablet Take 120 mg by mouth daily as needed for congestion.   dapagliflozin propanediol (FARXIGA) 10 MG TABS tablet Take 1 tablet (10 mg total) by mouth daily.   docusate sodium (COLACE) 100 MG capsule Take 400 mg by mouth 2 (two) times daily.   fluticasone (FLONASE) 50 MCG/ACT nasal spray Place 2 sprays into both nostrils daily as needed for  allergies.   furosemide (LASIX) 40 MG tablet Take 1 tablet (40 mg total) by mouth daily.   hydrOXYzine (ATARAX) 25 MG tablet Take 25 mg by mouth every 8 (eight) hours as needed for anxiety.   methadone (DOLOPHINE) 10 MG/ML solution Take 135 mg by mouth daily.   metoprolol tartrate (LOPRESSOR) 100 MG tablet Take 1 tablet (100 mg total) by mouth 2 (two) times daily.   mirtazapine (REMERON) 15 MG tablet Take 15 mg by mouth at bedtime.   Multiple Vitamin (MULTIVITAMIN) capsule Take 1 capsule by mouth daily.   psyllium (METAMUCIL SMOOTH TEXTURE) 28 % packet Take 1 packet by mouth daily as needed (constipation).   sacubitril-valsartan (ENTRESTO) 24-26 MG Take 1 tablet by mouth 2 (two) times daily.   spironolactone (ALDACTONE) 25 MG tablet Take 0.5 tablets (12.5 mg total) by mouth daily.   testosterone cypionate (DEPOTESTOSTERONE CYPIONATE) 200 MG/ML injection Inject 100 mg into the muscle once a week. Thursday     Review of Systems      All other systems reviewed and are otherwise negative except  as noted above.  Physical Exam    VS:  BP 100/68   Pulse 88   Ht 6' (1.829 m)   Wt 222 lb (100.7 kg)   BMI 30.11 kg/m  , BMI Body mass index is 30.11 kg/m.  Wt Readings from Last 3 Encounters:  03/14/22 222 lb (100.7 kg)  03/02/22 221 lb (100.2 kg)  02/24/22 224 lb 13.9 oz (102 kg)    GEN: Well nourished, well developed, in no acute distress. HEENT: normal. Neck: Supple, no JVD, carotid bruits, or masses. Cardiac: IRIR, no murmurs, rubs, or gallops. No clubbing, cyanosis, edema.  Radials/PT 2+ and equal bilaterally.  Respiratory:  Respirations regular and unlabored, clear to auscultation bilaterally. GI: Soft, nontender, nondistended. MS: No deformity or atrophy. Skin: Warm and dry, no rash. Neuro:  Strength and sensation are intact. Psych: Normal affect.  Assessment & Plan    Atrial fibrillation/hypercoagulable state - s/p DCCV  02/24/22. Now recurrent atrial fibrillation after Red  Bull. Rate controlled in clinic today. Continue Metoprolol Tartrate 161m BID. He notes fatigue while on this medication and is agreeable to re-discuss dosing/alternatives after NSR has been restored.  CHA2DS2-VASc Score = 4 [CHF History: 1, HTN History: 1, Diabetes History: 0, Stroke History: 0, Vascular Disease History: 1 (12/2021 coronary calcification on CT), Age Score: 1, Gender Score: 0].  Therefore, the patient's annual risk of stroke is 4.8 %.  Continue Eliquis 5 mg twice daily.  Does not meet dose reduction criteria. Has not missed any doses Plan for cardioversion 03/30/22 with BMP/CBC one week prior.  If recurrent atrial fibrillation again consider referral to EP to discuss AAD. Defer today as present episode triggered by caffeine (Red Bull)  Shared Decision Making/Informed Consent The risks (stroke, cardiac arrhythmias rarely resulting in the need for a temporary or permanent pacemaker, skin irritation or burns and complications associated with conscious sedation including aspiration, arrhythmia, respiratory failure and death), benefits (restoration of normal sinus rhythm) and alternatives of a direct current cardioversion were explained in detail to Frank Carpenter and he agrees to proceed.      Ascending aortic aneurysm - 07/2020 4.2 cm. 12/2021 4.5cm recommended for semi annual imaging. Plan for CT aorta in 6 months already ordered.  Continue optimal blood pressure control. Avoid fluoroquinolone.  HTN - BP well controlled. Continue current antihypertensive regimen.    OSA - CPAP compliance encouraged.   HLD -Statin intolerant.  01/15/2022 total cholesterol 151 chondrocytes 81, HDL 44, LDL 91.  Continue Zetia.  Systolic heart [id number] 60 to 65%.  Updated echo 12/2021 LVEF 40 to 45% like related to atrial fibrillation. Has repeat echocardiogram 04/18/22. He is euvolemic on exam and will continue Lasix 435mPRN for edema or weight gain 2 lbs overnight or 5 lbs in one week. GDMT  FaLisabeth RegisterAldactone, Lasix, Metoprolol.        Disposition: Follow up 2 weeks after cardioversion  Signed, CaLoel DubonnetNP 03/14/2022, 9:20 AM [place]

## 2022-03-18 ENCOUNTER — Telehealth: Payer: Self-pay | Admitting: Cardiovascular Disease

## 2022-03-18 MED ORDER — SPIRONOLACTONE 25 MG PO TABS: 12.5000 mg | ORAL_TABLET | Freq: Every day | ORAL | 3 refills | Status: DC

## 2022-03-18 MED ORDER — DAPAGLIFLOZIN PROPANEDIOL 10 MG PO TABS: 10.0000 mg | ORAL_TABLET | Freq: Every day | ORAL | 3 refills | Status: DC

## 2022-03-18 MED ORDER — SACUBITRIL-VALSARTAN 24-26 MG PO TABS: 1.0000 | ORAL_TABLET | Freq: Two times a day (BID) | ORAL | 3 refills | Status: DC

## 2022-03-18 NOTE — Telephone Encounter (Signed)
Spoke with patient via phone and patient stated that he only need his Entresto, Spironolactone and Wilder Glade because his Metoprolol was refilled at his last office visit with the PA. Patient [facility] [address] so he didn't have to wait for it in the mail. Patient was very satisfied that I understood his wants and needs. Medication sent to desired pharmacy.

## 2022-03-18 NOTE — Telephone Encounter (Signed)
*  STAT* If patient is at the pharmacy, call can be transferred to refill team.   1. Which medications need to be refilled? (please list name of each medication and dose if known)  sacubitril-valsartan (ENTRESTO) 24-26 MG  spironolactone (ALDACTONE) 25 MG tablet  metoprolol tartrate (LOPRESSOR) 100 MG tablet  dapagliflozin propanediol (FARXIGA) 10 MG TABS tablet   furosemide (LASIX) 40 MG tablet    2. Which pharmacy/location (including street and city if local pharmacy) is medication to be sent to? [facility]  3. Do they need a 30 day or 90 day supply? 90 day

## 2022-03-24 DIAGNOSIS — D6859 Other primary thrombophilia: Secondary | ICD-10-CM | POA: Diagnosis not present

## 2022-03-24 DIAGNOSIS — I480 Paroxysmal atrial fibrillation: Secondary | ICD-10-CM | POA: Diagnosis not present

## 2022-03-24 DIAGNOSIS — I7121 Aneurysm of the ascending aorta, without rupture: Secondary | ICD-10-CM | POA: Diagnosis not present

## 2022-03-24 DIAGNOSIS — I5022 Chronic systolic (congestive) heart failure: Secondary | ICD-10-CM | POA: Diagnosis not present

## 2022-03-24 DIAGNOSIS — I10 Essential (primary) hypertension: Secondary | ICD-10-CM | POA: Diagnosis not present

## 2022-03-25 LAB — CBC
Hematocrit: 56.9 % — ABNORMAL HIGH (ref 37.5–51.0)
Hemoglobin: 19.3 g/dL — ABNORMAL HIGH (ref 13.0–17.7)
MCH: 33.1 pg — ABNORMAL HIGH (ref 26.6–33.0)
MCHC: 33.9 g/dL (ref 31.5–35.7)
MCV: 98 fL — ABNORMAL HIGH (ref 79–97)
Platelets: 236 10*3/uL (ref 150–450)
RBC: 5.83 x10E6/uL — ABNORMAL HIGH (ref 4.14–5.80)
RDW: 12.1 % (ref 11.6–15.4)
WBC: 9.5 10*3/uL (ref 3.4–10.8)

## 2022-03-25 LAB — BASIC METABOLIC PANEL
BUN/Creatinine Ratio: 27 — ABNORMAL HIGH (ref 10–24)
BUN: 28 mg/dL — ABNORMAL HIGH (ref 8–27)
CO2: 25 mmol/L (ref 20–29)
Calcium: 9.7 mg/dL (ref 8.6–10.2)
Chloride: 98 mmol/L (ref 96–106)
Creatinine, Ser: 1.05 mg/dL (ref 0.76–1.27)
Glucose: 100 mg/dL — ABNORMAL HIGH (ref 70–99)
Potassium: 4.4 mmol/L (ref 3.5–5.2)
Sodium: 141 mmol/L (ref 134–144)
eGFR: 75 mL/min/{1.73_m2} (ref >59)

## 2022-03-30 ENCOUNTER — Ambulatory Visit (HOSPITAL_COMMUNITY)
Admission: RE | Admit: 2022-03-30 | Discharge: 2022-03-30 | Disposition: A | Discharge status: Home / Self Care | Payer: Medicare Other | Source: Ambulatory Visit | Attending: Cardiology | Admitting: Cardiology

## 2022-03-30 ENCOUNTER — Encounter (HOSPITAL_COMMUNITY)
Admission: RE | Disposition: A | Discharge status: Home / Self Care | Payer: Self-pay | Source: Ambulatory Visit | Attending: Cardiology

## 2022-03-30 ENCOUNTER — Other Ambulatory Visit: Payer: Self-pay

## 2022-03-30 ENCOUNTER — Encounter (HOSPITAL_COMMUNITY): Payer: Self-pay | Admitting: Cardiology

## 2022-03-30 ENCOUNTER — Ambulatory Visit (HOSPITAL_BASED_OUTPATIENT_CLINIC_OR_DEPARTMENT_OTHER): Payer: Medicare Other | Admitting: Certified Registered Nurse Anesthetist

## 2022-03-30 ENCOUNTER — Ambulatory Visit (HOSPITAL_COMMUNITY): Payer: Medicare Other | Admitting: Certified Registered Nurse Anesthetist

## 2022-03-30 DIAGNOSIS — E782 Mixed hyperlipidemia: Secondary | ICD-10-CM | POA: Diagnosis not present

## 2022-03-30 DIAGNOSIS — G4733 Obstructive sleep apnea (adult) (pediatric): Secondary | ICD-10-CM | POA: Diagnosis not present

## 2022-03-30 DIAGNOSIS — I4891 Unspecified atrial fibrillation: Secondary | ICD-10-CM | POA: Diagnosis not present

## 2022-03-30 DIAGNOSIS — Z87891 Personal history of nicotine dependence: Secondary | ICD-10-CM | POA: Insufficient documentation

## 2022-03-30 DIAGNOSIS — I110 Hypertensive heart disease with heart failure: Secondary | ICD-10-CM | POA: Insufficient documentation

## 2022-03-30 DIAGNOSIS — I5022 Chronic systolic (congestive) heart failure: Secondary | ICD-10-CM | POA: Insufficient documentation

## 2022-03-30 DIAGNOSIS — I4819 Other persistent atrial fibrillation: Secondary | ICD-10-CM | POA: Insufficient documentation

## 2022-03-30 DIAGNOSIS — I480 Paroxysmal atrial fibrillation: Secondary | ICD-10-CM | POA: Diagnosis not present

## 2022-03-30 DIAGNOSIS — I509 Heart failure, unspecified: Secondary | ICD-10-CM | POA: Diagnosis not present

## 2022-03-30 DIAGNOSIS — G4730 Sleep apnea, unspecified: Secondary | ICD-10-CM | POA: Insufficient documentation

## 2022-03-30 DIAGNOSIS — I358 Other nonrheumatic aortic valve disorders: Secondary | ICD-10-CM | POA: Diagnosis not present

## 2022-03-30 HISTORY — PX: CARDIOVERSION: SHX1299

## 2022-03-30 SURGERY — CARDIOVERSION
Anesthesia: General

## 2022-03-30 MED ORDER — AMIODARONE HCL 200 MG PO TABS: ORAL_TABLET | ORAL | 0 refills | Status: DC

## 2022-03-30 MED ORDER — SODIUM CHLORIDE 0.9 % IV SOLN: INTRAVENOUS | Status: DC

## 2022-03-30 MED ORDER — PROPOFOL 10 MG/ML IV BOLUS
INTRAVENOUS | Status: DC | PRN
  Administered 2022-03-30: 60 mg via INTRAVENOUS
  Administered 2022-03-30: 10 mg via INTRAVENOUS
  Administered 2022-03-30: 30 mg via INTRAVENOUS

## 2022-03-30 MED ORDER — LIDOCAINE 2% (20 MG/ML) 5 ML SYRINGE
INTRAMUSCULAR | Status: DC | PRN
  Administered 2022-03-30: 40 mg via INTRAVENOUS

## 2022-03-30 NOTE — Discharge Instructions (Signed)

## 2022-03-30 NOTE — Anesthesia Procedure Notes (Signed)
Procedure Name: General with mask airway Date/Time: 03/30/2022 9:27 AM  Performed by: Carolan Clines, CRNAPre-anesthesia Checklist: Patient identified, Emergency Drugs available, Suction available and Patient being monitored Patient Re-evaluated:Patient Re-evaluated prior to induction Oxygen Delivery Method: Ambu bag Preoxygenation: Pre-oxygenation with 100% oxygen Induction Type: IV induction Dental Injury: Teeth and Oropharynx as per pre-operative assessment

## 2022-03-30 NOTE — Anesthesia Postprocedure Evaluation (Signed)
Anesthesia Post Note  Patient: Frank Carpenter  Procedure(s) Performed: CARDIOVERSION     Patient location during evaluation: PACU Anesthesia Type: General Level of consciousness: awake and alert Pain management: pain level controlled Vital Signs Assessment: post-procedure vital signs reviewed and stable Respiratory status: spontaneous breathing, nonlabored ventilation, respiratory function stable and patient connected to nasal cannula oxygen Cardiovascular status: blood pressure returned to baseline and stable Postop Assessment: no apparent nausea or vomiting Anesthetic complications: no  No notable events documented.  Last Vitals:  Vitals:   03/30/22 0931 03/30/22 0940  BP: 107/85 104/79  Pulse: 82   Resp: 14   Temp: 36.6 C   SpO2: 93% 92%    Last Pain:  Vitals:   03/30/22 0950  TempSrc:   PainSc: 0-No pain                 Effie Berkshire

## 2022-03-30 NOTE — CV Procedure (Signed)
Procedure: Electrical Cardioversion Indications:  Atrial Fibrillation  Procedure Details:  Consent: Risks of procedure as well as the alternatives and risks of each were explained to the (patient/caregiver).  Consent for procedure obtained.  Time Out: Verified patient identification, verified procedure, site/side was marked, verified correct patient position, special equipment/implants available, medications/allergies/relevent history reviewed, required imaging and test results available. PERFORMED.  Patient placed on cardiac monitor, pulse oximetry, supplemental oxygen as necessary.  Sedation given:  Propofol '100mg'$ ; lidocaine '40mg'$  Pacer pads placed anterior and posterior chest.  Cardioverted 3 time(s).  Cardioversion with synchronized biphasic 200J shock.  Evaluation: Findings: Post procedure EKG shows: Atrial Fibrillation Complications: Persistent Afib Patient did tolerate procedure well.  Time Spent Directly with the Patient:  83mnutes   HFreada Bergeron3/06/2022, 9:32 AM

## 2022-03-30 NOTE — Interval H&P Note (Signed)
History and Physical Interval Note:  03/30/2022 9:20 AM  Frank Carpenter  has presented today for surgery, with the diagnosis of AFIB.  The various methods of treatment have been discussed with the patient and family. After consideration of risks, benefits and other options for treatment, the patient has consented to  Procedure(s): CARDIOVERSION (N/A) as a surgical intervention.  The patient's history has been reviewed, patient examined, no change in status, stable for surgery.  I have reviewed the patient's chart and labs.  Questions were answered to the patient's satisfaction.     Freada Bergeron

## 2022-03-30 NOTE — Anesthesia Preprocedure Evaluation (Addendum)
Anesthesia Evaluation  Patient identified by MRN, date of birth, ID band Patient awake    Reviewed: Allergy & Precautions, NPO status , Patient's Chart, lab work & pertinent test results  Airway Mallampati: I  TM Distance: >3 FB Neck ROM: Full    Dental  (+) Teeth Intact, Dental Advisory Given   Pulmonary sleep apnea , former smoker   breath sounds clear to auscultation       Cardiovascular hypertension, Pt. on medications and Pt. on home beta blockers +CHF  + dysrhythmias Atrial Fibrillation  Rhythm:Irregular Rate:Abnormal  12/2021 ECHO: EF 40-45%. The LV has mildly decreased function, global hypokinesis. The left ventricular internal cavity size was mildly dilated. Left ventricular  diastolic parameters are indeterminate. RVF mildly reduced, trivial MR, aortic valve sclerosis without stenosis    Neuro/Psych    GI/Hepatic ,,,(+)     substance abuse (methadone)    Endo/Other    Renal/GU      Musculoskeletal   Abdominal   Peds  Hematology eliquis   Anesthesia Other Findings   Reproductive/Obstetrics                             Anesthesia Physical Anesthesia Plan  ASA: 3  Anesthesia Plan: General   Post-op Pain Management: Minimal or no pain anticipated   Induction:   PONV Risk Score and Plan: 1 and Treatment may vary due to age or medical condition  Airway Management Planned: Natural Airway and Mask  Additional Equipment: None  Intra-op Plan:   Post-operative Plan:   Informed Consent: I have reviewed the patients History and Physical, chart, labs and discussed the procedure including the risks, benefits and alternatives for the proposed anesthesia with the patient or authorized representative who has indicated his/her understanding and acceptance.       Plan Discussed with: CRNA  Anesthesia Plan Comments:         Anesthesia Quick Evaluation

## 2022-03-30 NOTE — Transfer of Care (Signed)
Immediate Anesthesia Transfer of Care Note  Patient: Frank Carpenter  Procedure(s) Performed: CARDIOVERSION  Patient Location: Endoscopy Unit  Anesthesia Type:General  Level of Consciousness: drowsy  Airway & Oxygen Therapy: Patient Spontanous Breathing  Post-op Assessment: Report given to RN and Post -op Vital signs reviewed and stable  Post vital signs: Reviewed and stable  Last Vitals:  Vitals Value Taken Time  BP 107/85   Temp    Pulse 80   Resp 18   SpO2 93     Last Pain:  Vitals:   03/30/22 0900  PainSc: 0-No pain         Complications: No notable events documented.

## 2022-04-03 ENCOUNTER — Encounter (HOSPITAL_COMMUNITY): Payer: Self-pay | Admitting: Cardiology

## 2022-04-11 ENCOUNTER — Ambulatory Visit (HOSPITAL_BASED_OUTPATIENT_CLINIC_OR_DEPARTMENT_OTHER): Payer: Medicare Other | Admitting: Family

## 2022-04-13 ENCOUNTER — Other Ambulatory Visit (HOSPITAL_BASED_OUTPATIENT_CLINIC_OR_DEPARTMENT_OTHER): Payer: Self-pay | Admitting: Family

## 2022-04-13 DIAGNOSIS — I5022 Chronic systolic (congestive) heart failure: Secondary | ICD-10-CM

## 2022-04-13 DIAGNOSIS — D6859 Other primary thrombophilia: Secondary | ICD-10-CM

## 2022-04-13 DIAGNOSIS — I10 Essential (primary) hypertension: Secondary | ICD-10-CM

## 2022-04-13 DIAGNOSIS — I480 Paroxysmal atrial fibrillation: Secondary | ICD-10-CM

## 2022-04-13 NOTE — Telephone Encounter (Signed)
Patient of Dr. Berry. Please review for refill. Thank you!  

## 2022-04-14 ENCOUNTER — Other Ambulatory Visit: Payer: Self-pay

## 2022-04-14 ENCOUNTER — Ambulatory Visit (HOSPITAL_COMMUNITY)
Admission: RE | Admit: 2022-04-14 | Discharge: 2022-04-14 | Disposition: A | Discharge status: Home / Self Care | Payer: Medicare Other | Source: Ambulatory Visit | Attending: Physician Assistant | Admitting: Physician Assistant

## 2022-04-14 VITALS — BP 104/86 | HR 88 | Ht 71.0 in | Wt 229.0 lb

## 2022-04-14 DIAGNOSIS — G4733 Obstructive sleep apnea (adult) (pediatric): Secondary | ICD-10-CM | POA: Insufficient documentation

## 2022-04-14 DIAGNOSIS — E785 Hyperlipidemia, unspecified: Secondary | ICD-10-CM | POA: Diagnosis not present

## 2022-04-14 DIAGNOSIS — I480 Paroxysmal atrial fibrillation: Secondary | ICD-10-CM | POA: Diagnosis not present

## 2022-04-14 DIAGNOSIS — I5022 Chronic systolic (congestive) heart failure: Secondary | ICD-10-CM | POA: Insufficient documentation

## 2022-04-14 DIAGNOSIS — Z79899 Other long term (current) drug therapy: Secondary | ICD-10-CM | POA: Diagnosis not present

## 2022-04-14 DIAGNOSIS — Z7901 Long term (current) use of anticoagulants: Secondary | ICD-10-CM | POA: Insufficient documentation

## 2022-04-14 DIAGNOSIS — Z87891 Personal history of nicotine dependence: Secondary | ICD-10-CM | POA: Insufficient documentation

## 2022-04-14 DIAGNOSIS — D6869 Other thrombophilia: Secondary | ICD-10-CM

## 2022-04-14 DIAGNOSIS — I4819 Other persistent atrial fibrillation: Secondary | ICD-10-CM | POA: Diagnosis not present

## 2022-04-14 DIAGNOSIS — I110 Hypertensive heart disease with heart failure: Secondary | ICD-10-CM | POA: Diagnosis not present

## 2022-04-14 NOTE — Progress Notes (Signed)
Primary Care Physician: Marda Stalker, PA-C Primary Cardiologist: Dr Gwenlyn Found Primary Electrophysiologist: none Referring Physician: Laurann Montana NP   Frank Carpenter is a 72 y.o. male with a history of HLD, HTN, chronic systolic CHF, OSA, atrial fibrillation who presents for consultation in the [facility].  Patient was admitted 12/22 - 01/17/2022 and he was started on metoprolol and Eliquis for a CHADS2VASC score of 3.  Echo with LVEF reduced 40-45%, with aortic valve sclerosis but no stenosis.  CT chest 12/2020 with aneurysmal dilation ascending aorta 4.5 cm recommended for semiannual imaging.  At follow-up 01/19/22 he was rate controlled on metoprolol tartrate continue 100 mg twice daily.  He was set up for cardioversion but was admitted 1/29 - 02/25/2022 with acute systolic heart failure treated with IV Lasix.  He underwent cardioversion 02/24/2022.  Saw Coletta Memos, NP in follow-up 03/02/2022 maintaining sinus rhythm.     He contacted the office 03/11/2022 after his Apple Watch had alerted him for atrial fibrillation. He underwent repeat DCCV on 03/30/22 which was unsuccessful.   Today, patient remains in afib today with symptoms of fatigue on exertion. He also has been having joint and muscle pain in his thighs, hips, and shoulders, unclear etiology. No bleeding issues on anticoagulation.   Today, he denies symptoms of palpitations, chest pain, shortness of breath, orthopnea, PND, lower extremity edema, dizziness, presyncope, syncope, snoring, daytime somnolence, bleeding, or neurologic sequela. The patient is tolerating medications without difficulties and is otherwise without complaint today.    Atrial Fibrillation Risk Factors:  he does have symptoms or diagnosis of sleep apnea. he is compliant with CPAP therapy. he does not have a history of rheumatic fever.   he has a BMI of Body mass index is 31.94 kg/m.Marland Kitchen Filed Weights   04/14/22 1503  Weight: 103.9  kg    Family History  Problem Relation Age of Onset   Bladder Cancer Mother    Hypertension Father      Atrial Fibrillation Management history:  Previous antiarrhythmic drugs: none Previous cardioversions: 02/24/22, 03/30/22 unsuccessful  Previous ablations: none CHADS2VASC score: 3 Anticoagulation history: Eliquis   Past Medical History:  Diagnosis Date   Atrial fibrillation, chronic (HCC)    Chronic systolic (congestive) heart failure (HCC)    High cholesterol    OSA (obstructive sleep apnea)    Testosterone deficiency    Past Surgical History:  Procedure Laterality Date   CARDIOVERSION N/A 02/24/2022   Procedure: CARDIOVERSION;  Surgeon: Josue Hector, MD;  Location: [facility];  Service: Cardiovascular;  Laterality: N/A;   CARDIOVERSION N/A 03/30/2022   Procedure: CARDIOVERSION;  Surgeon: Freada Bergeron, MD;  Location: [facility] ENDOSCOPY;  Service: Cardiovascular;  Laterality: N/A;   NASAL SEPTUM SURGERY  04/2021    Current Outpatient Medications  Medication Sig Dispense Refill   apixaban (ELIQUIS) 5 MG TABS tablet Take 1 tablet (5 mg total) by mouth 2 (two) times daily for 360 doses. 30 tablet 11   dapagliflozin propanediol (FARXIGA) 10 MG TABS tablet Take 1 tablet (10 mg total) by mouth daily. 90 tablet 3   docusate sodium (COLACE) 100 MG capsule Take 400 mg by mouth 2 (two) times daily.     ezetimibe (ZETIA) 10 MG tablet Take 10 mg by mouth every morning.     furosemide (LASIX) 40 MG tablet Take 1 tablet (40 mg total) by mouth daily. 30 tablet 5   hydrOXYzine (ATARAX) 25 MG tablet Take 25 mg by mouth every  8 (eight) hours as needed for anxiety.     methadone (DOLOPHINE) 10 MG/ML solution Take 140 mg by mouth daily.     metoprolol tartrate (LOPRESSOR) 100 MG tablet Take 1 tablet (100 mg total) by mouth 2 (two) times daily. (Patient taking differently: Take 50 mg by mouth 2 (two) times daily.) 180 tablet 3   mirtazapine (REMERON) 15 MG tablet Take 15 mg by mouth at  bedtime.     Multiple Vitamin (MULTIVITAMIN) capsule Take 1 capsule by mouth daily.     sacubitril-valsartan (ENTRESTO) 24-26 MG Take 1 tablet by mouth 2 (two) times daily. 180 tablet 3   spironolactone (ALDACTONE) 25 MG tablet Take 0.5 tablets (12.5 mg total) by mouth daily. 45 tablet 3   testosterone cypionate (DEPOTESTOSTERONE CYPIONATE) 200 MG/ML injection Inject 100 mg into the muscle once a week. Thursday     No current facility-administered medications for this encounter.    Allergies  Allergen Reactions   Ezetimibe     Other Reaction(s): Sick feeling   Statins     Other reaction(s): Other (See Comments) Fatigue    Social History   Socioeconomic History   Marital status: Married    Spouse name: Not on file   Number of children: Not on file   Years of education: Not on file   Highest education level: Not on file  Occupational History   Occupation: retired  Tobacco Use   Smoking status: Former    Packs/day: 1.00    Years: 15.00    Additional pack years: 0.00    Total pack years: 15.00    Types: Cigarettes    Quit date: 2004    Years since quitting: 20.2   Smokeless tobacco: Never  Substance and Sexual Activity   Alcohol use: Yes    Alcohol/week: 2.0 standard drinks of alcohol    Types: 2 Cans of beer per week    Comment: rare   Drug use: Not Currently    Types: Marijuana    Comment: hydrocodone prescribed for pain   Sexual activity: Not on file  Other Topics Concern   Not on file  Social History Narrative   Not on file   Social Determinants of Health   Financial Resource Strain: Not on file  Food Insecurity: No Food Insecurity (02/22/2022)   Hunger Vital Sign    Worried About Running Out of Food in the Last Year: Never true    Ran Out of Food in the Last Year: Never true  Transportation Needs: No Transportation Needs (02/22/2022)   PRAPARE - Hydrologist (Medical): No    Lack of Transportation (Non-Medical): No  Physical  Activity: Not on file  Stress: Not on file  Social Connections: Not on file  Intimate Partner Violence: Not At Risk (02/22/2022)   Humiliation, Afraid, Rape, and Kick questionnaire    Fear of Current or Ex-Partner: No    Emotionally Abused: No    Physically Abused: No    Sexually Abused: No     ROS- All systems are reviewed and negative except as per the HPI above.  Physical Exam: Vitals:   04/14/22 1503  BP: 104/86  Pulse: 88  Weight: 103.9 kg  Height: 5\' 11"  (1.803 m)    GEN- The patient is a well appearing male, alert and oriented x 3 today.   Head- normocephalic, atraumatic Eyes-  Sclera clear, conjunctiva pink Ears- hearing intact Oropharynx- clear Neck- supple  Lungs- Clear to ausculation bilaterally, normal  work of breathing Heart- irregular rate and rhythm, no murmurs, rubs or gallops  GI- soft, NT, ND, + BS Extremities- no clubbing, cyanosis, or edema MS- no significant deformity or atrophy Skin- no rash or lesion Psych- euthymic mood, full affect Neuro- strength and sensation are intact  Wt Readings from Last 3 Encounters:  04/14/22 103.9 kg  03/30/22 99.8 kg  03/14/22 100.7 kg    EKG today demonstrates  Afib  Vent. rate 88 BPM PR interval * ms QRS duration 98 ms QT/QTcB 366/442 ms  Echo 01/15/22 demonstrated  1. Left ventricular ejection fraction, by estimation, is 40 to 45%. The  left ventricle has mildly decreased function. The left ventricle  demonstrates global hypokinesis. The left ventricular internal cavity size  was mildly dilated. Left ventricular diastolic parameters are indeterminate.   2. Right ventricular systolic function is mildly reduced. The right  ventricular size is mildly enlarged. There is normal pulmonary artery  systolic pressure.   3. Left atrial size was moderately dilated.   4. Right atrial size was moderately dilated.   5. The mitral valve is abnormal. Trivial mitral valve regurgitation. No  evidence of mitral  stenosis.   6. The aortic valve is tricuspid. There is moderate calcification of the  aortic valve. There is moderate thickening of the aortic valve. Aortic  valve regurgitation is not visualized. Aortic valve sclerosis is present,  with no evidence of aortic valve  stenosis.   7. The inferior vena cava is normal in size with greater than 50%  respiratory variability, suggesting right atrial pressure of 3 mmHg.   Epic records are reviewed at length today  CHA2DS2-VASc Score = 3  The patient's score is based upon: CHF History: 1 HTN History: 1 Diabetes History: 0 Stroke History: 0 Vascular Disease History: 0 (CAC score 0) Age Score: 1 Gender Score: 0       ASSESSMENT AND PLAN: 1. Persistent Atrial Fibrillation (ICD10:  I48.19) The patient's CHA2DS2-VASc score is 3, indicating a 3.2% annual risk of stroke.   S/p DCCV 03/30/22 Patient in afib, symptomatic despite rate control.  We discussed rhythm control options today. Would avoid class IC and Multaq with reduced EF. Dofetilide and amiodarone not ideal as patient is on methadone which can prolong QT. We discussed ablation and he is agreeable to consultation with EP, will refer.  Continue Eliquis 5 mg BID Continue Lopressor 50 mg BID (patient decreased this)  2. Secondary Hypercoagulable State (ICD10:  D68.69) The patient is at significant risk for stroke/thromboembolism based upon his CHA2DS2-VASc Score of 3.  Continue Apixaban (Eliquis).   3. Chronic systolic CHF EF [id number] On GDMT per primary cardiology team Fluid status appears stable today.  4. Obstructive sleep apnea The importance of adequate treatment of sleep apnea was discussed today in order to improve our ability to maintain sinus rhythm long term. Encouraged compliance with CPAP.  5. HTN Stable, no changes today.   Follow up with EP to establish care/discuss ablation.    [facility] [address] [place],  [place] [address] [phone] 04/14/2022 4:01 PM

## 2022-04-18 ENCOUNTER — Ambulatory Visit (HOSPITAL_COMMUNITY): Payer: Medicare Other | Attending: Cardiology

## 2022-04-18 DIAGNOSIS — I5022 Chronic systolic (congestive) heart failure: Secondary | ICD-10-CM | POA: Insufficient documentation

## 2022-04-18 LAB — ECHOCARDIOGRAM COMPLETE: S' Lateral: 4.1 cm

## 2022-05-02 ENCOUNTER — Telehealth: Payer: Self-pay | Admitting: Cardiovascular Disease

## 2022-05-02 ENCOUNTER — Encounter: Payer: Self-pay | Admitting: Cardiovascular Disease

## 2022-05-02 ENCOUNTER — Ambulatory Visit: Payer: Medicare Other | Attending: Cardiovascular Disease | Admitting: Cardiovascular Disease

## 2022-05-02 VITALS — BP 102/68 | HR 95 | Ht 71.0 in | Wt 229.0 lb

## 2022-05-02 DIAGNOSIS — I4819 Other persistent atrial fibrillation: Secondary | ICD-10-CM | POA: Diagnosis not present

## 2022-05-02 DIAGNOSIS — I4891 Unspecified atrial fibrillation: Secondary | ICD-10-CM

## 2022-05-02 MED ORDER — AMIODARONE HCL 200 MG PO TABS: ORAL_TABLET | ORAL | 3 refills | Status: DC

## 2022-05-02 NOTE — Patient Instructions (Addendum)
Medication Instructions:  START Amiodarone 200mg  tablets by mouth twice daily for 10 days, then DECREASE to 200mg  once daily  *If you need a refill on your cardiac medications before your next appointment, please call your pharmacy*   Lab Work: CBC and BMET at [facility] on 8/7 If you have labs (blood work) drawn today and your tests are completely normal, you will receive your results only by: MyChart Message (if you have MyChart) OR A paper copy in the mail If you have any lab test that is abnormal or we need to change your treatment, we will call you to review the results.   Testing/Procedures: Cardioversion  Your physician has recommended that you have a Cardioversion (DCCV). Electrical Cardioversion uses a jolt of electricity to your heart either through paddles or wired patches attached to your chest. This is a controlled, usually prescheduled, procedure. Defibrillation is done under light anesthesia in the hospital, and you usually go home the day of the procedure. This is done to get your heart back into a normal rhythm. You are not awake for the procedure. Please see the instruction sheet given to you today.   Your physician has recommended that you have an ablation. Catheter ablation is a medical procedure used to treat some cardiac arrhythmias (irregular heartbeats). During catheter ablation, a long, thin, flexible tube is put into a blood vessel in your groin (upper thigh), or neck. This tube is called an ablation catheter. It is then guided to your heart through the blood vessel. Radio frequency waves destroy small areas of heart tissue where abnormal heartbeats may cause an arrhythmia to start. Please see the instruction sheet given to you today. You are scheduled for Atrial Fibrillation Ablation on Friday, August 30 with Dr. Halford Chessman.Please arrive at the Main Entrance A at [facility]: [address] [place], [place] [address] at 5:30 AM     Follow-Up: At [facility], you and your health needs are our priority.  As part of our continuing mission to provide you with exceptional heart care, we have created designated Provider Care Teams.  These Care Teams include your primary Cardiologist (physician) and Advanced Practice Providers (APPs -  Physician Assistants and Nurse Practitioners) who all work together to provide you with the care you need, when you need it.  We recommend signing up for the patient portal called "MyChart".  Sign up information is provided on this After Visit Summary.  MyChart is used to connect with patients for Virtual Visits (Telemedicine).  Patients are able to view lab/test results, encounter notes, upcoming appointments, etc.  Non-urgent messages can be sent to your provider as well.   To learn more about what you can do with MyChart, go to [email or web address].    Your next appointment:     Provider:   York Pellant, MD   Other Instructions    Dear Frank Carpenter  You are scheduled for a Cardioversion on Thursday, April 18 with Dr. Servando Salina.  Please arrive at the [facility] (Main Entrance A) at [facility]: [address] [place], [place] [address] at 8:30 AM.  DIET:  Nothing to eat or drink after midnight except a sip of water with medications (see medication instructions below)  MEDICATION INSTRUCTIONS: !!IF ANY NEW MEDICATIONS ARE STARTED AFTER TODAY, PLEASE NOTIFY YOUR PROVIDER AS SOON AS POSSIBLE!!  FYI: Medications such as Semaglutide (Ozempic, [place]), Tirzepatide (Mounjaro, Zepbound), Dulaglutide (Trulicity), etc ("GLP1 agonists") must be held around the time of  a procedure. Talk to your provider if you take one of these.  Continue taking your anticoagulant (blood thinner): Apixaban (Eliquis).  You will need to continue this after your procedure until you are told by your provider that it is safe to stop.    LABS:   Your labs will be done at the hospital prior to your procedure - you will need to  arrive 1 and 1/2 hours prior to your procedure. (Arrive at 8:30am)  FYI:  For your safety, and to allow [place] to monitor your vital signs accurately during the surgery/procedure we request: If you have artificial nails, gel coating, SNS etc, please have those removed prior to your surgery/procedure. Not having the nail coverings /polish removed may result in cancellation or delay of your surgery/procedure.  You must have a responsible person to drive you home and stay in the waiting area during your procedure. Failure to do so could result in cancellation.  Bring your insurance cards.  *Special Note: Every effort is made to have your procedure done on time. Occasionally there are emergencies that occur at the hospital that may cause delays. Please be patient if a delay does occur.

## 2022-05-02 NOTE — H&P (View-Only) (Signed)
Electrophysiology Office Note:    Date:  05/02/2022   ID:  Frank Carpenter, DOB 03/13/1950, MRN [id number]  PCP:  Jarrett SohoWharton, Courtney, PA-C   [facility] Providers Cardiologist:  Nanetta BattyJonathan Berry, MD Electrophysiologist:  Maurice SmallAugustus E Valaree Fresquez, MD     Referring MD: Danice GoltzFenton, Clint R, [place]   History of Present Illness:    Frank Carpenter is a 72 y.o. male with a hx listed below, significant for CHFrEF, OSA, referred for arrhythmia management.  He was diagnosed with atrial fibrillation at the time of an admission for CHF in December 2023.  His EF was newly depressed at 40 to 45%, attributed to rapid ventricular rates.  He was started on Eliquis and metoprolol for rate control.  Cardioversion was ordered, but he was admitted again with acute systolic heart failure prior to eventually undergoing cardioversion on January 24, 2022.  He remained in sinus rhythm for a few weeks, but his Apple Watch detected recurrence of atrial fibrillation in mid February 2024.  Repeat cardioversion was attempted on March 6 but unsuccessful.  He was seen in atrial fibrillation clinic on March 21, symptomatic and rate controlled atrial fibrillation.  He feels very fatigued in atrial fibrillation.   Past Medical History:  Diagnosis Date   Atrial fibrillation, chronic    Chronic systolic (congestive) heart failure    High cholesterol    OSA (obstructive sleep apnea)    Testosterone deficiency     Past Surgical History:  Procedure Laterality Date   CARDIOVERSION N/A 02/24/2022   Procedure: CARDIOVERSION;  Surgeon: Wendall StadeNishan, Peter C, MD;  [facility];  Service: Cardiovascular;  Laterality: N/A;   CARDIOVERSION N/A 03/30/2022   Procedure: CARDIOVERSION;  Surgeon: Meriam SpraguePemberton, Heather E, MD;  [facility];  Service: Cardiovascular;  Laterality: N/A;   NASAL SEPTUM SURGERY  04/2021    Current Medications: Current Meds  Medication Sig   apixaban (ELIQUIS) 5 MG TABS tablet Take 1 tablet (5 mg  total) by mouth 2 (two) times daily for 360 doses.   dapagliflozin propanediol (FARXIGA) 10 MG TABS tablet Take 1 tablet (10 mg total) by mouth daily.   docusate sodium (COLACE) 100 MG capsule Take 400 mg by mouth 2 (two) times daily.   ezetimibe (ZETIA) 10 MG tablet Take 10 mg by mouth every morning.   furosemide (LASIX) 40 MG tablet TAKE 1 TABLET BY MOUTH EVERY DAY   hydrOXYzine (ATARAX) 25 MG tablet Take 25 mg by mouth every 8 (eight) hours as needed for anxiety.   methadone (DOLOPHINE) 10 MG/ML solution Take 140 mg by mouth daily.   metoprolol tartrate (LOPRESSOR) 50 MG tablet Take 50 mg by mouth 2 (two) times daily.   mirtazapine (REMERON) 15 MG tablet Take 15 mg by mouth at bedtime.   Multiple Vitamin (MULTIVITAMIN) capsule Take 1 capsule by mouth daily.   sacubitril-valsartan (ENTRESTO) 24-26 MG Take 1 tablet by mouth 2 (two) times daily.   spironolactone (ALDACTONE) 25 MG tablet Take 0.5 tablets (12.5 mg total) by mouth daily.   testosterone cypionate (DEPOTESTOSTERONE CYPIONATE) 200 MG/ML injection Inject 100 mg into the muscle once a week. Thursday   [DISCONTINUED] metoprolol tartrate (LOPRESSOR) 100 MG tablet Take 1 tablet (100 mg total) by mouth 2 (two) times daily. (Patient taking differently: Take 50 mg by mouth 2 (two) times daily.)     Allergies:   Ezetimibe and Statins   Social and Family History: Reviewed in Epic  ROS:   Please see the history of  present illness.    All other systems reviewed and are negative.  EKGs/Labs/Other Studies Reviewed Today:    Echocardiogram:  TTE 04/18/2022  1. Left ventricular ejection fraction, by estimation, is 35 to 40%. The  left ventricle has moderately decreased function. The left ventricle demonstrates global hypokinesis. There is mild left ventricular hypertrophy. Left ventricular diastolic parameters are indeterminate.   2. Right ventricular systolic function is mildly reduced. The right ventricular size is normal. Tricuspid  regurgitation signal is inadequate for assessing PA pressure.   3. Left atrial size was severely dilated.   4. Right atrial size was mildly dilated.   5. The mitral valve is normal in structure. No evidence of mitral valve regurgitation. No evidence of mitral stenosis.   6. The aortic valve was not well visualized. Aortic valve regurgitation is not visualized. No aortic stenosis is present.   7. Aortic dilatation noted. There is dilatation of the ascending aorta,  measuring 42 mm.   8. The inferior vena cava is normal in size with greater than 50%  respiratory variability, suggesting right atrial pressure of 3 mmHg.    Monitors:   Stress testing:   Advanced imaging:    EKG:  Last EKG results: today - AF, frequent PVCs, Qt is about 450   Recent Labs: 01/15/2022: TSH 1.219 01/17/2022: Magnesium 1.9 02/21/2022: ALT 43; B Natriuretic Peptide 605.4 03/24/2022: BUN 28; Creatinine, Ser 1.05; Hemoglobin 19.3; Platelets 236; Potassium 4.4; Sodium 141     Physical Exam:    VS:  BP 102/68   Pulse 95   Ht 5' 11" (1.803 m)   Wt 229 lb (103.9 kg)   SpO2 96%   BMI 31.94 kg/m     Wt Readings from Last 3 Encounters:  05/02/22 229 lb (103.9 kg)  04/14/22 229 lb (103.9 kg)  03/30/22 220 lb (99.8 kg)     GEN:  Well nourished, well developed in no acute distress CARDIAC: RRR, no murmurs, rubs, gallops RESPIRATORY:  Normal work of breathing MUSCULOSKELETAL: no edema    ASSESSMENT & PLAN:    Atrial fibrillation Persistent, symptomatic despite rate control Left atrium is severely dilated (5.2cm) Because he is very symptomatic, I think it appropriate to pursue rhythm control.  He is not a good candidate for most antiarrhythmic drugs due to concurrent methadone use.  The risk of torsades is relatively low with oral amiodarone since it affects IKs more than Ikr.  Will start amiodarone and plan for repeat cardioversion until we can perform ablation. After discussing relative risks and  benefits of medical therapy, he agrees that ablation is appropriate strategy for long-term management. Continue apixaban  We discussed the indication, rationale, logistics, anticipated benefits, and potential risks of the ablation procedure including but not limited to -- bleed at the groin access site, chest pain, damage to nearby organs such as the diaphragm, lungs, or esophagus, need for a drainage tube, or prolonged hospitalization. I explained that the risk for stroke, heart attack, need for open chest surgery, or even death is very low but not zero. he  expressed understanding and wishes to proceed.   CHFrEF EF 40 to 45% Continue Entresto 24-26, spironolactone 25, metoprolol, Farxiga 10  OSA Compliance with CPAP encouraged  Chronic methadone use QT is not significantly prolonged  Secondary hypercoagulable state Continue apixaban for CHA2DS2-VASc score of 3  Frequent PVCs          Medication Adjustments/Labs and Tests Ordered: Current medicines are reviewed at length with the patient today.    Concerns regarding medicines are outlined above.  Orders Placed This Encounter  Procedures   EKG 12-Lead   No orders of the defined types were placed in this encounter.    Signed, Maurice Small, MD  05/02/2022 3:22 PM    [facility]

## 2022-05-02 NOTE — Telephone Encounter (Signed)
*  STAT* If patient is at the pharmacy, call can be transferred to refill team.   1. Which medications need to be refilled? (please list name of each medication and dose if known)   spironolactone (ALDACTONE) 25 MG tablet    2. Which pharmacy/location (including street and city if local pharmacy) is medication to be sent to? [id number] [place], [place] - [address]   3. Do they need a 30 day or 90 day supply? 90 day

## 2022-05-02 NOTE — Telephone Encounter (Signed)
Pt wants to let the office know hes running about 10 mins late.

## 2022-05-02 NOTE — Progress Notes (Signed)
Electrophysiology Office Note:    Date:  05/02/2022   ID:  Frank Carpenter, DOB 03/13/1950, MRN [id number]  PCP:  Jarrett SohoWharton, Courtney, PA-C   [facility] Providers Cardiologist:  Nanetta BattyJonathan Berry, MD Electrophysiologist:  Maurice SmallAugustus E Angeline Trick, MD     Referring MD: Danice GoltzFenton, Clint R, [place]   History of Present Illness:    Frank Carpenter is a 72 y.o. male with a hx listed below, significant for CHFrEF, OSA, referred for arrhythmia management.  He was diagnosed with atrial fibrillation at the time of an admission for CHF in December 2023.  His EF was newly depressed at 40 to 45%, attributed to rapid ventricular rates.  He was started on Eliquis and metoprolol for rate control.  Cardioversion was ordered, but he was admitted again with acute systolic heart failure prior to eventually undergoing cardioversion on January 24, 2022.  He remained in sinus rhythm for a few weeks, but his Apple Watch detected recurrence of atrial fibrillation in mid February 2024.  Repeat cardioversion was attempted on March 6 but unsuccessful.  He was seen in atrial fibrillation clinic on March 21, symptomatic and rate controlled atrial fibrillation.  He feels very fatigued in atrial fibrillation.   Past Medical History:  Diagnosis Date   Atrial fibrillation, chronic    Chronic systolic (congestive) heart failure    High cholesterol    OSA (obstructive sleep apnea)    Testosterone deficiency     Past Surgical History:  Procedure Laterality Date   CARDIOVERSION N/A 02/24/2022   Procedure: CARDIOVERSION;  Surgeon: Wendall StadeNishan, Peter C, MD;  [facility];  Service: Cardiovascular;  Laterality: N/A;   CARDIOVERSION N/A 03/30/2022   Procedure: CARDIOVERSION;  Surgeon: Meriam SpraguePemberton, Heather E, MD;  [facility];  Service: Cardiovascular;  Laterality: N/A;   NASAL SEPTUM SURGERY  04/2021    Current Medications: Current Meds  Medication Sig   apixaban (ELIQUIS) 5 MG TABS tablet Take 1 tablet (5 mg  total) by mouth 2 (two) times daily for 360 doses.   dapagliflozin propanediol (FARXIGA) 10 MG TABS tablet Take 1 tablet (10 mg total) by mouth daily.   docusate sodium (COLACE) 100 MG capsule Take 400 mg by mouth 2 (two) times daily.   ezetimibe (ZETIA) 10 MG tablet Take 10 mg by mouth every morning.   furosemide (LASIX) 40 MG tablet TAKE 1 TABLET BY MOUTH EVERY DAY   hydrOXYzine (ATARAX) 25 MG tablet Take 25 mg by mouth every 8 (eight) hours as needed for anxiety.   methadone (DOLOPHINE) 10 MG/ML solution Take 140 mg by mouth daily.   metoprolol tartrate (LOPRESSOR) 50 MG tablet Take 50 mg by mouth 2 (two) times daily.   mirtazapine (REMERON) 15 MG tablet Take 15 mg by mouth at bedtime.   Multiple Vitamin (MULTIVITAMIN) capsule Take 1 capsule by mouth daily.   sacubitril-valsartan (ENTRESTO) 24-26 MG Take 1 tablet by mouth 2 (two) times daily.   spironolactone (ALDACTONE) 25 MG tablet Take 0.5 tablets (12.5 mg total) by mouth daily.   testosterone cypionate (DEPOTESTOSTERONE CYPIONATE) 200 MG/ML injection Inject 100 mg into the muscle once a week. Thursday   [DISCONTINUED] metoprolol tartrate (LOPRESSOR) 100 MG tablet Take 1 tablet (100 mg total) by mouth 2 (two) times daily. (Patient taking differently: Take 50 mg by mouth 2 (two) times daily.)     Allergies:   Ezetimibe and Statins   Social and Family History: Reviewed in Epic  ROS:   Please see the history of  present illness.    All other systems reviewed and are negative.  EKGs/Labs/Other Studies Reviewed Today:    Echocardiogram:  TTE 04/18/2022  1. Left ventricular ejection fraction, by estimation, is 35 to 40%. The  left ventricle has moderately decreased function. The left ventricle demonstrates global hypokinesis. There is mild left ventricular hypertrophy. Left ventricular diastolic parameters are indeterminate.   2. Right ventricular systolic function is mildly reduced. The right ventricular size is normal. Tricuspid  regurgitation signal is inadequate for assessing PA pressure.   3. Left atrial size was severely dilated.   4. Right atrial size was mildly dilated.   5. The mitral valve is normal in structure. No evidence of mitral valve regurgitation. No evidence of mitral stenosis.   6. The aortic valve was not well visualized. Aortic valve regurgitation is not visualized. No aortic stenosis is present.   7. Aortic dilatation noted. There is dilatation of the ascending aorta,  measuring 42 mm.   8. The inferior vena cava is normal in size with greater than 50%  respiratory variability, suggesting right atrial pressure of 3 mmHg.    Monitors:   Stress testing:   Advanced imaging:    EKG:  Last EKG results: today - AF, frequent PVCs, Qt is about 450   Recent Labs: 01/15/2022: TSH 1.219 01/17/2022: Magnesium 1.9 02/21/2022: ALT 43; B Natriuretic Peptide 605.4 03/24/2022: BUN 28; Creatinine, Ser 1.05; Hemoglobin 19.3; Platelets 236; Potassium 4.4; Sodium 141     Physical Exam:    VS:  BP 102/68   Pulse 95   Ht 5\' 11"  (1.803 m)   Wt 229 lb (103.9 kg)   SpO2 96%   BMI 31.94 kg/m     Wt Readings from Last 3 Encounters:  05/02/22 229 lb (103.9 kg)  04/14/22 229 lb (103.9 kg)  03/30/22 220 lb (99.8 kg)     GEN:  Well nourished, well developed in no acute distress CARDIAC: RRR, no murmurs, rubs, gallops RESPIRATORY:  Normal work of breathing MUSCULOSKELETAL: no edema    ASSESSMENT & PLAN:    Atrial fibrillation Persistent, symptomatic despite rate control Left atrium is severely dilated (5.2cm) Because he is very symptomatic, I think it appropriate to pursue rhythm control.  He is not a good candidate for most antiarrhythmic drugs due to concurrent methadone use.  The risk of torsades is relatively low with oral amiodarone since it affects IKs more than Ikr.  Will start amiodarone and plan for repeat cardioversion until we can perform ablation. After discussing relative risks and  benefits of medical therapy, he agrees that ablation is appropriate strategy for long-term management. Continue apixaban  We discussed the indication, rationale, logistics, anticipated benefits, and potential risks of the ablation procedure including but not limited to -- bleed at the groin access site, chest pain, damage to nearby organs such as the diaphragm, lungs, or esophagus, need for a drainage tube, or prolonged hospitalization. I explained that the risk for stroke, heart attack, need for open chest surgery, or even death is very low but not zero. he  expressed understanding and wishes to proceed.   CHFrEF EF 40 to 45% Continue Entresto 24-26, spironolactone 25, metoprolol, Farxiga 10  OSA Compliance with CPAP encouraged  Chronic methadone use QT is not significantly prolonged  Secondary hypercoagulable state Continue apixaban for CHA2DS2-VASc score of 3  Frequent PVCs          Medication Adjustments/Labs and Tests Ordered: Current medicines are reviewed at length with the patient today.  Concerns regarding medicines are outlined above.  Orders Placed This Encounter  Procedures   EKG 12-Lead   No orders of the defined types were placed in this encounter.    Signed, Maurice Small, MD  05/02/2022 3:22 PM    [facility]

## 2022-05-04 ENCOUNTER — Other Ambulatory Visit: Payer: Self-pay

## 2022-05-04 MED ORDER — SPIRONOLACTONE 25 MG PO TABS: 12.5000 mg | ORAL_TABLET | Freq: Every day | ORAL | 3 refills | Status: DC

## 2022-05-11 DIAGNOSIS — G4731 Primary central sleep apnea: Secondary | ICD-10-CM | POA: Diagnosis not present

## 2022-05-11 DIAGNOSIS — I712 Thoracic aortic aneurysm, without rupture, unspecified: Secondary | ICD-10-CM | POA: Diagnosis not present

## 2022-05-11 DIAGNOSIS — I5022 Chronic systolic (congestive) heart failure: Secondary | ICD-10-CM | POA: Diagnosis not present

## 2022-05-11 DIAGNOSIS — I4891 Unspecified atrial fibrillation: Secondary | ICD-10-CM | POA: Diagnosis not present

## 2022-05-11 DIAGNOSIS — D6869 Other thrombophilia: Secondary | ICD-10-CM | POA: Diagnosis not present

## 2022-05-11 NOTE — Pre-Procedure Instructions (Signed)
Instructed patient on following items: Arrival time 0900 NPO after MN... Bp and anticoagulation meds ok with sips of water AM of procedure Responsible party to stay home after procedure and for 24hrs Have you missed any does of anticoagulant? Pt instructed to take eliquis as proscribed. States no missed does.

## 2022-05-12 ENCOUNTER — Encounter (HOSPITAL_COMMUNITY)
Admission: RE | Disposition: A | Discharge status: Home / Self Care | Payer: Self-pay | Source: Home / Self Care | Attending: Cardiology

## 2022-05-12 ENCOUNTER — Ambulatory Visit (HOSPITAL_COMMUNITY)
Admission: RE | Admit: 2022-05-12 | Discharge: 2022-05-12 | Disposition: A | Discharge status: Home / Self Care | Payer: Medicare Other | Attending: Cardiology | Admitting: Cardiology

## 2022-05-12 ENCOUNTER — Ambulatory Visit (HOSPITAL_BASED_OUTPATIENT_CLINIC_OR_DEPARTMENT_OTHER): Payer: Medicare Other | Admitting: Anesthesiology

## 2022-05-12 ENCOUNTER — Other Ambulatory Visit: Payer: Self-pay

## 2022-05-12 ENCOUNTER — Ambulatory Visit (HOSPITAL_COMMUNITY): Payer: Medicare Other | Admitting: Anesthesiology

## 2022-05-12 ENCOUNTER — Encounter (HOSPITAL_COMMUNITY): Payer: Self-pay | Admitting: Cardiology

## 2022-05-12 DIAGNOSIS — Z7901 Long term (current) use of anticoagulants: Secondary | ICD-10-CM | POA: Diagnosis not present

## 2022-05-12 DIAGNOSIS — I110 Hypertensive heart disease with heart failure: Secondary | ICD-10-CM

## 2022-05-12 DIAGNOSIS — D6869 Other thrombophilia: Secondary | ICD-10-CM | POA: Diagnosis not present

## 2022-05-12 DIAGNOSIS — Z79891 Long term (current) use of opiate analgesic: Secondary | ICD-10-CM | POA: Diagnosis not present

## 2022-05-12 DIAGNOSIS — I509 Heart failure, unspecified: Secondary | ICD-10-CM | POA: Diagnosis not present

## 2022-05-12 DIAGNOSIS — G4733 Obstructive sleep apnea (adult) (pediatric): Secondary | ICD-10-CM | POA: Insufficient documentation

## 2022-05-12 DIAGNOSIS — I5022 Chronic systolic (congestive) heart failure: Secondary | ICD-10-CM | POA: Diagnosis not present

## 2022-05-12 DIAGNOSIS — I4819 Other persistent atrial fibrillation: Secondary | ICD-10-CM | POA: Insufficient documentation

## 2022-05-12 DIAGNOSIS — I493 Ventricular premature depolarization: Secondary | ICD-10-CM | POA: Insufficient documentation

## 2022-05-12 DIAGNOSIS — Z79899 Other long term (current) drug therapy: Secondary | ICD-10-CM | POA: Diagnosis not present

## 2022-05-12 DIAGNOSIS — Z87891 Personal history of nicotine dependence: Secondary | ICD-10-CM

## 2022-05-12 DIAGNOSIS — I4891 Unspecified atrial fibrillation: Secondary | ICD-10-CM

## 2022-05-12 DIAGNOSIS — I480 Paroxysmal atrial fibrillation: Secondary | ICD-10-CM

## 2022-05-12 HISTORY — PX: CARDIOVERSION: SHX1299

## 2022-05-12 LAB — POCT I-STAT, CHEM 8
BUN: 22 mg/dL (ref 8–23)
Calcium, Ion: 1.2 mmol/L (ref 1.15–1.40)
Chloride: 99 mmol/L (ref 98–111)
Creatinine, Ser: 1 mg/dL (ref 0.61–1.24)
Glucose, Bld: 116 mg/dL — ABNORMAL HIGH (ref 70–99)
HCT: 53 % — ABNORMAL HIGH (ref 39.0–52.0)
Hemoglobin: 18 g/dL — ABNORMAL HIGH (ref 13.0–17.0)
Potassium: 4.3 mmol/L (ref 3.5–5.1)
Sodium: 138 mmol/L (ref 135–145)
TCO2: 29 mmol/L (ref 22–32)

## 2022-05-12 LAB — GLUCOSE, CAPILLARY: Glucose-Capillary: 111 mg/dL — ABNORMAL HIGH (ref 70–99)

## 2022-05-12 SURGERY — CARDIOVERSION
Anesthesia: General

## 2022-05-12 MED ORDER — SODIUM CHLORIDE 0.9 % IV SOLN: INTRAVENOUS | Status: DC | PRN

## 2022-05-12 MED ORDER — PROPOFOL 10 MG/ML IV BOLUS
INTRAVENOUS | Status: DC | PRN
  Administered 2022-05-12 (×2): 50 mg via INTRAVENOUS

## 2022-05-12 MED ORDER — SODIUM CHLORIDE 0.9 % IV SOLN: INTRAVENOUS | Status: DC

## 2022-05-12 MED ORDER — ETOMIDATE 2 MG/ML IV SOLN
INTRAVENOUS | Status: DC | PRN
  Administered 2022-05-12: 10 mg via INTRAVENOUS

## 2022-05-12 SURGICAL SUPPLY — 1 items: ELECT DEFIB PAD ADLT CADENCE (PAD) ×1 IMPLANT

## 2022-05-12 NOTE — Transfer of Care (Signed)
Immediate Anesthesia Transfer of Care Note  Patient: Frank Carpenter  Procedure(s) Performed: CARDIOVERSION  Patient Location: Cath Lab  Anesthesia Type:General  Level of Consciousness: drowsy and patient cooperative  Airway & Oxygen Therapy: Patient Spontanous Breathing and Patient connected to nasal cannula oxygen  Post-op Assessment: Report given to RN and Post -op Vital signs reviewed and stable  Post vital signs: Reviewed and stable  Last Vitals:  Vitals Value Taken Time  BP    Temp    Pulse    Resp    SpO2      Last Pain:  Vitals:   05/12/22 0913  TempSrc: Temporal         Complications: No notable events documented.

## 2022-05-12 NOTE — Anesthesia Procedure Notes (Signed)
Procedure Name: MAC Date/Time: 05/12/2022 10:00 AM  Performed by: Adria Dill, CRNAPre-anesthesia Checklist: Patient identified, Emergency Drugs available, Suction available and Patient being monitored Patient Re-evaluated:Patient Re-evaluated prior to induction Oxygen Delivery Method: Nasal cannula Preoxygenation: Pre-oxygenation with 100% oxygen Induction Type: IV induction Placement Confirmation: positive ETCO2 and breath sounds checked- equal and bilateral Dental Injury: Teeth and Oropharynx as per pre-operative assessment

## 2022-05-12 NOTE — CV Procedure (Signed)
   Electrical Cardioversion Procedure Note Frank Carpenter [id number] 11/21/1950  Procedure: Electrical Cardioversion Indications:  Atrial Fibrillation  Time Out: Verified patient identification, verified procedure,medications/allergies/relevent history reviewed, required imaging and test results available.  Performed  Procedure Details  The patient signed informed consent.   The patient was NPO past midnight. Has had therapeutic anticoagulation with Eliquis greater than 3 weeks. The patient denies any interruption of anticoagulation.  Anesthesia was administered by Dr. Jean Rosenthal.  Adequate airway was maintained throughout and vital followed per protocol.  He was cardioverted x 2 with 200J of biphasic synchronized energy.  He converted to NSR.  There were no apparent complications.  The patient tolerated the procedure well and had normal neuro status and respiratory status post procedure with vitals stable as recorded elsewhere.     IMPRESSION:  Successful cardioversion of atrial fibrillation   Follow up:  We will arrange follow up with his primary cardiologist.  He will continue on current medical therapy.  The patient advised to continue anticoagulation.  Frank Carpenter 05/12/2022, 10:05 AM

## 2022-05-12 NOTE — Interval H&P Note (Signed)
History and Physical Interval Note:  05/12/2022 9:45 AM  Frank Carpenter  has presented today for surgery, with the diagnosis of AFIB.  The various methods of treatment have been discussed with the patient and family. After consideration of risks, benefits and other options for treatment, the patient has consented to  Procedure(s): CARDIOVERSION (N/A) as a surgical intervention.  The patient's history has been reviewed, patient examined, no change in status, stable for surgery.  I have reviewed the patient's chart and labs.  Questions were answered to the patient's satisfaction.     Phillip Sandler

## 2022-05-12 NOTE — Discharge Instructions (Signed)

## 2022-05-12 NOTE — Anesthesia Preprocedure Evaluation (Addendum)
Anesthesia Evaluation  Patient identified by MRN, date of birth, ID band Patient awake    Reviewed: Allergy & Precautions, NPO status , Patient's Chart, lab work & pertinent test results, reviewed documented beta blocker date and time   History of Anesthesia Complications Negative for: history of anesthetic complications  Airway Mallampati: II  TM Distance: >3 FB Neck ROM: Full    Dental  (+) Caps, Dental Advisory Given   Pulmonary sleep apnea (no longer uses CPAP) , former smoker   breath sounds clear to auscultation       Cardiovascular hypertension, Pt. on medications and Pt. on home beta blockers (-) angina + Peripheral Vascular Disease and +CHF (Entresto)  + dysrhythmias Atrial Fibrillation  Rhythm:Irregular Rate:Normal  03/2022 ECHO: EF 35-40%, mod decreased LVF with global hypokinesis, mildly reduced RVF, severely dilated LA, , no significant valvular abnormalities   Neuro/Psych   Anxiety     negative neurological ROS     GI/Hepatic negative GI ROS, Neg liver ROS,,,  Endo/Other  Glu 116  Renal/GU      Musculoskeletal   Abdominal   Peds  Hematology Eliquis   Anesthesia Other Findings   Reproductive/Obstetrics                              Anesthesia Physical Anesthesia Plan  ASA: 4  Anesthesia Plan: General   Post-op Pain Management: Minimal or no pain anticipated   Induction: Intravenous  PONV Risk Score and Plan: 2 and Treatment may vary due to age or medical condition  Airway Management Planned: Natural Airway and Mask  Additional Equipment: None  Intra-op Plan:   Post-operative Plan:   Informed Consent: I have reviewed the patients History and Physical, chart, labs and discussed the procedure including the risks, benefits and alternatives for the proposed anesthesia with the patient or authorized representative who has indicated his/her understanding and acceptance.      Dental advisory given  Plan Discussed with: CRNA and Surgeon  Anesthesia Plan Comments:         Anesthesia Quick Evaluation

## 2022-05-12 NOTE — Anesthesia Postprocedure Evaluation (Signed)
Anesthesia Post Note  Patient: Frank Carpenter  Procedure(s) Performed: CARDIOVERSION     Patient location during evaluation: Cath Lab Anesthesia Type: General Level of consciousness: awake and alert, patient cooperative and oriented Pain management: pain level controlled Vital Signs Assessment: post-procedure vital signs reviewed and stable Respiratory status: nonlabored ventilation, spontaneous breathing and respiratory function stable Cardiovascular status: blood pressure returned to baseline and stable Postop Assessment: no apparent nausea or vomiting and able to ambulate Anesthetic complications: no   No notable events documented.  Last Vitals:  Vitals:   05/12/22 1026 05/12/22 1036  BP: 116/79 115/86  Pulse: 65 68  Resp: 14 15  Temp:    SpO2: 97% 96%    Last Pain:  Vitals:   05/12/22 1036  TempSrc:   PainSc: 0-No pain                 Ledarrius Beauchaine,E. Bradley Bostelman

## 2022-05-16 ENCOUNTER — Telehealth: Payer: Self-pay | Admitting: Cardiovascular Disease

## 2022-05-16 NOTE — Telephone Encounter (Signed)
Patient has been enrolled in [facility] for HF mediation [employer] ID  [id number]  CARD NO. [id number]   CARD STATUS Active   BIN [id number]   PCN [id number]   PC GROUP [id number]  Alternative to Eliquis would be Warfarin, does not wanted to go for frequent INR checks. Now that he is enrolled in the grant for the other 2 brand name medication he would be able to afford Eliquis.

## 2022-05-16 NOTE — Telephone Encounter (Signed)
Patient states medicare informed her that eliquis, entresto, and farxigo are all tier 3 and they can not afford the medications anymore. They will need tier 1 medications which will give them at $0 copay. She stated she tried to apply for patient assistance but was informed that was just for patients with medicaid.

## 2022-05-16 NOTE — Telephone Encounter (Signed)
Pt c/o medication issue:  1. Name of Medication:  apixaban (ELIQUIS) 5 MG TABS tablet  sacubitril-valsartan (ENTRESTO) 24-26 MG  dapagliflozin propanediol (FARXIGA) 10 MG TABS tablet   2. How are you currently taking this medication (dosage and times per day)?   3. Are you having a reaction (difficulty breathing--STAT)?   4. What is your medication issue? Pts wife, Chip Boer, states they have gotten in to a donut hole with medicare, and these medications are just too expensive now. Chip Boer stated they would like to see what they can do about getting generics for the medications. Please advise.

## 2022-05-17 NOTE — Telephone Encounter (Signed)
Wife called reporting pharmacy was unable to process Entresto through [facility] co-pay card.   Called pharmacy to find out about problem. [facility] Per pharmacy, there is system problem. Payers not sending response back to the pharmacy.  Trinna Post will try to resubmit tomorrow.    Called wife back reporting the issue. Per wife he has few days left. Can provide samples if issue is not resolved by Friday.

## 2022-05-19 ENCOUNTER — Telehealth: Payer: Self-pay | Admitting: Pharmacist

## 2022-05-19 NOTE — Telephone Encounter (Signed)
Pt wife called and LVM for Susa Day to call her back about her husband

## 2022-05-20 MED ORDER — METOPROLOL TARTRATE 50 MG PO TABS: 50.0000 mg | ORAL_TABLET | Freq: Two times a day (BID) | ORAL | 3 refills | Status: DC

## 2022-05-20 NOTE — Telephone Encounter (Signed)
Wife reports pharmacy were able to process co-pay card for [facility]. He is out of refill on metoprolol. Will send prescription for metoprolol. Patient has appointment with Dr. Allyson Sabal next week.

## 2022-05-20 NOTE — Addendum Note (Signed)
Addended by: Tylene Fantasia on: 05/20/2022 07:59 AM   Modules accepted: Orders

## 2022-05-25 ENCOUNTER — Telehealth: Payer: Self-pay

## 2022-05-25 NOTE — Telephone Encounter (Signed)
Spoke with pt's Wife and his procedure has been moved up to 7/26.  He will have labs at [facility] on 7/10  I will send updated staff message to all. Instruction letters will be sent via MyChart per Wife's request.

## 2022-05-31 ENCOUNTER — Encounter: Payer: Self-pay | Admitting: Cardiovascular Disease

## 2022-05-31 ENCOUNTER — Ambulatory Visit: Payer: Medicare Other | Attending: Cardiovascular Disease | Admitting: Cardiovascular Disease

## 2022-05-31 VITALS — BP 118/76 | HR 61 | Ht 71.0 in | Wt 226.6 lb

## 2022-05-31 DIAGNOSIS — E782 Mixed hyperlipidemia: Secondary | ICD-10-CM | POA: Diagnosis not present

## 2022-05-31 DIAGNOSIS — I10 Essential (primary) hypertension: Secondary | ICD-10-CM | POA: Diagnosis not present

## 2022-05-31 DIAGNOSIS — I5023 Acute on chronic systolic (congestive) heart failure: Secondary | ICD-10-CM

## 2022-05-31 DIAGNOSIS — I4891 Unspecified atrial fibrillation: Secondary | ICD-10-CM

## 2022-05-31 DIAGNOSIS — G4733 Obstructive sleep apnea (adult) (pediatric): Secondary | ICD-10-CM | POA: Diagnosis not present

## 2022-05-31 DIAGNOSIS — I7121 Aneurysm of the ascending aorta, without rupture: Secondary | ICD-10-CM

## 2022-05-31 DIAGNOSIS — Z720 Tobacco use: Secondary | ICD-10-CM

## 2022-05-31 NOTE — Patient Instructions (Signed)
Medication Instructions:  Your physician recommends that you continue on your current medications as directed. Please refer to the Current Medication list given to you today.  *If you need a refill on your cardiac medications before your next appointment, please call your pharmacy*   Follow-Up: At [facility], you and your health needs are our priority.  As part of our continuing mission to provide you with exceptional heart care, we have created designated Provider Care Teams.  These Care Teams include your primary Cardiologist (physician) and Advanced Practice Providers (APPs -  Physician Assistants and Nurse Practitioners) who all work together to provide you with the care you need, when you need it.  We recommend signing up for the patient portal called "MyChart".  Sign up information is provided on this After Visit Summary.  MyChart is used to connect with patients for Virtual Visits (Telemedicine).  Patients are able to view lab/test results, encounter notes, upcoming appointments, etc.  Non-urgent messages can be sent to your provider as well.   To learn more about what you can do with MyChart, go to [email or web address].    Your next appointment:   3 month(s)  Provider:   Jonathan Berry, MD  

## 2022-05-31 NOTE — Assessment & Plan Note (Signed)
History of essential hypertension blood pressure measured today at 118/76.  He is on metoprolol and Entresto.

## 2022-05-31 NOTE — Assessment & Plan Note (Signed)
History of obstructive sleep apnea on CPAP. 

## 2022-05-31 NOTE — Assessment & Plan Note (Signed)
History of small ascending thoracic aortic aneurysm measuring 42 mm by 2D echo performed 04/18/2022.

## 2022-05-31 NOTE — Progress Notes (Signed)
05/31/2022 Theodis Shove Carpenter   [birth year]  [id number]  Primary Physician Jarrett Soho, PA-C Primary Cardiologist: Runell Gess MD Nicholes Calamity, [name]  HPI:  Frank Carpenter is a 72 y.o.  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  He was referred by Jarrett Soho, PA-C for evaluation of progressive dyspnea.  I last saw him in the office 06/30/2021.  His cardiovascular risk factor profile is notable for discontinue tobacco abuse having smoked 20 pack years and stopped 5 to 6 years ago.  He has treated hypertension, untreated hyperlipidemia because of statin intolerance.  There is no family history.  Is never had an attack or stroke.  He denies chest pain but complains of increasing dyspnea exertion over the last 5 to 6 months.  Since I saw him in the office a year ago he was admitted over Christmas 2023 with A-fib with RVR and heart failure.  He was anticoagulated and rate controlled.  Since that time he had 3 cardioversions and several admissions for heart failure.  He did have a coronary calcium score performed 06/26/2019 which was 0.  5 7 his 2D echo performed 04/18/2022 revealed EF of 35 to 40% with severe left atrial dilatation.  Interestingly, echo performed 06/26/2019 showed normal LV function with only mild left atrial dilatation.  He has seen Dr. Nelly Laurence in the office in early April who had arranged for him to undergo A-fib ablation.  He underwent successful cardioversion by Dr. Servando Salina on amiodarone 05/12/2022 and he remains in sinus rhythm today with PVCs.     Current Meds  Medication Sig   amiodarone (PACERONE) 200 MG tablet Take 1 tablet by mouth twice daily for 10 days, then decrease to 1 tablet once daily (Patient taking differently: Take 200 mg by mouth 2 (two) times daily. Take 1 tablet by mouth twice daily for 10 days, then decrease to 1 tablet once daily)   apixaban (ELIQUIS) 5 MG  TABS tablet Take 1 tablet (5 mg total) by mouth 2 (two) times daily for 360 doses.   dapagliflozin propanediol (FARXIGA) 10 MG TABS tablet Take 1 tablet (10 mg total) by mouth daily.   docusate sodium (COLACE) 100 MG capsule Take 300-400 mg by mouth See admin instructions. Take 300 mg in the morning and 400 mg at bedtime   furosemide (LASIX) 40 MG tablet TAKE 1 TABLET BY MOUTH EVERY DAY   hydrOXYzine (ATARAX) 25 MG tablet Take 25 mg by mouth every 8 (eight) hours as needed for anxiety.   methadone (DOLOPHINE) 10 MG/ML solution Take 135 mg by mouth daily.   metoprolol tartrate (LOPRESSOR) 50 MG tablet Take 1 tablet (50 mg total) by mouth 2 (two) times daily.   mirtazapine (REMERON) 15 MG tablet Take 15 mg by mouth at bedtime.   Multiple Vitamin (MULTIVITAMIN) capsule Take 1 capsule by mouth daily.   sacubitril-valsartan (ENTRESTO) 24-26 MG Take 1 tablet by mouth 2 (two) times daily.   spironolactone (ALDACTONE) 25 MG tablet Take 0.5 tablets (12.5 mg total) by mouth daily.   testosterone cypionate (DEPOTESTOSTERONE CYPIONATE) 200 MG/ML injection Inject 100 mg into the muscle once a week. Thursday     Allergies  Allergen Reactions   Ezetimibe     Other Reaction(s): Sick feeling   Statins     Other reaction(s): Other (See Comments) Fatigue    Social History   Socioeconomic History   Marital  status: Married    Spouse name: Not on file   Number of children: Not on file   Years of education: Not on file   Highest education level: Not on file  Occupational History   Occupation: retired  Tobacco Use   Smoking status: Former    Packs/day: 1.00    Years: 15.00    Additional pack years: 0.00    Total pack years: 15.00    Types: Cigarettes    Quit date: 2004    Years since quitting: 20.3   Smokeless tobacco: Never  Substance and Sexual Activity   Alcohol use: Yes    Alcohol/week: 2.0 standard drinks of alcohol    Types: 2 Cans of beer per week    Comment: rare   Drug use: Not  Currently    Types: Marijuana    Comment: hydrocodone prescribed for pain   Sexual activity: Not on file  Other Topics Concern   Not on file  Social History Narrative   Not on file   Social Determinants of Health   Financial Resource Strain: Not on file  Food Insecurity: No Food Insecurity (02/22/2022)   Hunger Vital Sign    Worried About Running Out of Food in the Last Year: Never true    Ran Out of Food in the Last Year: Never true  Transportation Needs: No Transportation Needs (02/22/2022)   PRAPARE - Administrator, Civil Service (Medical): No    Lack of Transportation (Non-Medical): No  Physical Activity: Not on file  Stress: Not on file  Social Connections: Not on file  Intimate Partner Violence: Not At Risk (02/22/2022)   Humiliation, Afraid, Rape, and Kick questionnaire    Fear of Current or Ex-Partner: No    Emotionally Abused: No    Physically Abused: No    Sexually Abused: No     Review of Systems: General: negative for chills, fever, night sweats or weight changes.  Cardiovascular: negative for chest pain, dyspnea on exertion, edema, orthopnea, palpitations, paroxysmal nocturnal dyspnea or shortness of breath Dermatological: negative for rash Respiratory: negative for cough or wheezing Urologic: negative for hematuria Abdominal: negative for nausea, vomiting, diarrhea, bright red blood per rectum, melena, or hematemesis Neurologic: negative for visual changes, syncope, or dizziness All other systems reviewed and are otherwise negative except as noted above.    Blood pressure 118/76, pulse 61, height 5\' 11"  (1.803 m), weight 226 lb 9.6 oz (102.8 kg), SpO2 93 %.  General appearance: alert and no distress Neck: no adenopathy, no carotid bruit, no JVD, supple, symmetrical, trachea midline, and thyroid not enlarged, symmetric, no tenderness/mass/nodules Lungs: clear to auscultation bilaterally Heart: regular rate and rhythm, S1, S2 normal, no murmur,  click, rub or gallop Extremities: extremities normal, atraumatic, no cyanosis or edema Pulses: 2+ and symmetric Skin: Skin color, texture, turgor normal. No rashes or lesions Neurologic: Grossly normal  EKG sinus rhythm at 72 with trigeminal PVCs.  I personally reviewed this EKG    ASSESSMENT AND PLAN:   Tobacco abuse History tobacco abuse having smoked for 20 pack years and stopped approximately 6-7 years ago.  Essential hypertension History of essential hypertension blood pressure measured today at 118/76.  He is on metoprolol and Entresto.  Hyperlipidemia History of dyslipidemia not on statin therapy with lipid profile performed 01/15/2022 revealing total cholesterol 151, LDL of 91 and HDL of 44.  Of note, his coronary calcium score was 0.  OSA (obstructive sleep apnea) History of obstructive sleep apnea on  CPAP  Thoracic aortic aneurysm (HCC) History of small ascending thoracic aortic aneurysm measuring 42 mm by 2D echo performed 04/18/2022.  Atrial fibrillation with RVR (HCC) History of PAF status post multiple cardioversions most recently by Dr. Servando Salina on 05/12/2022 on amiodarone which was successful.  Patient felt good that day but apparently went back in A-fib the following day according to his "Apple Watch".  He is on Eliquis.  He saw Dr. Nelly Laurence prior to his cardioversion who had arranged for him to undergo A-fib ablation sometime this summer.  He does have severe left atrial enlargement which is new since an echo a year ago.  Acute on chronic systolic (congestive) heart failure (HCC) Systolic and diastolic heart failure most recent ejection fraction by echo 04/18/2022 was 35 to 40% with biatrial enlargement and no significant valvular abnormalities.  It should be noted that a year ago his EF was normal and his left atrium was only mildly enlarged.  He is on guideline directed optimal medical therapy.     Runell Gess MD FACP,FACC,FAHA, [facility] 05/31/2022 2:57 PM

## 2022-05-31 NOTE — Assessment & Plan Note (Signed)
History of PAF status post multiple cardioversions most recently by Dr. Servando Salina on 05/12/2022 on amiodarone which was successful.  Patient felt good that day but apparently went back in A-fib the following day according to his "Apple Watch".  He is on Eliquis.  He saw Dr. Nelly Laurence prior to his cardioversion who had arranged for him to undergo A-fib ablation sometime this summer.  He does have severe left atrial enlargement which is new since an echo a year ago.

## 2022-05-31 NOTE — Assessment & Plan Note (Signed)
Systolic and diastolic heart failure most recent ejection fraction by echo 04/18/2022 was 35 to 40% with biatrial enlargement and no significant valvular abnormalities.  It should be noted that a year ago his EF was normal and his left atrium was only mildly enlarged.  He is on guideline directed optimal medical therapy.

## 2022-05-31 NOTE — Assessment & Plan Note (Signed)
History of dyslipidemia not on statin therapy with lipid profile performed 01/15/2022 revealing total cholesterol 151, LDL of 91 and HDL of 44.  Of note, his coronary calcium score was 0.

## 2022-05-31 NOTE — Assessment & Plan Note (Signed)
History tobacco abuse having smoked for 20 pack years and stopped approximately 6-7 years ago.

## 2022-06-01 DIAGNOSIS — L814 Other melanin hyperpigmentation: Secondary | ICD-10-CM | POA: Diagnosis not present

## 2022-06-01 DIAGNOSIS — Z85828 Personal history of other malignant neoplasm of skin: Secondary | ICD-10-CM | POA: Diagnosis not present

## 2022-06-01 DIAGNOSIS — L821 Other seborrheic keratosis: Secondary | ICD-10-CM | POA: Diagnosis not present

## 2022-06-01 DIAGNOSIS — D2261 Melanocytic nevi of right upper limb, including shoulder: Secondary | ICD-10-CM | POA: Diagnosis not present

## 2022-06-01 DIAGNOSIS — D2262 Melanocytic nevi of left upper limb, including shoulder: Secondary | ICD-10-CM | POA: Diagnosis not present

## 2022-06-01 DIAGNOSIS — L905 Scar conditions and fibrosis of skin: Secondary | ICD-10-CM | POA: Diagnosis not present

## 2022-06-01 DIAGNOSIS — D1801 Hemangioma of skin and subcutaneous tissue: Secondary | ICD-10-CM | POA: Diagnosis not present

## 2022-06-01 DIAGNOSIS — D225 Melanocytic nevi of trunk: Secondary | ICD-10-CM | POA: Diagnosis not present

## 2022-06-01 DIAGNOSIS — L570 Actinic keratosis: Secondary | ICD-10-CM | POA: Diagnosis not present

## 2022-06-07 ENCOUNTER — Telehealth: Payer: Self-pay

## 2022-06-07 DIAGNOSIS — G4733 Obstructive sleep apnea (adult) (pediatric): Secondary | ICD-10-CM | POA: Diagnosis not present

## 2022-06-07 NOTE — Telephone Encounter (Signed)
Pt's procedure has been moved from 7/26 to 08/04/22.  Pt will have labs done at [facility] on 6/28..  CT Scan has been moved to 7/5 at [facility]  Instruction letters sent via MyChart per pt's request.

## 2022-06-27 ENCOUNTER — Telehealth (HOSPITAL_BASED_OUTPATIENT_CLINIC_OR_DEPARTMENT_OTHER): Payer: Self-pay | Admitting: Family

## 2022-06-27 NOTE — Telephone Encounter (Signed)
Patient is scheduled cor a cardiac ct on 07/29/22---asked Gillian Shields, NP if the patient still needed the CTA chest/aorta.  She states she will message Rockwell Alexandria, RN and have the tech image the aorta as well. Patient will not need to have both studies

## 2022-07-06 DIAGNOSIS — D6869 Other thrombophilia: Secondary | ICD-10-CM | POA: Diagnosis not present

## 2022-07-06 DIAGNOSIS — I5022 Chronic systolic (congestive) heart failure: Secondary | ICD-10-CM | POA: Diagnosis not present

## 2022-07-06 DIAGNOSIS — I4891 Unspecified atrial fibrillation: Secondary | ICD-10-CM | POA: Diagnosis not present

## 2022-07-06 DIAGNOSIS — G4731 Primary central sleep apnea: Secondary | ICD-10-CM | POA: Diagnosis not present

## 2022-07-07 DIAGNOSIS — R948 Abnormal results of function studies of other organs and systems: Secondary | ICD-10-CM | POA: Diagnosis not present

## 2022-07-22 DIAGNOSIS — I4891 Unspecified atrial fibrillation: Secondary | ICD-10-CM | POA: Diagnosis not present

## 2022-07-22 DIAGNOSIS — I4819 Other persistent atrial fibrillation: Secondary | ICD-10-CM | POA: Diagnosis not present

## 2022-07-22 LAB — BASIC METABOLIC PANEL

## 2022-07-22 LAB — CBC
MCH: 32.1 pg (ref 26.6–33.0)
MCHC: 33.6 g/dL (ref 31.5–35.7)
WBC: 8.5 10*3/uL (ref 3.4–10.8)

## 2022-07-23 LAB — CBC
Hematocrit: 50.9 % (ref 37.5–51.0)
Hemoglobin: 17.1 g/dL (ref 13.0–17.7)
MCV: 96 fL (ref 79–97)
Platelets: 222 10*3/uL (ref 150–450)
RBC: 5.32 x10E6/uL (ref 4.14–5.80)
RDW: 12.5 % (ref 11.6–15.4)

## 2022-07-23 LAB — BASIC METABOLIC PANEL
BUN/Creatinine Ratio: 18 (ref 10–24)
Glucose: 85 mg/dL (ref 70–99)
Potassium: 4.5 mmol/L (ref 3.5–5.2)
Sodium: 136 mmol/L (ref 134–144)
eGFR: 85 mL/min/{1.73_m2} (ref >59)

## 2022-07-24 NOTE — Pre-Procedure Instructions (Signed)
Patient is scheduled for procedure that requires anesthesia.  Anesthesia requires certain medications for be held before procedure.  Spoke with patients wife Lynden Ang,  she and patient are award that last dose of Farixga should be Sunday 7/7.

## 2022-07-26 ENCOUNTER — Encounter (HOSPITAL_COMMUNITY): Payer: Self-pay

## 2022-07-29 ENCOUNTER — Other Ambulatory Visit (HOSPITAL_COMMUNITY): Payer: Self-pay | Admitting: *Deleted

## 2022-07-29 ENCOUNTER — Ambulatory Visit (HOSPITAL_COMMUNITY)
Admission: RE | Admit: 2022-07-29 | Discharge: 2022-07-29 | Disposition: A | Discharge status: Home / Self Care | Payer: Medicare Other | Source: Ambulatory Visit | Attending: Cardiovascular Disease | Admitting: Cardiovascular Disease

## 2022-07-29 DIAGNOSIS — I4891 Unspecified atrial fibrillation: Secondary | ICD-10-CM

## 2022-07-29 DIAGNOSIS — I7121 Aneurysm of the ascending aorta, without rupture: Secondary | ICD-10-CM | POA: Insufficient documentation

## 2022-07-29 DIAGNOSIS — I4819 Other persistent atrial fibrillation: Secondary | ICD-10-CM | POA: Insufficient documentation

## 2022-07-29 DIAGNOSIS — S2241XD Multiple fractures of ribs, right side, subsequent encounter for fracture with routine healing: Secondary | ICD-10-CM | POA: Diagnosis not present

## 2022-07-29 DIAGNOSIS — I77819 Aortic ectasia, unspecified site: Secondary | ICD-10-CM | POA: Diagnosis not present

## 2022-07-29 MED ORDER — IOHEXOL 350 MG/ML SOLN
95.0000 mL | Freq: Once | INTRAVENOUS | Status: AC | PRN
  Administered 2022-07-29: 95 mL via INTRAVENOUS

## 2022-08-01 ENCOUNTER — Other Ambulatory Visit (HOSPITAL_BASED_OUTPATIENT_CLINIC_OR_DEPARTMENT_OTHER): Payer: Self-pay

## 2022-08-01 DIAGNOSIS — I7121 Aneurysm of the ascending aorta, without rupture: Secondary | ICD-10-CM

## 2022-08-03 NOTE — Anesthesia Preprocedure Evaluation (Signed)
Anesthesia Evaluation  Patient identified by MRN, date of birth, ID band Patient awake    Reviewed: Allergy & Precautions, NPO status , Patient's Chart, lab work & pertinent test results  History of Anesthesia Complications Negative for: history of anesthetic complications  Airway Mallampati: I  TM Distance: >3 FB Neck ROM: Full    Dental  (+) Dental Advisory Given   Pulmonary neg shortness of breath, sleep apnea and Continuous Positive Airway Pressure Ventilation , neg COPD, neg recent URI, former smoker   Pulmonary exam normal breath sounds clear to auscultation       Cardiovascular hypertension (furosemide, metoprolol, sacubitril-valsartan, spironolactone), Pt. on medications (-) angina +CHF (EF 35-40%)  (-) Past MI, (-) Cardiac Stents and (-) CABG + dysrhythmias Atrial Fibrillation  Rhythm:Regular Rate:Normal  HLD, thoracic aortic aneurysm  TTE 04/18/2022: IMPRESSIONS   1. Left ventricular ejection fraction, by estimation, is 35 to 40%. The  left ventricle has moderately decreased function. The left ventricle  demonstrates global hypokinesis. There is mild left ventricular  hypertrophy. Left ventricular diastolic  parameters are indeterminate.   2. Right ventricular systolic function is mildly reduced. The right  ventricular size is normal. Tricuspid regurgitation signal is inadequate  for assessing PA pressure.   3. Left atrial size was severely dilated.   4. Right atrial size was mildly dilated.   5. The mitral valve is normal in structure. No evidence of mitral valve  regurgitation. No evidence of mitral stenosis.   6. The aortic valve was not well visualized. Aortic valve regurgitation  is not visualized. No aortic stenosis is present.   7. Aortic dilatation noted. There is dilatation of the ascending aorta,  measuring 42 mm.   8. The inferior vena cava is normal in size with greater than 50%  respiratory  variability, suggesting right atrial pressure of 3 mmHg.     Neuro/Psych neg Seizures PSYCHIATRIC DISORDERS Anxiety     Chronic pain, on methadone    GI/Hepatic negative GI ROS, Neg liver ROS,,,  Endo/Other  negative endocrine ROS    Renal/GU negative Renal ROS     Musculoskeletal   Abdominal   Peds  Hematology negative hematology ROS (+)   Anesthesia Other Findings Last Eliquis: this morning  Reproductive/Obstetrics                             Anesthesia Physical Anesthesia Plan  ASA: 4  Anesthesia Plan: General   Post-op Pain Management: Tylenol PO (pre-op)*   Induction: Intravenous  PONV Risk Score and Plan: 2 and Ondansetron, Dexamethasone and Treatment may vary due to age or medical condition  Airway Management Planned: Oral ETT  Additional Equipment:   Intra-op Plan:   Post-operative Plan: Extubation in OR  Informed Consent: I have reviewed the patients History and Physical, chart, labs and discussed the procedure including the risks, benefits and alternatives for the proposed anesthesia with the patient or authorized representative who has indicated his/her understanding and acceptance.     Dental advisory given  Plan Discussed with: CRNA and Anesthesiologist  Anesthesia Plan Comments: (Risks of general anesthesia discussed including, but not limited to, sore throat, hoarse voice, chipped/damaged teeth, injury to vocal cords, nausea and vomiting, allergic reactions, lung infection, heart attack, stroke, and death. All questions answered. )       Anesthesia Quick Evaluation

## 2022-08-04 ENCOUNTER — Ambulatory Visit (HOSPITAL_BASED_OUTPATIENT_CLINIC_OR_DEPARTMENT_OTHER): Payer: Medicare Other | Admitting: Anesthesiology

## 2022-08-04 ENCOUNTER — Ambulatory Visit (HOSPITAL_COMMUNITY): Payer: Medicare Other | Admitting: Anesthesiology

## 2022-08-04 ENCOUNTER — Other Ambulatory Visit (HOSPITAL_COMMUNITY): Payer: Self-pay

## 2022-08-04 ENCOUNTER — Other Ambulatory Visit: Payer: Self-pay

## 2022-08-04 ENCOUNTER — Ambulatory Visit (HOSPITAL_COMMUNITY)
Admission: RE | Disposition: A | Discharge status: Home / Self Care | Payer: Self-pay | Source: Home / Self Care | Attending: Cardiovascular Disease

## 2022-08-04 ENCOUNTER — Ambulatory Visit (HOSPITAL_COMMUNITY)
Admission: RE | Admit: 2022-08-04 | Discharge: 2022-08-04 | Disposition: A | Discharge status: Home / Self Care | Payer: Medicare Other | Attending: Cardiovascular Disease | Admitting: Cardiovascular Disease

## 2022-08-04 DIAGNOSIS — I5022 Chronic systolic (congestive) heart failure: Secondary | ICD-10-CM | POA: Diagnosis not present

## 2022-08-04 DIAGNOSIS — I4819 Other persistent atrial fibrillation: Secondary | ICD-10-CM | POA: Insufficient documentation

## 2022-08-04 DIAGNOSIS — G4733 Obstructive sleep apnea (adult) (pediatric): Secondary | ICD-10-CM | POA: Diagnosis not present

## 2022-08-04 DIAGNOSIS — F1190 Opioid use, unspecified, uncomplicated: Secondary | ICD-10-CM | POA: Diagnosis not present

## 2022-08-04 DIAGNOSIS — E785 Hyperlipidemia, unspecified: Secondary | ICD-10-CM | POA: Diagnosis not present

## 2022-08-04 DIAGNOSIS — I110 Hypertensive heart disease with heart failure: Secondary | ICD-10-CM | POA: Diagnosis not present

## 2022-08-04 DIAGNOSIS — I480 Paroxysmal atrial fibrillation: Secondary | ICD-10-CM | POA: Diagnosis not present

## 2022-08-04 DIAGNOSIS — I5032 Chronic diastolic (congestive) heart failure: Secondary | ICD-10-CM

## 2022-08-04 DIAGNOSIS — Z7901 Long term (current) use of anticoagulants: Secondary | ICD-10-CM | POA: Insufficient documentation

## 2022-08-04 DIAGNOSIS — I493 Ventricular premature depolarization: Secondary | ICD-10-CM | POA: Insufficient documentation

## 2022-08-04 DIAGNOSIS — Z79899 Other long term (current) drug therapy: Secondary | ICD-10-CM | POA: Insufficient documentation

## 2022-08-04 DIAGNOSIS — D6869 Other thrombophilia: Secondary | ICD-10-CM | POA: Insufficient documentation

## 2022-08-04 HISTORY — PX: ATRIAL FIBRILLATION ABLATION: EP1191

## 2022-08-04 LAB — POCT ACTIVATED CLOTTING TIME
Activated Clotting Time: 287 seconds
Activated Clotting Time: 293 seconds

## 2022-08-04 SURGERY — ATRIAL FIBRILLATION ABLATION
Anesthesia: General

## 2022-08-04 MED ORDER — SODIUM CHLORIDE 0.9 % IV SOLN: INTRAVENOUS | Status: DC

## 2022-08-04 MED ORDER — SUGAMMADEX SODIUM 200 MG/2ML IV SOLN
INTRAVENOUS | Status: DC | PRN
  Administered 2022-08-04: 200 mg via INTRAVENOUS

## 2022-08-04 MED ORDER — HEPARIN SODIUM (PORCINE) 1000 UNIT/ML IJ SOLN
INTRAMUSCULAR | Status: DC | PRN
  Administered 2022-08-04: 14000 [IU] via INTRAVENOUS
  Administered 2022-08-04: 2000 [IU] via INTRAVENOUS

## 2022-08-04 MED ORDER — FENTANYL CITRATE (PF) 250 MCG/5ML IJ SOLN
INTRAMUSCULAR | Status: DC | PRN
  Administered 2022-08-04 (×2): 50 ug via INTRAVENOUS

## 2022-08-04 MED ORDER — DEXAMETHASONE SODIUM PHOSPHATE 10 MG/ML IJ SOLN
INTRAMUSCULAR | Status: DC | PRN
  Administered 2022-08-04: 5 mg via INTRAVENOUS

## 2022-08-04 MED ORDER — ACETAMINOPHEN 325 MG PO TABS: 650.0000 mg | ORAL_TABLET | ORAL | Status: DC | PRN

## 2022-08-04 MED ORDER — DOBUTAMINE INFUSION FOR EP/ECHO/NUC (1000 MCG/ML)
INTRAVENOUS | Status: DC | PRN
  Administered 2022-08-04: 20 ug/kg/min via INTRAVENOUS

## 2022-08-04 MED ORDER — SODIUM CHLORIDE 0.9% FLUSH: 3.0000 mL | INTRAVENOUS | Status: DC | PRN

## 2022-08-04 MED ORDER — SODIUM CHLORIDE 0.9 % IV SOLN: 250.0000 mL | INTRAVENOUS | Status: DC | PRN

## 2022-08-04 MED ORDER — PROTAMINE SULFATE 10 MG/ML IV SOLN
INTRAVENOUS | Status: DC | PRN
  Administered 2022-08-04: 50 mg via INTRAVENOUS

## 2022-08-04 MED ORDER — ONDANSETRON HCL 4 MG/2ML IJ SOLN
INTRAMUSCULAR | Status: DC | PRN
  Administered 2022-08-04: 4 mg via INTRAVENOUS

## 2022-08-04 MED ORDER — HEPARIN (PORCINE) IN NACL 1000-0.9 UT/500ML-% IV SOLN
INTRAVENOUS | Status: DC | PRN
  Administered 2022-08-04 (×4): 500 mL

## 2022-08-04 MED ORDER — ONDANSETRON HCL 4 MG/2ML IJ SOLN: 4.0000 mg | Freq: Four times a day (QID) | INTRAMUSCULAR | Status: DC | PRN

## 2022-08-04 MED ORDER — COLCHICINE 0.6 MG PO TABS
0.6000 mg | ORAL_TABLET | Freq: Two times a day (BID) | ORAL | 0 refills | Status: DC
  Filled 2022-08-04: qty 10, 5d supply, fill #0

## 2022-08-04 MED ORDER — FENTANYL CITRATE (PF) 100 MCG/2ML IJ SOLN
INTRAMUSCULAR | Status: AC
  Filled 2022-08-04: qty 2

## 2022-08-04 MED ORDER — ROCURONIUM BROMIDE 10 MG/ML (PF) SYRINGE
PREFILLED_SYRINGE | INTRAVENOUS | Status: DC | PRN
  Administered 2022-08-04 (×2): 20 mg via INTRAVENOUS
  Administered 2022-08-04: 60 mg via INTRAVENOUS

## 2022-08-04 MED ORDER — LIDOCAINE 2% (20 MG/ML) 5 ML SYRINGE
INTRAMUSCULAR | Status: DC | PRN
  Administered 2022-08-04: 100 mg via INTRAVENOUS

## 2022-08-04 MED ORDER — ACETAMINOPHEN 500 MG PO TABS
1000.0000 mg | ORAL_TABLET | Freq: Once | ORAL | Status: AC
  Administered 2022-08-04: 1000 mg via ORAL
  Filled 2022-08-04: qty 2

## 2022-08-04 MED ORDER — PANTOPRAZOLE SODIUM 40 MG PO TBEC
40.0000 mg | DELAYED_RELEASE_TABLET | Freq: Every day | ORAL | 0 refills | Status: DC
  Filled 2022-08-04: qty 45, 45d supply, fill #0

## 2022-08-04 MED ORDER — HEPARIN SODIUM (PORCINE) 1000 UNIT/ML IJ SOLN
INTRAMUSCULAR | Status: AC
  Filled 2022-08-04: qty 10

## 2022-08-04 MED ORDER — PROPOFOL 10 MG/ML IV BOLUS
INTRAVENOUS | Status: DC | PRN
  Administered 2022-08-04: 170 mg via INTRAVENOUS

## 2022-08-04 MED ORDER — PHENYLEPHRINE HCL-NACL 20-0.9 MG/250ML-% IV SOLN
INTRAVENOUS | Status: DC | PRN
  Administered 2022-08-04 (×3): 160 ug via INTRAVENOUS
  Administered 2022-08-04: 120 ug via INTRAVENOUS
  Administered 2022-08-04: 50 ug/min via INTRAVENOUS

## 2022-08-04 MED ORDER — SODIUM CHLORIDE 0.9% FLUSH: 3.0000 mL | Freq: Two times a day (BID) | INTRAVENOUS | Status: DC

## 2022-08-04 MED ORDER — DOPAMINE-DEXTROSE 3.2-5 MG/ML-% IV SOLN
INTRAVENOUS | Status: AC
  Filled 2022-08-04: qty 250

## 2022-08-04 MED ORDER — HEPARIN SODIUM (PORCINE) 1000 UNIT/ML IJ SOLN
INTRAMUSCULAR | Status: DC | PRN
  Administered 2022-08-04: 1000 [IU] via INTRAVENOUS

## 2022-08-04 SURGICAL SUPPLY — 21 items
BAG SNAP BAND KOVER 36X36 (MISCELLANEOUS) IMPLANT
BLANKET WARM UNDERBOD FULL ACC (MISCELLANEOUS) ×1 IMPLANT
CATH ABLAT QDOT MICRO BI TC FJ (CATHETERS) IMPLANT
CATH OCTARAY 2.0 F 3-3-3-3-3 (CATHETERS) IMPLANT
CATH PIGTAIL STEERABLE D1 8.7 (WIRE) IMPLANT
CATH S-M CIRCA TEMP PROBE (CATHETERS) IMPLANT
CATH SOUNDSTAR ECO 8FR (CATHETERS) IMPLANT
CATH WEBSTER BI DIR CS D-F CRV (CATHETERS) IMPLANT
CLOSURE PERCLOSE PROSTYLE (VASCULAR PRODUCTS) IMPLANT
COVER SWIFTLINK CONNECTOR (BAG) ×1 IMPLANT
DEVICE CLOSURE MYNXGRIP 6/7F (Vascular Products) IMPLANT
MAT PREVALON FULL STRYKER (MISCELLANEOUS) IMPLANT
PACK EP LATEX FREE (CUSTOM PROCEDURE TRAY) ×1
PACK EP LF (CUSTOM PROCEDURE TRAY) ×1 IMPLANT
PAD DEFIB RADIO PHYSIO CONN (PAD) ×1 IMPLANT
PATCH CARTO3 (PAD) IMPLANT
SHEATH CARTO VIZIGO MED CURVE (SHEATH) IMPLANT
SHEATH PINNACLE 8F 10CM (SHEATH) IMPLANT
SHEATH PINNACLE 9F 10CM (SHEATH) IMPLANT
SHEATH PROBE COVER 6X72 (BAG) IMPLANT
TUBING SMART ABLATE COOLFLOW (TUBING) IMPLANT

## 2022-08-04 NOTE — Anesthesia Procedure Notes (Signed)
Procedure Name: Intubation Date/Time: 08/04/2022 7:48 AM  Performed by: Loleta Lois Slagel, CRNAPre-anesthesia Checklist: Patient identified, Patient being monitored, Timeout performed, Emergency Drugs available and Suction available Patient Re-evaluated:Patient Re-evaluated prior to induction Oxygen Delivery Method: Circle system utilized Preoxygenation: Pre-oxygenation with 100% oxygen Induction Type: IV induction Ventilation: Mask ventilation without difficulty Laryngoscope Size: Mac and 4 Grade View: Grade II Tube type: Oral Tube size: 7.5 mm Number of attempts: 1 Airway Equipment and Method: Stylet Placement Confirmation: ETT inserted through vocal cords under direct vision, positive ETCO2 and breath sounds checked- equal and bilateral Secured at: 24 cm Tube secured with: Tape Dental Injury: Teeth and Oropharynx as per pre-operative assessment

## 2022-08-04 NOTE — Transfer of Care (Signed)
Immediate Anesthesia Transfer of Care Note  Patient: Frank Carpenter  Procedure(s) Performed: ATRIAL FIBRILLATION ABLATION  Patient Location: PACU  Anesthesia Type:General  Level of Consciousness: awake  Airway & Oxygen Therapy: Patient Spontanous Breathing  Post-op Assessment: Report given to RN and Post -op Vital signs reviewed and stable  Post vital signs: Reviewed and stable  Last Vitals:  Vitals Value Taken Time  BP 113/74 08/04/22 0945  Temp    Pulse 91 08/04/22 0946  Resp 13 08/04/22 0946  SpO2 93 % 08/04/22 0946  Vitals shown include unfiled device data.  Last Pain:  Vitals:   08/04/22 0628  TempSrc: Temporal  PainSc: 0-No pain         Complications: No notable events documented.

## 2022-08-04 NOTE — H&P (Signed)
Electrophysiology Office Note:    Date:  08/04/2022   ID:  Frank Carpenter, DOB July 02, 1950, MRN [id number]  PCP:  Jarrett Soho, PA-C    [facility] Providers Cardiologist:  Nanetta Batty, MD Electrophysiologist:  Maurice Small, MD     Referring MD: No ref. provider found   History of Present Illness:    Frank Carpenter is a 72 y.o. male with a hx listed below, significant for CHFrEF, OSA, referred for arrhythmia management.  He was diagnosed with atrial fibrillation at the time of an admission for CHF in December 2023.  His EF was newly depressed at 40 to 45%, attributed to rapid ventricular rates.  He was started on Eliquis and metoprolol for rate control.  Cardioversion was ordered, but he was admitted again with acute systolic heart failure prior to eventually undergoing cardioversion on January 24, 2022.  He remained in sinus rhythm for a few weeks, but his Apple Watch detected recurrence of atrial fibrillation in mid February 2024.  Repeat cardioversion was attempted on March 6 but unsuccessful.  He was seen in atrial fibrillation clinic on March 21, symptomatic and rate controlled atrial fibrillation.  He feels very fatigued in atrial fibrillation.   I reviewed the patient's CT and labs. There was no LAA thrombus. he  has not missed any doses of anticoagulation, and he took his dose last night -- and this morning. There have been no changes in the patient's diagnoses, medications, or condition since our recent clinic visit.  He has not tolerated amiodarone and has not been taking it.   EKGs/Labs/Other Studies Reviewed Today:    Echocardiogram:  TTE 04/18/2022  1. Left ventricular ejection fraction, by estimation, is 35 to 40%. The  left ventricle has moderately decreased function. The left ventricle demonstrates global hypokinesis. There is mild left ventricular hypertrophy. Left ventricular diastolic parameters are indeterminate.   2. Right ventricular  systolic function is mildly reduced. The right ventricular size is normal. Tricuspid regurgitation signal is inadequate for assessing PA pressure.   3. Left atrial size was severely dilated.   4. Right atrial size was mildly dilated.   5. The mitral valve is normal in structure. No evidence of mitral valve regurgitation. No evidence of mitral stenosis.   6. The aortic valve was not well visualized. Aortic valve regurgitation is not visualized. No aortic stenosis is present.   7. Aortic dilatation noted. There is dilatation of the ascending aorta,  measuring 42 mm.   8. The inferior vena cava is normal in size with greater than 50%  respiratory variability, suggesting right atrial pressure of 3 mmHg.    Monitors:   Stress testing:   Advanced imaging:    EKG:  Last EKG results: today - AF, frequent PVCs, Qt is about 450   Recent Labs: 01/15/2022: TSH 1.219 01/17/2022: Magnesium 1.9 02/21/2022: ALT 43; B Natriuretic Peptide 605.4 07/22/2022: BUN 17; Creatinine, Ser 0.95; Hemoglobin 17.1; Platelets 222; Potassium 4.5; Sodium 136     Physical Exam:    VS:  BP (!) 135/95   Pulse 72   Temp 100 F (37.8 C) (Temporal)   Resp (!) 24 Comment: Pt nervous'  Ht 5\' 11"  (1.803 m)   Wt 104.3 kg   SpO2 95%   BMI 32.08 kg/m     Wt Readings from Last 3 Encounters:  08/04/22 104.3 kg  05/31/22 102.8 kg  05/02/22 103.9 kg     GEN:  Well nourished, well developed in no  acute distress CARDIAC: RRR, no murmurs, rubs, gallops RESPIRATORY:  Normal work of breathing MUSCULOSKELETAL: no edema    ASSESSMENT & PLAN:    Atrial fibrillation Persistent, symptomatic despite rate control Left atrium is severely dilated (5.2cm) Because he is very symptomatic, I think it appropriate to pursue rhythm control.  He is not a good candidate for most antiarrhythmic drugs due to concurrent methadone use.  The risk of torsades is relatively low with oral amiodarone since it affects IKs more than Ikr.   Will start amiodarone and plan for repeat cardioversion until we can perform ablation. After discussing relative risks and benefits of medical therapy, he agrees that ablation is appropriate strategy for long-term management. Continue apixaban  We discussed the indication, rationale, logistics, anticipated benefits, and potential risks of the ablation procedure including but not limited to -- bleed at the groin access site, chest pain, damage to nearby organs such as the diaphragm, lungs, or esophagus, need for a drainage tube, or prolonged hospitalization. I explained that the risk for stroke, heart attack, need for open chest surgery, or even death is very low but not zero. he  expressed understanding and wishes to proceed.   CHFrEF EF 40 to 45% Continue Entresto 24-26, spironolactone 25, metoprolol, Farxiga 10  OSA Compliance with CPAP encouraged  Chronic methadone use QT is not significantly prolonged  Secondary hypercoagulable state Continue apixaban for CHA2DS2-VASc score of 3  Frequent PVCs          Medication Adjustments/Labs and Tests Ordered: Current medicines are reviewed at length with the patient today.  Concerns regarding medicines are outlined above.  Orders Placed This Encounter  Procedures   Informed Consent Details: Physician/Practitioner Attestation; Transcribe to consent form and obtain patient signature   Initiate Pre-op Protocol   Void on call to EP Lab   Confirm CBC and BMP (or CMP) results within 7 days for inpatient and 30 days for outpatient:   Clip right and left femoral area PM before surgery   Clip right internal jugular area PM before surgery   Pre-admission testing diagnosis   EP STUDY   Insert peripheral IV   Meds ordered this encounter  Medications   0.9 %  sodium chloride infusion   acetaminophen (TYLENOL) tablet 1,000 mg     Signed, Maurice Small, MD  08/04/2022 6:58 AM    [facility]

## 2022-08-04 NOTE — Anesthesia Postprocedure Evaluation (Signed)
Anesthesia Post Note  Patient: Frank Carpenter  Procedure(s) Performed: ATRIAL FIBRILLATION ABLATION     Patient location during evaluation: PACU Anesthesia Type: General Level of consciousness: awake Pain management: pain level controlled Vital Signs Assessment: post-procedure vital signs reviewed and stable Respiratory status: spontaneous breathing, nonlabored ventilation and respiratory function stable Cardiovascular status: blood pressure returned to baseline and stable Postop Assessment: no apparent nausea or vomiting Anesthetic complications: no   No notable events documented.  Last Vitals:  Vitals:   08/04/22 1010 08/04/22 1015  BP: 104/71 108/73  Pulse: 87 87  Resp: 13 17  Temp:  36.8 C  SpO2: 93% 96%    Last Pain:  Vitals:   08/04/22 1027  TempSrc:   PainSc: 0-No pain                 Linton Rump

## 2022-08-05 ENCOUNTER — Encounter (HOSPITAL_COMMUNITY): Payer: Self-pay | Admitting: Cardiovascular Disease

## 2022-08-05 MED FILL — Fentanyl Citrate Preservative Free (PF) Inj 100 MCG/2ML: INTRAMUSCULAR | Qty: 2 | Status: AC

## 2022-08-05 MED FILL — Dopamine in Dextrose 5% Inj 3.2 MG/ML: INTRAVENOUS | Qty: 250 | Status: AC

## 2022-08-10 ENCOUNTER — Telehealth: Payer: Self-pay | Admitting: Cardiovascular Disease

## 2022-08-10 NOTE — Telephone Encounter (Signed)
Spoke with the patient who is curious about coming off of any of his medications since his ablation. Specifically asking about Eliquis. Advised that he would not be able to discontinue Eliquis at this time as there is still potential for some breakthrough AFIB. He will continue with his current medications at this time. Patient verbalized understanding.

## 2022-08-10 NOTE — Telephone Encounter (Signed)
Patient is calling to see if any med changes can be made since his procedure to help with the monthly cost of his medications. Please advise.

## 2022-08-12 ENCOUNTER — Other Ambulatory Visit (HOSPITAL_COMMUNITY): Payer: Medicare Other

## 2022-09-01 ENCOUNTER — Ambulatory Visit (HOSPITAL_COMMUNITY)
Admission: RE | Admit: 2022-09-01 | Discharge: 2022-09-01 | Disposition: A | Discharge status: Home / Self Care | Payer: Medicare Other | Source: Ambulatory Visit | Attending: Physician Assistant | Admitting: Physician Assistant

## 2022-09-01 ENCOUNTER — Encounter (HOSPITAL_COMMUNITY): Payer: Self-pay | Admitting: Physician Assistant

## 2022-09-01 VITALS — BP 132/86 | HR 72 | Ht 71.0 in | Wt 233.8 lb

## 2022-09-01 DIAGNOSIS — I110 Hypertensive heart disease with heart failure: Secondary | ICD-10-CM | POA: Diagnosis not present

## 2022-09-01 DIAGNOSIS — Z7901 Long term (current) use of anticoagulants: Secondary | ICD-10-CM | POA: Insufficient documentation

## 2022-09-01 DIAGNOSIS — I5022 Chronic systolic (congestive) heart failure: Secondary | ICD-10-CM | POA: Insufficient documentation

## 2022-09-01 DIAGNOSIS — I4819 Other persistent atrial fibrillation: Secondary | ICD-10-CM | POA: Diagnosis not present

## 2022-09-01 DIAGNOSIS — D6869 Other thrombophilia: Secondary | ICD-10-CM | POA: Diagnosis not present

## 2022-09-01 DIAGNOSIS — G4733 Obstructive sleep apnea (adult) (pediatric): Secondary | ICD-10-CM | POA: Diagnosis not present

## 2022-09-01 NOTE — Progress Notes (Signed)
Primary Care Physician: Jarrett Soho, PA-C Primary Cardiologist: Dr Allyson Sabal Primary Electrophysiologist: Dr Nelly Laurence  Referring Physician: Gillian Shields NP   Frank Carpenter is a 72 y.o. male with a history of HLD, HTN, chronic systolic CHF, OSA, atrial fibrillation who presents for consultation in the [facility].  Patient was admitted 12/22 - 01/17/2022 and he was started on metoprolol and Eliquis for a CHADS2VASC score of 3.  Echo with LVEF reduced 40-45%, with aortic valve sclerosis but no stenosis.  CT chest 12/2020 with aneurysmal dilation ascending aorta 4.5 cm recommended for semiannual imaging.  At follow-up 01/19/22 he was rate controlled on metoprolol tartrate continue 100 mg twice daily.  He was set up for cardioversion but was admitted 1/29 - 02/25/2022 with acute systolic heart failure treated with IV Lasix.  He underwent cardioversion 02/24/2022.  Saw Edd Fabian, NP in follow-up 03/02/2022 maintaining sinus rhythm.     He contacted the office 03/11/2022 after his Apple Watch had alerted him for atrial fibrillation. He underwent repeat DCCV on 03/30/22 which was unsuccessful. Seen by Dr Nelly Laurence 05/02/22 and started on amiodarone as a bridge to ablation. S/p repeat DCCV on 05/12/22 and afib ablation on 08/04/22.  On follow up today, patient reports that he has done very well since the ablation. He has not had any symptoms of afib or episodes detected on his smart watch. He denies chest pain, swallowing pain, or groin issues.   Today, he denies symptoms of palpitations, chest pain, shortness of breath, orthopnea, PND, lower extremity edema, dizziness, presyncope, syncope, snoring, daytime somnolence, bleeding, or neurologic sequela. The patient is tolerating medications without difficulties and is otherwise without complaint today.    Atrial Fibrillation Risk Factors:  he does have symptoms or diagnosis of sleep apnea. he is compliant with CPAP therapy. he does  not have a history of rheumatic fever.   Atrial Fibrillation Management history:  Previous antiarrhythmic drugs: amiodarone  Previous cardioversions: 02/24/22, 03/30/22 unsuccessful, 05/12/22 Previous ablations: 08/04/22 Anticoagulation history: Eliquis   Past Medical History:  Diagnosis Date   Atrial fibrillation, chronic (HCC)    Chronic systolic (congestive) heart failure (HCC)    High cholesterol    OSA (obstructive sleep apnea)    Testosterone deficiency     Current Outpatient Medications  Medication Sig Dispense Refill   apixaban (ELIQUIS) 5 MG TABS tablet Take 1 tablet (5 mg total) by mouth 2 (two) times daily for 360 doses. 30 tablet 11   dapagliflozin propanediol (FARXIGA) 10 MG TABS tablet Take 1 tablet (10 mg total) by mouth daily. 90 tablet 3   docusate sodium (COLACE) 100 MG capsule Take 400 mg by mouth daily.     furosemide (LASIX) 40 MG tablet TAKE 1 TABLET BY MOUTH EVERY DAY (Patient taking differently: Take 40 mg by mouth daily as needed for fluid or edema.) 90 tablet 1   hydrOXYzine (ATARAX) 25 MG tablet Take 25 mg by mouth every 8 (eight) hours as needed for anxiety.     methadone (DOLOPHINE) 10 MG/ML solution Take 140 mg by mouth daily.     mirtazapine (REMERON) 15 MG tablet Take 15 mg by mouth as needed.     Multiple Vitamin (MULTIVITAMIN) capsule Take 1 capsule by mouth daily.     pantoprazole (PROTONIX) 40 MG tablet Take 1 tablet (40 mg total) by mouth daily. 45 tablet 0   sacubitril-valsartan (ENTRESTO) 24-26 MG Take 1 tablet by mouth 2 (two) times daily. 180 tablet 3  spironolactone (ALDACTONE) 25 MG tablet Take 0.5 tablets (12.5 mg total) by mouth daily. 45 tablet 3   testosterone cypionate (DEPOTESTOSTERONE CYPIONATE) 200 MG/ML injection Inject 100 mg into the muscle once a week. Thursday     colchicine 0.6 MG tablet Take 1 tablet (0.6 mg total) by mouth 2 (two) times daily for 5 days. 10 tablet 0   metoprolol tartrate (LOPRESSOR) 50 MG tablet Take 1 tablet  (50 mg total) by mouth 2 (two) times daily. 180 tablet 3   No current facility-administered medications for this encounter.    ROS- All systems are reviewed and negative except as per the HPI above.  Physical Exam: Vitals:   09/01/22 1003  BP: 132/86  Pulse: 72  Weight: 106.1 kg  Height: 5\' 11"  (1.803 m)     GEN: Well nourished, well developed in no acute distress NECK: No JVD; No carotid bruits CARDIAC: Regular rate and rhythm, no murmurs, rubs, gallops RESPIRATORY:  Clear to auscultation without rales, wheezing or rhonchi  ABDOMEN: Soft, non-tender, non-distended EXTREMITIES:  No edema; No deformity    Wt Readings from Last 3 Encounters:  09/01/22 106.1 kg  08/04/22 104.3 kg  05/31/22 102.8 kg    EKG today demonstrates  SR Vent. rate 72 BPM PR interval 190 ms QRS duration 102 ms QT/QTcB 382/418 ms  Echo 04/18/22 demonstrated   1. Left ventricular ejection fraction, by estimation, is 35 to 40%. The  left ventricle has moderately decreased function. The left ventricle  demonstrates global hypokinesis. There is mild left ventricular  hypertrophy. Left ventricular diastolic parameters are indeterminate.   2. Right ventricular systolic function is mildly reduced. The right  ventricular size is normal. Tricuspid regurgitation signal is inadequate  for assessing PA pressure.   3. Left atrial size was severely dilated.   4. Right atrial size was mildly dilated.   5. The mitral valve is normal in structure. No evidence of mitral valve  regurgitation. No evidence of mitral stenosis.   6. The aortic valve was not well visualized. Aortic valve regurgitation  is not visualized. No aortic stenosis is present.   7. Aortic dilatation noted. There is dilatation of the ascending aorta,  measuring 42 mm.   8. The inferior vena cava is normal in size with greater than 50%  respiratory variability, suggesting right atrial pressure of 3 mmHg.   Epic records are reviewed at length  today  CHA2DS2-VASc Score = 3  The patient's score is based upon: CHF History: 1 HTN History: 1 Diabetes History: 0 Stroke History: 0 Vascular Disease History: 0 (CAC score 0) Age Score: 1 Gender Score: 0       ASSESSMENT AND PLAN: Persistent Atrial Fibrillation (ICD10:  I48.19) The patient's CHA2DS2-VASc score is 3, indicating a 3.2% annual risk of stroke.   AAD options limited by reduced EF and methadone use.  S/p afib ablation 08/04/22. Patient discontinued amiodarone.  Patient appears to be maintaining SR.  Continue Eliquis 5 mg BID with no missed doses for 3 months post ablation.  Continue Lopressor 50 mg BID  Apple Watch for home monitoring   Secondary Hypercoagulable State (ICD10:  D68.69) The patient is at significant risk for stroke/thromboembolism based upon his CHA2DS2-VASc Score of 3.  Continue Apixaban (Eliquis).   Chronic systolic CHF EF 35-40% On GDMT per primary cardiology team Fluid status appears stable today  OSA  Encouraged nightly CPAP  HTN Stable on current regimen   Follow up with Dr Nelly Laurence as scheduled.  Jorja Loa PA-C Afib Clinic [facility] [address] [place], [place] [address] [phone] 09/01/2022 10:16 AM

## 2022-09-04 ENCOUNTER — Other Ambulatory Visit (HOSPITAL_BASED_OUTPATIENT_CLINIC_OR_DEPARTMENT_OTHER): Payer: Self-pay | Admitting: Family

## 2022-09-04 DIAGNOSIS — I480 Paroxysmal atrial fibrillation: Secondary | ICD-10-CM

## 2022-09-04 DIAGNOSIS — D6859 Other primary thrombophilia: Secondary | ICD-10-CM

## 2022-09-05 NOTE — Telephone Encounter (Signed)
Prescription refill request for Eliquis received. Indication: Afib  Last office visit: 09/01/22 Charlean Merl)  Scr: 0.95 (07/22/22)  Age: 72 Weight: 106.1kg  Appropriate dose. Refill sent.

## 2022-09-14 ENCOUNTER — Ambulatory Visit: Payer: Medicare Other | Attending: Cardiovascular Disease | Admitting: Cardiovascular Disease

## 2022-09-14 ENCOUNTER — Encounter: Payer: Self-pay | Admitting: Cardiovascular Disease

## 2022-09-14 VITALS — BP 106/70 | HR 80 | Ht 71.0 in | Wt 229.0 lb

## 2022-09-14 DIAGNOSIS — I519 Heart disease, unspecified: Secondary | ICD-10-CM

## 2022-09-14 DIAGNOSIS — I10 Essential (primary) hypertension: Secondary | ICD-10-CM

## 2022-09-14 DIAGNOSIS — I5023 Acute on chronic systolic (congestive) heart failure: Secondary | ICD-10-CM

## 2022-09-14 DIAGNOSIS — I4891 Unspecified atrial fibrillation: Secondary | ICD-10-CM | POA: Diagnosis not present

## 2022-09-14 DIAGNOSIS — G4733 Obstructive sleep apnea (adult) (pediatric): Secondary | ICD-10-CM

## 2022-09-14 DIAGNOSIS — I7121 Aneurysm of the ascending aorta, without rupture: Secondary | ICD-10-CM

## 2022-09-14 NOTE — Progress Notes (Signed)
09/14/2022 Frank Carpenter   1950/02/18  [id number]  Primary Physician Jarrett Soho, PA-C Primary Cardiologist: Runell Gess MD Nicholes Calamity, [name]  HPI:  Frank Carpenter is a 72 y.o.   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  He was referred by Jarrett Soho, PA-C for evaluation of progressive dyspnea.  I last saw him in the office 05/31/2022.  His cardiovascular risk factor profile is notable for discontinue tobacco abuse having smoked 20 pack years and stopped 5 to 6 years ago.  He has treated hypertension, untreated hyperlipidemia because of statin intolerance.  There is no family history.  Is never had an attack or stroke.  He denies chest pain but complains of increasing dyspnea exertion over the last 5 to 6 months.  He  was admitted over Christmas 2023 with A-fib with RVR and heart failure.  He was anticoagulated and rate controlled.  Since that time he had 3 cardioversions and several admissions for heart failure.  He did have a coronary calcium score performed 06/26/2019 which was 0.  5 7 his 2D echo performed 04/18/2022 revealed EF of 35 to 40% with severe left atrial dilatation.  Interestingly, echo performed 06/26/2019 showed normal LV function with only mild left atrial dilatation.  He has seen Dr. Nelly Laurence in the office in early April who had arranged for him to undergo A-fib ablation.  He underwent successful cardioversion by Dr. Servando Salina on amiodarone 05/12/2022 and he remains in sinus rhythm today with PVCs  He underwent successful A-fib ablation by Dr. Nelly Laurence 08/04/2022.  He has had dramatic clinical improvement.  He has more energy.  He is working out.  He denies chest pain or shortness of breath.  He remains in sinus rhythm on Eliquis.   Current Meds  Medication Sig   apixaban (ELIQUIS) 5 MG TABS tablet Take 1 tablet (5 mg total) by mouth 2 (two) times daily.   dapagliflozin  propanediol (FARXIGA) 10 MG TABS tablet Take 1 tablet (10 mg total) by mouth daily.   docusate sodium (COLACE) 100 MG capsule Take 400 mg by mouth daily.   furosemide (LASIX) 40 MG tablet TAKE 1 TABLET BY MOUTH EVERY DAY (Patient taking differently: Take 40 mg by mouth daily as needed for fluid or edema.)   hydrOXYzine (ATARAX) 25 MG tablet Take 25 mg by mouth every 8 (eight) hours as needed for anxiety.   methadone (DOLOPHINE) 10 MG/ML solution Take 140 mg by mouth daily.   metoprolol tartrate (LOPRESSOR) 50 MG tablet Take 1 tablet (50 mg total) by mouth 2 (two) times daily.   mirtazapine (REMERON) 15 MG tablet Take 15 mg by mouth as needed.   Multiple Vitamin (MULTIVITAMIN) capsule Take 1 capsule by mouth daily.   pantoprazole (PROTONIX) 40 MG tablet Take 1 tablet (40 mg total) by mouth daily.   sacubitril-valsartan (ENTRESTO) 24-26 MG Take 1 tablet by mouth 2 (two) times daily.   spironolactone (ALDACTONE) 25 MG tablet Take 0.5 tablets (12.5 mg total) by mouth daily.   testosterone cypionate (DEPOTESTOSTERONE CYPIONATE) 200 MG/ML injection Inject 100 mg into the muscle once a week. Thursday     Allergies  Allergen Reactions   Ezetimibe     Other Reaction(s): Sick feeling   Statins     Other reaction(s): Other (See Comments) Fatigue    Social History   Socioeconomic History   Marital status: Married  Spouse name: Not on file   Number of children: Not on file   Years of education: Not on file   Highest education level: Not on file  Occupational History   Occupation: retired  Tobacco Use   Smoking status: Former    Current packs/day: 0.00    Average packs/day: 1 pack/day for 15.0 years (15.0 ttl pk-yrs)    Types: Cigarettes    Start date: 59    Quit date: 2004    Years since quitting: 20.6   Smokeless tobacco: Never  Vaping Use   Vaping status: Never Used  Substance and Sexual Activity   Alcohol use: Yes    Alcohol/week: 2.0 standard drinks of alcohol    Types: 2  Cans of beer per week    Comment: rare   Drug use: Not Currently    Types: Marijuana    Comment: hydrocodone prescribed for pain   Sexual activity: Not on file  Other Topics Concern   Not on file  Social History Narrative   Not on file   Social Determinants of Health   Financial Resource Strain: Not on file  Food Insecurity: No Food Insecurity (02/22/2022)   Hunger Vital Sign    Worried About Running Out of Food in the Last Year: Never true    Ran Out of Food in the Last Year: Never true  Transportation Needs: No Transportation Needs (02/22/2022)   PRAPARE - Administrator, Civil Service (Medical): No    Lack of Transportation (Non-Medical): No  Physical Activity: Not on file  Stress: Not on file  Social Connections: Not on file  Intimate Partner Violence: Not At Risk (02/22/2022)   Humiliation, Afraid, Rape, and Kick questionnaire    Fear of Current or Ex-Partner: No    Emotionally Abused: No    Physically Abused: No    Sexually Abused: No     Review of Systems: General: negative for chills, fever, night sweats or weight changes.  Cardiovascular: negative for chest pain, dyspnea on exertion, edema, orthopnea, palpitations, paroxysmal nocturnal dyspnea or shortness of breath Dermatological: negative for rash Respiratory: negative for cough or wheezing Urologic: negative for hematuria Abdominal: negative for nausea, vomiting, diarrhea, bright red blood per rectum, melena, or hematemesis Neurologic: negative for visual changes, syncope, or dizziness All other systems reviewed and are otherwise negative except as noted above.    Blood pressure 106/70, pulse 80, height 5\' 11"  (1.803 m), weight 229 lb (103.9 kg), SpO2 94%.  General appearance: alert and no distress Neck: no adenopathy, no carotid bruit, no JVD, supple, symmetrical, trachea midline, and thyroid not enlarged, symmetric, no tenderness/mass/nodules Lungs: clear to auscultation bilaterally Heart:  regular rate and rhythm, S1, S2 normal, no murmur, click, rub or gallop Extremities: extremities normal, atraumatic, no cyanosis or edema Pulses: 2+ and symmetric Skin: Skin color, texture, turgor normal. No rashes or lesions Neurologic: Grossly normal  EKG not performed today      ASSESSMENT AND PLAN:   Essential hypertension History of essential hypertension blood pressure measured today at 106/70.  He is on Entresto and metoprolol.  Hyperlipidemia History of hyperlipidemia not on statin therapy lipid profile performed 01/15/2022 revealed a total cholesterol of 151, LDL 91 and HDL of 44, at goal for primary prevention.  OSA (obstructive sleep apnea) History of obstructive sleep apnea on CPAP  Thoracic aortic aneurysm [facility]) History of thoracic aortic aneurysm measuring 42 mm by 2D echo 04/18/2022.  This will be checked on annual basis.  Atrial  fibrillation with RVR (HCC) History of symptomatic A-fib status post A-fib ablation by Dr. Nelly Laurence  08/04/2022.  He remains in sinus rhythm on Eliquis.  Acute on chronic systolic (congestive) heart failure (HCC) History of systolic dysfunction with an EF of 35 to 40% by echo 04/18/2022.  He is on GDMT.  Now that he has had ablation.  Will recheck a 2D echo in 3 months.     Runell Gess MD FACP,FACC,FAHA, [facility] 09/14/2022 12:10 PM

## 2022-09-14 NOTE — Assessment & Plan Note (Signed)
History of thoracic aortic aneurysm measuring 42 mm by 2D echo 04/18/2022.  This will be checked on annual basis.

## 2022-09-14 NOTE — Assessment & Plan Note (Signed)
History of hyperlipidemia not on statin therapy lipid profile performed 01/15/2022 revealed a total cholesterol of 151, LDL 91 and HDL of 44, at goal for primary prevention.

## 2022-09-14 NOTE — Assessment & Plan Note (Signed)
History of essential hypertension blood pressure measured today at 106/70.  He is on Entresto and metoprolol.

## 2022-09-14 NOTE — Assessment & Plan Note (Signed)
History of systolic dysfunction with an EF of 35 to 40% by echo 04/18/2022.  He is on GDMT.  Now that he has had ablation.  Will recheck a 2D echo in 3 months.

## 2022-09-14 NOTE — Assessment & Plan Note (Signed)
History of symptomatic A-fib status post A-fib ablation by Dr. Nelly Laurence  08/04/2022.  He remains in sinus rhythm on Eliquis.

## 2022-09-14 NOTE — Assessment & Plan Note (Signed)
History of obstructive sleep apnea on CPAP. 

## 2022-09-14 NOTE — Patient Instructions (Signed)
Medication Instructions:  NO CHANGES  *If you need a refill on your cardiac medications before your next appointment, please call your pharmacy*   Testing/Procedures: Your physician has requested that you have an echocardiogram. Echocardiography is a painless test that uses sound waves to create images of your heart. It provides your doctor with information about the size and shape of your heart and how well your heart's chambers and valves are working. This procedure takes approximately one hour. There are no restrictions for this procedure. Please do NOT wear cologne, perfume, aftershave, or lotions (deodorant is allowed). Please arrive 15 minutes prior to your appointment time. DUE October 2024   Follow-Up: At [facility], you and your health needs are our priority.  As part of our continuing mission to provide you with exceptional heart care, we have created designated Provider Care Teams.  These Care Teams include your primary Cardiologist (physician) and Advanced Practice Providers (APPs -  Physician Assistants and Nurse Practitioners) who all work together to provide you with the care you need, when you need it.  We recommend signing up for the patient portal called "MyChart".  Sign up information is provided on this After Visit Summary.  MyChart is used to connect with patients for Virtual Visits (Telemedicine).  Patients are able to view lab/test results, encounter notes, upcoming appointments, etc.  Non-urgent messages can be sent to your provider as well.   To learn more about what you can do with MyChart, go to [email or web address].    Your next appointment:    6 months with NP or PA  12 months with Dr. Allyson Sabal

## 2022-09-16 ENCOUNTER — Other Ambulatory Visit (HOSPITAL_COMMUNITY): Payer: Medicare Other

## 2022-11-04 ENCOUNTER — Ambulatory Visit: Payer: Medicare Other | Admitting: Cardiovascular Disease

## 2022-11-08 ENCOUNTER — Ambulatory Visit (HOSPITAL_COMMUNITY): Payer: Medicare Other | Attending: Cardiovascular Disease

## 2022-11-08 DIAGNOSIS — I77819 Aortic ectasia, unspecified site: Secondary | ICD-10-CM | POA: Diagnosis not present

## 2022-11-08 DIAGNOSIS — I110 Hypertensive heart disease with heart failure: Secondary | ICD-10-CM | POA: Insufficient documentation

## 2022-11-08 DIAGNOSIS — I519 Heart disease, unspecified: Secondary | ICD-10-CM

## 2022-11-08 DIAGNOSIS — I5022 Chronic systolic (congestive) heart failure: Secondary | ICD-10-CM | POA: Insufficient documentation

## 2022-11-08 DIAGNOSIS — I77810 Thoracic aortic ectasia: Secondary | ICD-10-CM | POA: Diagnosis not present

## 2022-11-08 DIAGNOSIS — I5030 Unspecified diastolic (congestive) heart failure: Secondary | ICD-10-CM | POA: Diagnosis not present

## 2022-11-08 DIAGNOSIS — I4891 Unspecified atrial fibrillation: Secondary | ICD-10-CM | POA: Diagnosis not present

## 2022-11-08 DIAGNOSIS — I517 Cardiomegaly: Secondary | ICD-10-CM

## 2022-11-08 LAB — ECHOCARDIOGRAM COMPLETE
Area-P 1/2: 3.17 cm2
S' Lateral: 3.5 cm

## 2022-11-15 DIAGNOSIS — M25512 Pain in left shoulder: Secondary | ICD-10-CM | POA: Diagnosis not present

## 2022-12-04 ENCOUNTER — Other Ambulatory Visit (HOSPITAL_BASED_OUTPATIENT_CLINIC_OR_DEPARTMENT_OTHER): Payer: Self-pay | Admitting: Cardiovascular Disease

## 2022-12-04 DIAGNOSIS — I5022 Chronic systolic (congestive) heart failure: Secondary | ICD-10-CM

## 2022-12-04 DIAGNOSIS — I480 Paroxysmal atrial fibrillation: Secondary | ICD-10-CM

## 2022-12-04 DIAGNOSIS — D6859 Other primary thrombophilia: Secondary | ICD-10-CM

## 2022-12-04 DIAGNOSIS — I10 Essential (primary) hypertension: Secondary | ICD-10-CM

## 2022-12-05 DIAGNOSIS — M25512 Pain in left shoulder: Secondary | ICD-10-CM | POA: Diagnosis not present

## 2022-12-08 DIAGNOSIS — M25512 Pain in left shoulder: Secondary | ICD-10-CM | POA: Diagnosis not present

## 2022-12-12 ENCOUNTER — Encounter: Payer: Self-pay | Admitting: Cardiovascular Disease

## 2022-12-12 ENCOUNTER — Ambulatory Visit: Payer: Medicare Other | Attending: Cardiovascular Disease | Admitting: Cardiovascular Disease

## 2022-12-12 VITALS — BP 124/76 | HR 85 | Ht 71.0 in | Wt 235.0 lb

## 2022-12-12 DIAGNOSIS — I4891 Unspecified atrial fibrillation: Secondary | ICD-10-CM | POA: Diagnosis not present

## 2022-12-12 NOTE — Patient Instructions (Signed)
Medication Instructions:  Your physician recommends that you continue on your current medications as directed. Please refer to the Current Medication list given to you today. *If you need a refill on your cardiac medications before your next appointment, please call your pharmacy*   Follow-Up: At [facility], you and your health needs are our priority.  As part of our continuing mission to provide you with exceptional heart care, we have created designated Provider Care Teams.  These Care Teams include your primary Cardiologist (physician) and Advanced Practice Providers (APPs -  Physician Assistants and Nurse Practitioners) who all work together to provide you with the care you need, when you need it.  We recommend signing up for the patient portal called "MyChart".  Sign up information is provided on this After Visit Summary.  MyChart is used to connect with patients for Virtual Visits (Telemedicine).  Patients are able to view lab/test results, encounter notes, upcoming appointments, etc.  Non-urgent messages can be sent to your provider as well.   To learn more about what you can do with MyChart, go to [email or web address].    Your next appointment:   July 2025  Provider:   York Pellant, MD

## 2022-12-12 NOTE — Progress Notes (Signed)
Electrophysiology Office Note:    Date:  12/12/2022   ID:  Theodis Shove Carpenter, DOB 1950-02-03, MRN [id number]  PCP:  Jarrett Soho, PA-C   [facility] Providers Cardiologist:  Nanetta Batty, MD Electrophysiologist:  Maurice Small, MD     Referring MD: Jarrett Soho, PA-C   History of Present Illness:    Frank Carpenter is a 72 y.o. male with a hx listed below, significant for CHFrEF, OSA, referred for arrhythmia management.  He was diagnosed with atrial fibrillation at the time of an admission for CHF in December 2023.  His EF was newly depressed at 40 to 45%, attributed to rapid ventricular rates.  He was started on Eliquis and metoprolol for rate control.  Cardioversion was ordered, but he was admitted again with acute systolic heart failure prior to eventually undergoing cardioversion on January 24, 2022.  He remained in sinus rhythm for a few weeks, but his Apple Watch detected recurrence of atrial fibrillation in mid February 2024.  Repeat cardioversion was attempted on March 6 but unsuccessful.  He was seen in atrial fibrillation clinic on March 21, symptomatic and rate controlled atrial fibrillation.  He feels very fatigued in atrial fibrillation.  He underwent atrial fibrillation ablation on August 04, 2022.  He has felt much better since the ablation.  His energy level and shortness of breath have improved significantly.  He does occasionally notice irregular rhythms --he has pulsatile tinnitus.  These appear to correlate with PVCs and is qualitatively different than when he had A-fib.    EKGs/Labs/Other Studies Reviewed Today:    Echocardiogram:  TTE 04/18/2022  1. Left ventricular ejection fraction, by estimation, is 35 to 40%. The  left ventricle has moderately decreased function. The left ventricle demonstrates global hypokinesis. There is mild left ventricular hypertrophy. Left ventricular diastolic parameters are indeterminate.   2. Right ventricular  systolic function is mildly reduced. The right ventricular size is normal. Tricuspid regurgitation signal is inadequate for assessing PA pressure.   3. Left atrial size was severely dilated.   4. Right atrial size was mildly dilated.   5. The mitral valve is normal in structure. No evidence of mitral valve regurgitation. No evidence of mitral stenosis.   6. The aortic valve was not well visualized. Aortic valve regurgitation is not visualized. No aortic stenosis is present.   7. Aortic dilatation noted. There is dilatation of the ascending aorta,  measuring 42 mm.   8. The inferior vena cava is normal in size with greater than 50%  respiratory variability, suggesting right atrial pressure of 3 mmHg.      TTE 11/08/2022 EF 55 to 60%.  Atria are normal in size  Monitors:   Stress testing:   Advanced imaging:    EKG:  Last EKG results: today - AF, frequent PVCs, Qt is about 450   Recent Labs: 01/15/2022: TSH 1.219 01/17/2022: Magnesium 1.9 02/21/2022: ALT 43; B Natriuretic Peptide 605.4 07/22/2022: BUN 17; Creatinine, Ser 0.95; Hemoglobin 17.1; Platelets 222; Potassium 4.5; Sodium 136     Physical Exam:    VS:  BP 124/76   Pulse 85   Ht 5\' 11"  (1.803 m)   Wt 235 lb (106.6 kg)   SpO2 97%   BMI 32.78 kg/m     Wt Readings from Last 3 Encounters:  12/12/22 235 lb (106.6 kg)  09/14/22 229 lb (103.9 kg)  09/01/22 233 lb 12.8 oz (106.1 kg)     GEN:  Well nourished, well  developed in no acute distress CARDIAC: RRR, no murmurs, rubs, gallops RESPIRATORY:  Normal work of breathing MUSCULOSKELETAL: no edema    ASSESSMENT & PLAN:    Atrial fibrillation Persistent, symptomatic despite rate control Maintaining sinus rhythm after ablation Left atrium was severely dilated (5.2cm) but has remodeled to normal dimensions He is not a good candidate for most antiarrhythmic drugs due to concurrent methadone use.   Continue apixaban    CHFrEF EF 40 to 45% Continue Entresto  24-26, spironolactone 25, metoprolol, Farxiga 10  OSA Compliance with CPAP encouraged  Chronic methadone use QT is not significantly prolonged  Secondary hypercoagulable state Continue apixaban for CHA2DS2-VASc score of 3 If he maintains sinus rhythm reliably, we may consider discontinuing anticoagulation in the future Continue monitoring with Apple Watch  Frequent PVCs Minimal symptoms, normal EF No strong indication for intervention at this time Continue metoprolol          Medication Adjustments/Labs and Tests Ordered: Current medicines are reviewed at length with the patient today.  Concerns regarding medicines are outlined above.  Orders Placed This Encounter  Procedures   EKG 12-Lead   No orders of the defined types were placed in this encounter.    Signed, Maurice Small, MD  12/12/2022 4:18 PM    [facility]

## 2022-12-13 DIAGNOSIS — M25512 Pain in left shoulder: Secondary | ICD-10-CM | POA: Diagnosis not present

## 2022-12-13 DIAGNOSIS — M25812 Other specified joint disorders, left shoulder: Secondary | ICD-10-CM | POA: Diagnosis not present

## 2022-12-14 ENCOUNTER — Telehealth: Payer: Self-pay | Admitting: *Deleted

## 2022-12-14 NOTE — Telephone Encounter (Signed)
   Pre-operative Risk Assessment    Patient Name: Frank Carpenter  DOB: 11-23-1950 MRN: [id number]  DATE OF LAST VISIT: 12/12/22 DR. MEALOR DATE OF NEXT VISIT: 03/14/23 Frank Reining, DNP    Request for Surgical Clearance    Procedure:   LEFT REVERSE TOTAL SHOULDER ARTHROPLASTY  Date of Surgery:  Clearance TBD                                 Surgeon:  DR. Malon Kindle Surgeon's Group or Practice Name:  [facility] Phone number:  [phone] KERRI MAZE Fax number:  [phone]   Type of Clearance Requested:   - Medical  - Pharmacy:  Hold Apixaban (Eliquis)     Type of Anesthesia:   CHOICE   Additional requests/questions:    [name]   12/14/2022, 4:16 PM

## 2022-12-15 NOTE — Telephone Encounter (Signed)
Patient with diagnosis of Afib on apixaban (Eliquis) 5 mg PO BID for anticoagulation.    Procedure: L reverse total shoulder arthoplasty Date of procedure: TBD   CHA2DS2-VASc Score = 3   This indicates a 3.2% annual risk of stroke. The patient's score is based upon: CHF History: 1 HTN History: 1 Diabetes History: 0 Stroke History: 0 Vascular Disease History: 0 (CAC score 0) Age Score: 1 Gender Score: 0      CrCl 106 mL/min (ABW, Scr 0.95 06/2022) Platelet count 222  Of note, patient had ablation 08/04/22 (>90d prior to planned procedure).   Per office protocol, patient can hold apixaban (Eliquis) for 3 days prior to procedure.  Patient will not need bridging with Lovenox (enoxaparin) around procedure.  **This guidance is not considered finalized until pre-operative APP has relayed final recommendations.**  Nils Pyle, PharmD PGY1 Pharmacy Resident

## 2022-12-19 NOTE — Telephone Encounter (Signed)
   Name: Frank Carpenter  DOB: [birth year]  MRN: [id number]  Primary Cardiologist: Nanetta Batty, MD  Chart reviewed as part of pre-operative protocol coverage. Because of Frank Carpenter's past medical history and time since last visit, he will require a follow-up in-office visit in order to better assess preoperative cardiovascular risk.  Pre-op covering staff: - Please schedule appointment and call patient to inform them. If patient already had an upcoming appointment within acceptable timeframe, please add "pre-op clearance" to the appointment notes so provider is aware. - Please contact requesting surgeon's office via preferred method (i.e, phone, fax) to inform them of need for appointment prior to surgery.   Per office protocol, patient can hold apixaban (Eliquis) for 3 days prior to procedure.  Patient will not need bridging with Lovenox (enoxaparin) around procedure.    Sharlene Dory, PA-C  12/19/2022, 11:27 AM

## 2022-12-19 NOTE — Telephone Encounter (Signed)
DPR ok to s/w the pt's wife. Pt has been scheduled in office appt 12/2//24 with Bernadene Person, NP at [facility] location.   I will update all parties involved.

## 2022-12-26 ENCOUNTER — Encounter: Payer: Self-pay | Admitting: Nurse Practitioner

## 2022-12-26 ENCOUNTER — Ambulatory Visit: Payer: Medicare Other | Attending: Nurse Practitioner | Admitting: Nurse Practitioner

## 2022-12-26 VITALS — BP 108/62 | HR 76 | Ht 72.0 in | Wt 233.0 lb

## 2022-12-26 DIAGNOSIS — I10 Essential (primary) hypertension: Secondary | ICD-10-CM | POA: Diagnosis not present

## 2022-12-26 DIAGNOSIS — Z01810 Encounter for preprocedural cardiovascular examination: Secondary | ICD-10-CM | POA: Diagnosis not present

## 2022-12-26 DIAGNOSIS — I5022 Chronic systolic (congestive) heart failure: Secondary | ICD-10-CM | POA: Diagnosis not present

## 2022-12-26 DIAGNOSIS — I2510 Atherosclerotic heart disease of native coronary artery without angina pectoris: Secondary | ICD-10-CM | POA: Diagnosis not present

## 2022-12-26 DIAGNOSIS — E785 Hyperlipidemia, unspecified: Secondary | ICD-10-CM | POA: Diagnosis not present

## 2022-12-26 DIAGNOSIS — I77810 Thoracic aortic ectasia: Secondary | ICD-10-CM | POA: Diagnosis not present

## 2022-12-26 DIAGNOSIS — I480 Paroxysmal atrial fibrillation: Secondary | ICD-10-CM | POA: Diagnosis not present

## 2022-12-26 NOTE — Patient Instructions (Addendum)
Medication Instructions:  Your physician recommends that you continue on your current medications as directed. Please refer to the Current Medication list given to you today.  *If you need a refill on your cardiac medications before your next appointment, please call your pharmacy*   Lab Work: NONE ordered at this time of appointment    Testing/Procedures: NONE ordered at this time of appointment     Follow-Up: At [facility], you and your health needs are our priority.  As part of our continuing mission to provide you with exceptional heart care, we have created designated Provider Care Teams.  These Care Teams include your primary Cardiologist (physician) and Advanced Practice Providers (APPs -  Physician Assistants and Nurse Practitioners) who all work together to provide you with the care you need, when you need it.  We recommend signing up for the patient portal called "MyChart".  Sign up information is provided on this After Visit Summary.  MyChart is used to connect with patients for Virtual Visits (Telemedicine).  Patients are able to view lab/test results, encounter notes, upcoming appointments, etc.  Non-urgent messages can be sent to your provider as well.   To learn more about what you can do with MyChart, go to [email or web address].    Your next appointment:    Keep follow up 02/2023  Provider:   Joni Reining NP   Other Instructions

## 2022-12-26 NOTE — Progress Notes (Signed)
Office Visit    Patient Name: Frank Carpenter Date of Encounter: 12/26/2022  Primary Care Provider:  Jarrett Soho, PA-C Primary Cardiologist:  Nanetta Batty, MD  Chief Complaint    72 year old male with a history of coronary artery calcification, chronic systolic heart failure with subsequent normalization of EF, paroxysmal atrial fibrillation, dilation of the ascending aorta, hypertension, hyperlipidemia, and OSA who presents for follow-up related to atrial fibrillation, heart failure and for preoperative cardiac evaluation.  Past Medical History    Past Medical History:  Diagnosis Date   Atrial fibrillation, chronic (HCC)    Chronic systolic (congestive) heart failure (HCC)    High cholesterol    OSA (obstructive sleep apnea)    Testosterone deficiency    Past Surgical History:  Procedure Laterality Date   ATRIAL FIBRILLATION ABLATION N/A 08/04/2022   Procedure: ATRIAL FIBRILLATION ABLATION;  Surgeon: Maurice Small, MD;  Location: [facility] INVASIVE CV LAB;  Service: Cardiovascular;  Laterality: N/A;   CARDIOVERSION N/A 02/24/2022   Procedure: CARDIOVERSION;  Surgeon: Wendall Stade, MD;  Location: [facility] ENDOSCOPY;  Service: Cardiovascular;  Laterality: N/A;   CARDIOVERSION N/A 03/30/2022   Procedure: CARDIOVERSION;  Surgeon: Meriam Sprague, MD;  Location: [facility] ENDOSCOPY;  Service: Cardiovascular;  Laterality: N/A;   CARDIOVERSION N/A 05/12/2022   Procedure: CARDIOVERSION;  Surgeon: Thomasene Ripple, DO;  Location: [facility] INVASIVE CV LAB;  Service: Cardiovascular;  Laterality: N/A;   NASAL SEPTUM SURGERY  04/2021    Allergies  Allergies  Allergen Reactions   Ezetimibe     Other Reaction(s): Sick feeling   Statins     Other reaction(s): Other (See Comments) Fatigue     Labs/Other Studies Reviewed    The following studies were reviewed today:  Cardiac Studies & Procedures       ECHOCARDIOGRAM  ECHOCARDIOGRAM COMPLETE 11/08/2022  Narrative ECHOCARDIOGRAM  REPORT    Patient Name:   Frank Carpenter Date of Exam: 11/08/2022 Medical Rec #:  [id number]       Height:       71.0 in Accession #:    [id number]      Weight:       229.0 lb Date of Birth:  [birth year]       BSA:          2.233 m Patient Age:    71 years        BP:           106/70 mmHg Patient Gender: M               HR:           88 bpm. Exam Location:  [address]  Procedure: 2D Echo, Cardiac Doppler and Color Doppler  Indications:    I51.9 LV dysfunction  History:        Patient has prior history of Echocardiogram examinations, most recent 04/18/2022. LV dysfunction, Dilated ascending aorta, Arrythmias:Atrial Fibrillation; Risk Factors:Hypertension, Obesity and Former Smoker.  Sonographer:    Samule Ohm RDCS Referring Phys: [phone] JONATHAN J BERRY   Sonographer Comments: Technically difficult study due to poor echo windows and patient is obese. Image acquisition challenging due to patient body habitus and Image acquisition challenging due to respiratory motion. IMPRESSIONS   1. Left ventricular ejection fraction, by estimation, is 55 to 60%. Left ventricular ejection fraction by PLAX is 57 %. The left ventricle has normal function. The left ventricle has no regional wall motion abnormalities. There is mild concentric left ventricular hypertrophy.  Left ventricular diastolic parameters are consistent with Grade I diastolic dysfunction (impaired relaxation). 2. Right ventricular systolic function is normal. The right ventricular size is normal. There is normal pulmonary artery systolic pressure. 3. The mitral valve is normal in structure. No evidence of mitral valve regurgitation. No evidence of mitral stenosis. 4. The aortic valve is tricuspid. Aortic valve regurgitation is not visualized. No aortic stenosis is present. 5. Aortic dilatation noted. There is mild dilatation of the ascending aorta, measuring 42 mm. 6. The inferior vena cava is normal in size with greater than  50% respiratory variability, suggesting right atrial pressure of 3 mmHg.  FINDINGS Left Ventricle: Left ventricular ejection fraction, by estimation, is 55 to 60%. Left ventricular ejection fraction by PLAX is 57 %. The left ventricle has normal function. The left ventricle has no regional wall motion abnormalities. The left ventricular internal cavity size was normal in size. There is mild concentric left ventricular hypertrophy. Left ventricular diastolic parameters are consistent with Grade I diastolic dysfunction (impaired relaxation). Indeterminate filling pressures.  Right Ventricle: The right ventricular size is normal. No increase in right ventricular wall thickness. Right ventricular systolic function is normal. There is normal pulmonary artery systolic pressure. The tricuspid regurgitant velocity is 2.39 m/s, and with an assumed right atrial pressure of 3 mmHg, the estimated right ventricular systolic pressure is 25.8 mmHg.  Left Atrium: Left atrial size was normal in size.  Right Atrium: Right atrial size was normal in size.  Pericardium: There is no evidence of pericardial effusion.  Mitral Valve: The mitral valve is normal in structure. No evidence of mitral valve regurgitation. No evidence of mitral valve stenosis.  Tricuspid Valve: The tricuspid valve is normal in structure. Tricuspid valve regurgitation is trivial. No evidence of tricuspid stenosis.  Aortic Valve: The aortic valve is tricuspid. Aortic valve regurgitation is not visualized. No aortic stenosis is present.  Pulmonic Valve: The pulmonic valve was normal in structure. Pulmonic valve regurgitation is trivial. No evidence of pulmonic stenosis.  Aorta: Aortic dilatation noted. There is mild dilatation of the ascending aorta, measuring 42 mm.  Venous: The inferior vena cava is normal in size with greater than 50% respiratory variability, suggesting right atrial pressure of 3 mmHg.  IAS/Shunts: No atrial level shunt  detected by color flow Doppler.   LEFT VENTRICLE PLAX 2D LV EF:         Left            Diastology ventricular     LV e' medial:    7.51 cm/s ejection        LV E/e' medial:  10.2 fraction by     LV e' lateral:   9.03 cm/s PLAX is 57      LV E/e' lateral: 8.5 %. LVIDd:         5.00 cm LVIDs:         3.50 cm LV PW:         1.30 cm LV IVS:        1.30 cm LVOT diam:     2.20 cm LV SV:         77 LV SV Index:   35 LVOT Area:     3.80 cm   RIGHT VENTRICLE             IVC RV S prime:     16.30 cm/s  IVC diam: 1.40 cm TAPSE (M-mode): 2.3 cm RVSP:           25.8  mmHg  LEFT ATRIUM             Index        RIGHT ATRIUM           Index LA diam:        4.90 cm 2.19 cm/m   RA Pressure: 3.00 mmHg LA Vol (A2C):   63.2 ml 28.30 ml/m  RA Area:     17.20 cm LA Vol (A4C):   64.5 ml 28.88 ml/m  RA Volume:   51.30 ml  22.97 ml/m LA Biplane Vol: 65.0 ml 29.10 ml/m AORTIC VALVE LVOT Vmax:   94.20 cm/s LVOT Vmean:  64.500 cm/s LVOT VTI:    0.203 m  AORTA Ao Root diam: 4.10 cm Ao Asc diam:  4.20 cm  MITRAL VALVE               TRICUSPID VALVE MV Area (PHT): 3.17 cm    TR Peak grad:   22.8 mmHg MV Decel Time: 239 msec    TR Vmax:        239.00 cm/s MV E velocity: 76.50 cm/s  Estimated RAP:  3.00 mmHg MV A velocity: 89.10 cm/s  RVSP:           25.8 mmHg MV E/A ratio:  0.86 SHUNTS Systemic VTI:  0.20 m Systemic Diam: 2.20 cm  Chilton Si MD Electronically signed by Chilton Si MD Signature Date/Time: 11/08/2022/3:24:59 PM    Final     CT SCANS  CT CARDIAC SCORING (SELF PAY ONLY) 06/26/2019  Addendum 06/26/2019  5:12 PM ADDENDUM REPORT: 06/26/2019 17:10  CLINICAL DATA:  Risk stratification  EXAM: Coronary Calcium Score  TECHNIQUE: The patient was scanned on a Siemens Somatom 64 slice scanner. Axial non-contrast 3 mm slices were carried out through the heart. The data set was analyzed on a dedicated work station and scored using the Agatson  method.  FINDINGS: Non-cardiac: See separate report from [facility].  Ascending aorta: Mild aortic root dilatation 4.1 cm  Pericardium: Normal  Coronary arteries: No coronary calcium noted  IMPRESSION: Coronary calcium score of 0.  Suggest f/u CTA of aorta in 6 months  Charlton Haws   Electronically Signed By: Charlton Haws M.D. On: 06/26/2019 17:10  Narrative EXAM: OVER-READ INTERPRETATION  CT CHEST  The following report is an over-read performed by radiologist Dr. Trudie Reed of [facility] on 06/26/2019. This over-read does not include interpretation of cardiac or coronary anatomy or pathology. The coronary calcium score interpretation by the cardiologist is attached.  COMPARISON:  None.  FINDINGS: Within the visualized portions of the thorax there are no suspicious appearing pulmonary nodules or masses, there is no acute consolidative airspace disease, no pleural effusions, no pneumothorax and no lymphadenopathy. Visualized portions of the upper abdomen are unremarkable. There are no aggressive appearing lytic or blastic lesions noted in the visualized portions of the skeleton.  IMPRESSION: 1. No significant incidental noncardiac findings are noted.  Electronically Signed: By: Trudie Reed M.D. On: 06/26/2019 13:12         Recent Labs: 01/15/2022: TSH 1.219 01/17/2022: Magnesium 1.9 02/21/2022: ALT 43; B Natriuretic Peptide 605.4 07/22/2022: BUN 17; Creatinine, Ser 0.95; Hemoglobin 17.1; Platelets 222; Potassium 4.5; Sodium 136  Recent Lipid Panel    Component Value Date/Time   CHOL 151 01/15/2022 1847   TRIG 81 01/15/2022 1847   HDL 44 01/15/2022 1847   CHOLHDL 3.4 01/15/2022 1847   VLDL 16 01/15/2022 1847   LDLCALC 91 01/15/2022 1847  History of Present Illness    72 year old male with the above past medical history including coronary artery calcification, chronic systolic heart failure with subsequent  normalization of EF, paroxysmal atrial fibrillation, dilation of the ascending aorta, hypertension, hyperlipidemia, and OSA.  Coronary calcium score in 2021 was 0.  Echocardiogram in 2021 showed normal LV function.  He has a history of persistent atrial fibrillation s/p multiple DCCVs, most recently in 04/2022.  He was hospitalized in December 2023 in the setting of atrial fibrillation with RVR, acute systolic heart failure.  Echocardiogram in 03/2022 revealed EF 35 to 40%, severe left atrial dilation.  Cardiac CT in 07/2022 revealed mild dilation of ascending aorta measuring 41 to 43 mm, no LAA thrombus, mild coronary artery calcifications.  He underwent atrial fibrillation ablation in 07/2022.  Repeat echocardiogram in 11/2022 showed EF 55 to 60%, normal LV function, no RWMA, mild concentric LVH, G1 DD, no significant valvular abnormalities, mild dilation of the ascending aorta measuring 42 mm.  He was last seen in the office on 12/12/2022 by Dr. Nelly Laurence and was doing well.  He was maintaining sinus rhythm.  He presents today for follow-up for preoperative cardiac evaluation for upcoming left reverse total shoulder arthroplasty with Dr. Emmit Pomfret of [facility] with request to hold Eliquis prior to surgery.  Since his last visit he has done well from a cardiac standpoint.  He denies any palpitations, denies symptoms concerning for angina.  Overall, he reports feeling well.    Home Medications    Current Outpatient Medications  Medication Sig Dispense Refill   apixaban (ELIQUIS) 5 MG TABS tablet Take 1 tablet (5 mg total) by mouth 2 (two) times daily. 60 tablet 5   dapagliflozin propanediol (FARXIGA) 10 MG TABS tablet Take 1 tablet (10 mg total) by mouth daily. 90 tablet 3   docusate sodium (COLACE) 100 MG capsule Take 400 mg by mouth daily.     furosemide (LASIX) 40 MG tablet TAKE 1 TABLET BY MOUTH EVERY DAY 90 tablet 1   hydrOXYzine (ATARAX) 25 MG tablet Take 25 mg by mouth every 8 (eight) hours as  needed for anxiety.     methadone (DOLOPHINE) 10 MG/ML solution Take 140 mg by mouth daily.     mirtazapine (REMERON) 15 MG tablet Take 15 mg by mouth as needed.     Multiple Vitamin (MULTIVITAMIN) capsule Take 1 capsule by mouth daily.     sacubitril-valsartan (ENTRESTO) 24-26 MG Take 1 tablet by mouth 2 (two) times daily. 180 tablet 3   spironolactone (ALDACTONE) 25 MG tablet Take 0.5 tablets (12.5 mg total) by mouth daily. 45 tablet 3   testosterone cypionate (DEPOTESTOSTERONE CYPIONATE) 200 MG/ML injection Inject 100 mg into the muscle once a week. Thursday     metoprolol tartrate (LOPRESSOR) 50 MG tablet Take 1 tablet (50 mg total) by mouth 2 (two) times daily. 180 tablet 3   No current facility-administered medications for this visit.     Review of Systems    He denies chest pain, palpitations, dyspnea, pnd, orthopnea, n, v, dizziness, syncope, edema, weight gain, or early satiety. All other systems reviewed and are otherwise negative except as noted above.    Physical Exam    VS:  BP 108/62 (BP Location: Left Arm, Patient Position: Sitting, Cuff Size: Normal)   Pulse 76   Ht 6' (1.829 m)   Wt 233 lb (105.7 kg)   SpO2 96%   BMI 31.60 kg/m  GEN: Well nourished, well developed, in no acute  distress. HEENT: normal. Neck: Supple, no JVD, carotid bruits, or masses. Cardiac: RRR, no murmurs, rubs, or gallops. No clubbing, cyanosis, edema.  Radials/DP/PT 2+ and equal bilaterally.  Respiratory:  Respirations regular and unlabored, clear to auscultation bilaterally. GI: Soft, nontender, nondistended, BS + x 4. MS: no deformity or atrophy. Skin: warm and dry, no rash. Neuro:  Strength and sensation are intact. Psych: Normal affect.  Accessory Clinical Findings    ECG personally reviewed by me today - EKG Interpretation Date/Time:  Monday December 26 2022 14:05:52 EST Ventricular Rate:  76 PR Interval:  184 QRS Duration:  98 QT Interval:  372 QTC Calculation: 418 R  Axis:   62  Text Interpretation: Sinus rhythm with frequent Premature ventricular complexes When compared with ECG of 12-Dec-2022 15:56, No significant change was found Confirmed by Bernadene Person ([id number]) on 12/26/2022 2:17:15 PM  - no acute changes.   Lab Results  Component Value Date   WBC 8.5 07/22/2022   HGB 17.1 07/22/2022   HCT 50.9 07/22/2022   MCV 96 07/22/2022   PLT 222 07/22/2022   Lab Results  Component Value Date   CREATININE 0.95 07/22/2022   BUN 17 07/22/2022   NA 136 07/22/2022   K 4.5 07/22/2022   CL 98 07/22/2022   CO2 25 07/22/2022   Lab Results  Component Value Date   ALT 43 02/21/2022   AST 25 02/21/2022   ALKPHOS 86 02/21/2022   BILITOT 1.0 02/21/2022   Lab Results  Component Value Date   CHOL 151 01/15/2022   HDL 44 01/15/2022   LDLCALC 91 01/15/2022   TRIG 81 01/15/2022   CHOLHDL 3.4 01/15/2022    No results found for: "HGBA1C"  Assessment & Plan    1. Coronary artery calcification: Mild coronary artery calcification noted on prior CT. Stable with no anginal symptoms. No indication for ischemic evaluation.  Statin intolerant.  2. Chronic systolic heart failure: Prior EF 35-40%. Repeat echocardiogram in 11/2022 showed EF 55 to 60%, normal LV function, no RWMA, mild concentric LVH, G1 DD, no significant valvular abnormalities, mild dilation of the ascending aorta measuring 42 mm. Euvolemic and well compensated on exam.  Continue metoprolol, Entresto, spironolactone, Farxiga, and Lasix.  2. Paroxysmal atrial fibrillation/PVCs: S/p ablation in 07/2022. EKG today shows sinus rhythm and frequent PVCs. Following with EP. Continue metoprolol, Eliquis.   3. Mild dilation of ascending aorta: Measured 42 mm on echo in 11/2022. Plan for repeat echo vs CT chest/aorta for routine monitoring in 11/2022.   4. Hypertension: BP well controlled. Continue current antihypertensive regimen.   5. Hyperlipidemia: LDL was 91 in 12/2021. Statin intolerant.   6. OSA:  Adherent to CPAP.    7. Preoperative cardiac exam: According to the Revised Cardiac Risk Index (RCRI), his Perioperative Risk of Major Cardiac Event is (%): 0.9. His Functional Capacity in METs is: 8.97 according to the [employer] Activity Status Index (DASI). Therefore, based on ACC/AHA guidelines, patient would be at acceptable risk for the planned procedure without further cardiovascular testing. I will route this recommendation to the requesting party via Epic fax function. Per office protocol, patient can hold apixaban (Eliquis) for 3 days prior to procedure. Patient will not need bridging with Lovenox (enoxaparin) around procedure. Please resume Eliquis as soon as possible postprocedure, at the discretion of the surgeon.   8. Disposition: Follow-up as scheduled in 02/2022.       Joylene Grapes, NP 12/26/2022, 2:37 PM

## 2022-12-29 DIAGNOSIS — M25512 Pain in left shoulder: Secondary | ICD-10-CM | POA: Diagnosis not present

## 2023-02-06 ENCOUNTER — Other Ambulatory Visit (HOSPITAL_BASED_OUTPATIENT_CLINIC_OR_DEPARTMENT_OTHER): Payer: Self-pay | Admitting: Cardiovascular Disease

## 2023-02-06 DIAGNOSIS — I480 Paroxysmal atrial fibrillation: Secondary | ICD-10-CM

## 2023-02-06 NOTE — Telephone Encounter (Signed)
 Prescription refill request for Eliquis received. Indication: a fib Last office visit: 12/26/22 Scr: 0.95 epic 07/22/22 Age: 73 Weight: 105kg

## 2023-02-21 ENCOUNTER — Other Ambulatory Visit: Payer: Self-pay | Admitting: Cardiovascular Disease

## 2023-02-25 ENCOUNTER — Other Ambulatory Visit: Payer: Self-pay | Admitting: Cardiovascular Disease

## 2023-02-28 ENCOUNTER — Telehealth: Payer: Self-pay | Admitting: Pharmacist

## 2023-02-28 ENCOUNTER — Other Ambulatory Visit: Payer: Self-pay | Admitting: Cardiovascular Disease

## 2023-02-28 NOTE — H&P (Cosign Needed)
Patient's anticipated LOS is less than 2 midnights, meeting these requirements: - Younger than 57 - Lives within 1 hour of care - Has a competent adult at home to recover with post-op recover - NO history of  - Chronic pain requiring opiods  - Diabetes  - Coronary Artery Disease  - Heart failure  - Heart attack  - Stroke  - DVT/VTE  - Cardiac arrhythmia  - Respiratory Failure/COPD  - Renal failure  - Anemia  - Advanced Liver disease     Frank Carpenter is an 73 y.o. male.    Chief Complaint: left shoulder pain  HPI: Pt is a 73 y.o. male complaining of left shoulder pain for multiple years. Pain had continually increased since the beginning. X-rays in the clinic show end-stage arthritic changes of the left shoulder. Pt has tried various conservative treatments which have failed to alleviate their symptoms, including injections and therapy. Various options are discussed with the patient. Risks, benefits and expectations were discussed with the patient. Patient understand the risks, benefits and expectations and wishes to proceed with surgery.   PCP:  Jarrett Soho, PA-C  D/C Plans: Home  PMH: Past Medical History:  Diagnosis Date   Atrial fibrillation, chronic (HCC)    Chronic systolic (congestive) heart failure (HCC)    High cholesterol    OSA (obstructive sleep apnea)    Testosterone deficiency     PSH: Past Surgical History:  Procedure Laterality Date   ATRIAL FIBRILLATION ABLATION N/A 08/04/2022   Procedure: ATRIAL FIBRILLATION ABLATION;  Surgeon: Maurice Small, MD;  Location: [facility] INVASIVE CV LAB;  Service: Cardiovascular;  Laterality: N/A;   CARDIOVERSION N/A 02/24/2022   Procedure: CARDIOVERSION;  Surgeon: Wendall Stade, MD;  Location: [facility] ENDOSCOPY;  Service: Cardiovascular;  Laterality: N/A;   CARDIOVERSION N/A 03/30/2022   Procedure: CARDIOVERSION;  Surgeon: Meriam Sprague, MD;  Location: [facility] ENDOSCOPY;  Service: Cardiovascular;  Laterality: N/A;    CARDIOVERSION N/A 05/12/2022   Procedure: CARDIOVERSION;  Surgeon: Thomasene Ripple, DO;  Location: [facility] INVASIVE CV LAB;  Service: Cardiovascular;  Laterality: N/A;   NASAL SEPTUM SURGERY  04/2021    Social History:  reports that he quit smoking about 21 years ago. His smoking use included cigarettes. He started smoking about 36 years ago. He has a 15 pack-year smoking history. He has never used smokeless tobacco. He reports current alcohol use of about 2.0 standard drinks of alcohol per week. He reports that he does not currently use drugs after having used the following drugs: Marijuana. BMI: Estimated body mass index is 31.6 kg/m as calculated from the following:   Height as of 12/26/22: 6' (1.829 m).   Weight as of 12/26/22: 105.7 kg.  Lab Results  Component Value Date   ALBUMIN 3.9 02/21/2022   Diabetes: Patient does not have a diagnosis of diabetes.     Smoking Status:      Allergies:  Allergies  Allergen Reactions   Ezetimibe     Other Reaction(s): Sick feeling   Statins     Other reaction(s): Other (See Comments) Fatigue    Medications: No current facility-administered medications for this encounter.   Current Outpatient Medications  Medication Sig Dispense Refill   dapagliflozin propanediol (FARXIGA) 10 MG TABS tablet Take 1 tablet (10 mg total) by mouth daily. 90 tablet 3   docusate sodium (COLACE) 100 MG capsule Take 400 mg by mouth daily.     ELIQUIS 5 MG TABS tablet TAKE 1 TABLET BY MOUTH  TWICE A DAY 60 tablet 5   ENTRESTO 24-26 MG TAKE 1 TABLET BY MOUTH TWICE A DAY 60 tablet 11   furosemide (LASIX) 40 MG tablet TAKE 1 TABLET BY MOUTH EVERY DAY 90 tablet 1   hydrOXYzine (ATARAX) 25 MG tablet Take 25 mg by mouth every 8 (eight) hours as needed for anxiety.     methadone (DOLOPHINE) 10 MG/ML solution Take 140 mg by mouth daily.     metoprolol tartrate (LOPRESSOR) 50 MG tablet TAKE 1 TABLET BY MOUTH TWICE A DAY 180 tablet 3   mirtazapine (REMERON) 15 MG tablet Take 15  mg by mouth as needed.     Multiple Vitamin (MULTIVITAMIN) capsule Take 1 capsule by mouth daily.     spironolactone (ALDACTONE) 25 MG tablet Take 0.5 tablets (12.5 mg total) by mouth daily. 45 tablet 3   testosterone cypionate (DEPOTESTOSTERONE CYPIONATE) 200 MG/ML injection Inject 100 mg into the muscle once a week. Thursday      No results found for this or any previous visit (from the past 48 hours). No results found.  ROS: Pain with rom of the left upper extremity  Physical Exam: Alert and oriented 73 y.o. male in no acute distress Cranial nerves 2-12 intact Cervical spine: full rom with no tenderness, nv intact distally Chest: active breath sounds bilaterally, no wheeze rhonchi or rales Heart: regular rate and rhythm, no murmur Abd: non tender non distended with active bowel sounds Hip is stable with rom  Left shoulder painful and weak rom Nv intact distally No rashes or edema  Assessment/Plan Assessment: left shoulder cuff arthropathy  Plan:  Patient will undergo a left reverse total shoulder by Dr. Ranell Patrick at [place] Risks benefits and expectations were discussed with the patient. Patient understand risks, benefits and expectations and wishes to proceed. Preoperative templating of the joint replacement has been completed, documented, and submitted to the Operating Room personnel in order to optimize intra-operative equipment management.   Alphonsa Overall PA-C, MPAS [facility] is now [employer] [address], [address], [place], [place] [address] Phone: [phone] [email or web address] Facebook  [employer]

## 2023-02-28 NOTE — Telephone Encounter (Signed)
Per pharmacy, Sherryll Burger needs a PA

## 2023-03-01 ENCOUNTER — Telehealth: Payer: Self-pay | Admitting: Pharmacy Technician

## 2023-03-01 ENCOUNTER — Other Ambulatory Visit (HOSPITAL_COMMUNITY): Payer: Self-pay

## 2023-03-01 NOTE — Telephone Encounter (Signed)
 Pharmacy Patient Advocate Encounter   Received notification from Pt Calls Messages that prior authorization for entresto  is required/requested.   Insurance verification completed.   The patient is insured through [place] .   Per test claim: The current 03/01/23 day co-pay is, $47.00 one month.  No PA needed at this time. This test claim was processed through [facility]- copay amounts may vary at other pharmacies due to pharmacy/plan contracts, or as the patient moves through the different stages of their insurance plan.     Pharmacy can call [phone] if any further issues.

## 2023-03-02 NOTE — Telephone Encounter (Signed)
 I spoke with wife. She is going to call [facility] and will call us  back if there is a problem. [employer] grant is still good

## 2023-03-07 DIAGNOSIS — I4891 Unspecified atrial fibrillation: Secondary | ICD-10-CM | POA: Diagnosis not present

## 2023-03-07 DIAGNOSIS — I5022 Chronic systolic (congestive) heart failure: Secondary | ICD-10-CM | POA: Diagnosis not present

## 2023-03-07 DIAGNOSIS — G4731 Primary central sleep apnea: Secondary | ICD-10-CM | POA: Diagnosis not present

## 2023-03-14 ENCOUNTER — Ambulatory Visit: Payer: Medicare Other | Admitting: Adult Health

## 2023-03-15 NOTE — Patient Instructions (Signed)
 SURGICAL WAITING ROOM VISITATION Patients having surgery or a procedure may have no more than 2 support people in the waiting area - these visitors may rotate in the visitor waiting room.   Due to an increase in RSV and influenza rates and associated hospitalizations, children ages 74 and under may not visit patients in [facility] hospitals. If the patient needs to stay at the hospital during part of their recovery, the visitor guidelines for inpatient rooms apply.  PRE-OP VISITATION  Pre-op nurse will coordinate an appropriate time for 1 support person to accompany the patient in pre-op.  This support person may not rotate.  This visitor will be contacted when the time is appropriate for the visitor to come back in the pre-op area.  Please refer to the [facility] website for the visitor guidelines for Inpatients (after your surgery is over and you are in a regular room).  You are not required to quarantine at this time prior to your surgery. However, you must do this: Hand Hygiene often Do NOT share personal items Notify your provider if you are in close contact with someone who has COVID or you develop fever 100.4 or greater, new onset of sneezing, cough, sore throat, shortness of breath or body aches.  If you test positive for Covid or have been in contact with anyone that has tested positive in the last 10 days please notify you surgeon.    Your procedure is scheduled on:  [facility]  March 22, 2023  Report to [facility] Main Entrance: [address]ota Jacobsen entrance where the [employer] is available.   Report to admitting at: 12:15  PM  Call this number if you have any questions or problems the morning of surgery [phone]  Do not eat food after Midnight the night prior to your surgery/procedure.  After Midnight you may have the following liquids until   11:45 AM DAY OF SURGERY  Clear Liquid Diet Water Black Coffee (sugar ok, NO MILK/CREAM OR CREAMERS)  Tea (sugar ok, NO  MILK/CREAM OR CREAMERS) regular and decaf                             Plain Jell-O  with no fruit (NO RED)                                           Fruit ices (not with fruit pulp, NO RED)                                     Popsicles (NO RED)                                                                  Juice: NO CITRUS JUICES: only apple, WHITE grape, WHITE cranberry Sports drinks like Gatorade or Powerade (NO RED)                   The day of surgery:  Drink ONE (1) Pre-Surgery Clear Ensure at  11;45  AM the morning of surgery. Drink  in one sitting. Do not sip.  This drink was given to you during your hospital pre-op appointment visit. Nothing else to drink after completing the Pre-Surgery Clear Ensure : No candy, chewing gum or throat lozenges.    FOLLOW ANY ADDITIONAL PRE OP INSTRUCTIONS YOU RECEIVED FROM YOUR SURGEON'S OFFICE!!!   Oral Hygiene is also important to reduce your risk of infection.        Remember - BRUSH YOUR TEETH THE MORNING OF SURGERY WITH YOUR REGULAR TOOTHPASTE  Do NOT smoke after Midnight the night before surgery.  STOP TAKING all Vitamins, Herbs and supplements 1 week before your surgery.   Take ONLY these medicines the morning of surgery with A SIP OF WATER: Metoprolol, Methadone, and Tylenol if needed.    If You have been diagnosed with Sleep Apnea - Bring CPAP mask and tubing day of surgery. We will provide you with a CPAP machine on the day of your surgery.                   You may not have any metal on your body including  jewelry, and body piercing  Do not wear lotions, powders, cologne, or deodorant  Men may shave face and neck.  Contacts, Hearing Aids, dentures or bridgework may not be worn into surgery. DENTURES WILL BE REMOVED PRIOR TO SURGERY PLEASE DO NOT APPLY "Poly grip" OR ADHESIVES!!!  You may bring a small overnight bag with you on the day of surgery, only pack items that are not valuable. [facility] IS NOT RESPONSIBLE   FOR  VALUABLES THAT ARE LOST OR STOLEN.   Do not bring your home medications to the hospital. The Pharmacy will dispense medications listed on your medication list to you during your admission in the Hospital.   Please read over the following fact sheets you were given: IF YOU HAVE QUESTIONS ABOUT YOUR PRE-OP INSTRUCTIONS, PLEASE CALL [phone].     Pre-operative 5 CHG Bath Instructions   You can play a key role in reducing the risk of infection after surgery. Your skin needs to be as free of germs as possible. You can reduce the number of germs on your skin by washing with CHG (chlorhexidine gluconate) soap before surgery. CHG is an antiseptic soap that kills germs and continues to kill germs even after washing.   DO NOT use if you have an allergy to chlorhexidine/CHG or antibacterial soaps. If your skin becomes reddened or irritated, stop using the CHG and notify one of our RNs at [phone]  Please shower with the CHG soap starting 4 days before surgery using the following schedule: START SHOWERS ON   SATURDAY  March 18, 2023  Please keep in mind the following:  DO NOT shave, including legs and underarms, starting the day of your first shower.   You may shave your face at any point before/day of surgery.   Place clean sheets on your bed the day you start using CHG soap. Use a clean washcloth (not used since being washed) for each shower. DO NOT sleep with pets once you start using the CHG.   CHG Shower Instructions:  If you choose to wash your hair and private area, wash first with your normal shampoo/soap.  After you use shampoo/soap, rinse your hair and body thoroughly to remove shampoo/soap residue.  Turn the water OFF and apply about 3 tablespoons (45 ml) of CHG soap to a CLEAN washcloth.  Apply CHG soap  ONLY FROM YOUR NECK DOWN TO YOUR TOES (washing for 3-5 minutes)  DO NOT use CHG soap on face, private areas, open wounds, or sores.  Pay special attention to the area where your surgery is being performed.  If you are having back surgery, having someone wash your back for you may be helpful.  Wait 2 minutes after CHG soap is applied, then you may rinse off the CHG soap.  Pat dry with a clean towel  Put on clean clothes/pajamas   If you choose to wear lotion, please use ONLY the CHG-compatible lotions on the back of this paper.     Additional instructions for the day of surgery: DO NOT APPLY any lotions, deodorants, cologne, or perfumes.   Put on clean/comfortable clothes.  Brush your teeth.  Ask your nurse before applying any prescription medications to the skin.      CHG Compatible Lotions   Aveeno Moisturizing lotion  Cetaphil Moisturizing Cream  Cetaphil Moisturizing Lotion  Clairol Herbal Essence Moisturizing Lotion, Dry Skin  Clairol Herbal Essence Moisturizing Lotion, Extra Dry Skin  Clairol Herbal Essence Moisturizing Lotion, Normal Skin  Curel Age Defying Therapeutic Moisturizing Lotion with Alpha Hydroxy  Curel Extreme Care Body Lotion  Curel Soothing Hands Moisturizing Hand Lotion  Curel Therapeutic Moisturizing Cream, Fragrance-Free  Curel Therapeutic Moisturizing Lotion, Fragrance-Free  Curel Therapeutic Moisturizing Lotion, Original Formula  Eucerin Daily Replenishing Lotion  Eucerin Dry Skin Therapy Plus Alpha Hydroxy Crme  Eucerin Dry Skin Therapy Plus Alpha Hydroxy Lotion  Eucerin Original Crme  Eucerin Original Lotion  Eucerin Plus Crme Eucerin Plus Lotion  Eucerin TriLipid Replenishing Lotion  Keri Anti-Bacterial Hand Lotion  Keri Deep Conditioning Original Lotion Dry Skin Formula Softly Scented  Keri Deep Conditioning Original Lotion, Fragrance Free Sensitive Skin Formula  Keri Lotion Fast Absorbing Fragrance Free Sensitive Skin Formula  Keri Lotion  Fast Absorbing Softly Scented Dry Skin Formula  Keri Original Lotion  Keri Skin Renewal Lotion Keri Silky Smooth Lotion  Keri Silky Smooth Sensitive Skin Lotion  Nivea Body Creamy Conditioning Oil  Nivea Body Extra Enriched Lotion  Nivea Body Original Lotion  Nivea Body Sheer Moisturizing Lotion Nivea Crme  Nivea Skin Firming Lotion  NutraDerm 30 Skin Lotion  NutraDerm Skin Lotion  NutraDerm Therapeutic Skin Cream  NutraDerm Therapeutic Skin Lotion  ProShield Protective Hand Cream  Provon moisturizing lotion        Preparing for Total Shoulder Arthroplasty ================================================================= Please follow these instructions carefully, in addition to any other special Bathing information that was explained to you at the Presurgical Appointment:  BENZOYL PEROXIDE 5% GEL: Used to kill bacteria on the skin which could cause an infection at the surgery site.   Please do not use if  you have an allergy to benzoyl peroxide. If your skin becomes reddened/irritated stop using the benzoyl peroxide and inform your Doctor.   Starting two days before surgery, apply as follows:  1. Apply benzoyl peroxide gel in the morning and at night. Apply after taking a shower. If you are not taking a shower, clean entire shoulder front, back, and side, along with the armpit with a clean wet washcloth.  2. Place a quarter-sized dollop of the gel on your SHOULDER and rub in thoroughly, making sure to cover the front, back, and side of your shoulder, along with the armpit.   2 Days prior to Surgery   [facility]  March 20, 2023 First Application _______ Morning Second Application _______ Night  Day Before Surgery     TUESDAY  March 21, 2023 First Application______ Morning  On the night before surgery, wash your entire body (except hair, face and private areas) with CHG Soap. THEN, rub in the LAST application of the Benzoyl Peroxide Gel on your shoulder.   3. On the  Morning of Surgery wash your BODY AGAIN with CHG Soap (except hair, face and private areas)  4. DO NOT USE THE BENZOYL PEROXIDE GEL ON THE DAY OF YOUR SURGERY       FAILURE TO FOLLOW THESE INSTRUCTIONS MAY RESULT IN THE CANCELLATION OF YOUR SURGERY  PATIENT SIGNATURE_________________________________  NURSE SIGNATURE__________________________________  ________________________________________________________________________        Frank Carpenter    An incentive spirometer is a tool that can help keep your lungs clear and active. This tool measures how well you are filling your lungs with each breath. Taking long deep breaths may help reverse or decrease the chance of developing breathing (pulmonary) problems (especially infection) following: A long period of time when you are unable to move or be active. BEFORE THE PROCEDURE  If the spirometer includes an indicator to show your best effort, your nurse or respiratory therapist will set it to a desired goal. If possible, sit up straight or lean slightly forward. Try not to slouch. Hold the incentive spirometer in an upright position. INSTRUCTIONS FOR USE  Sit on the edge of your bed if possible, or sit up as far as you can in bed or on a chair. Hold the incentive spirometer in an upright position. Breathe out normally. Place the mouthpiece in your mouth and seal your lips tightly around it. Breathe in slowly and as deeply as possible, raising the piston or the ball toward the top of the column. Hold your breath for 3-5 seconds or for as long as possible. Allow the piston or ball to fall to the bottom of the column. Remove the mouthpiece from your mouth and breathe out normally. Rest for a few seconds and repeat Steps 1 through 7 at least 10 times every 1-2 hours when you are awake. Take your time and take a few normal breaths between deep breaths. The spirometer may include an indicator to show your best effort. Use the  indicator as a goal to work toward during each repetition. After each set of 10 deep breaths, practice coughing to be sure your lungs are clear. If you have an incision (the cut made at the time of surgery), support your incision when coughing by placing a pillow or rolled up towels firmly against it. Once you are able to get out of bed, walk around indoors and cough well. You may stop using the incentive spirometer when instructed by your caregiver.  RISKS AND COMPLICATIONS Take  your time so you do not get dizzy or light-headed. If you are in pain, you may need to take or ask for pain medication before doing incentive spirometry. It is harder to take a deep breath if you are having pain. AFTER USE Rest and breathe slowly and easily. It can be helpful to keep track of a log of your progress. Your caregiver can provide you with a simple table to help with this. If you are using the spirometer at home, follow these instructions: SEEK MEDICAL CARE IF:  You are having difficultly using the spirometer. You have trouble using the spirometer as often as instructed. Your pain medication is not giving enough relief while using the spirometer. You develop fever of 100.5 F (38.1 C) or higher.                                                                                                    SEEK IMMEDIATE MEDICAL CARE IF:  You cough up bloody sputum that had not been present before. You develop fever of 102 F (38.9 C) or greater. You develop worsening pain at or near the incision site. MAKE SURE YOU:  Understand these instructions. Will watch your condition. Will get help right away if you are not doing well or get worse. Document Released: 05/23/2006 Document Revised: 04/04/2011 Document Reviewed: 07/24/2006 [facility] Patient Information 2014 [place], [place].      If you would like to see a video about joint replacement:    [email or web address]

## 2023-03-15 NOTE — Progress Notes (Signed)
 COVID Vaccine received:  []  No [x]  Yes Date of any COVID positive Test in last 90 days:  PCP - Jarrett Soho, PA-C at [facility].  Cardiologist - Nanetta Batty, MD ,Bernadene Person PA, clearance scanned to Media.  EP- York Pellant, MD   Chest x-ray - 02-21-2022  1V  Epic EKG -  12-26-2022  Epic Stress Test -  ECHO - 11-08-2022  Epic Cardiac Cath -   PCR screen: [x]  Ordered & Completed []   No Order but Needs PROFEND     []   N/A for this surgery  Surgery Plan:  []  Ambulatory   [x]  Outpatient in bed  []  Admit Anesthesia:    []  General  []  Spinal  [x]   Choice []   MAC  Pacemaker / ICD device [x]  No []  Yes   Spinal Cord Stimulator:[x]  No []  Yes       History of Sleep Apnea? []  No [x]  Yes   CPAP used?- []  No []  Yes    Does the patient monitor blood sugar?   [x]  N/A   []  No []  Yes  Patient has: [x]  NO Hx DM   []  Pre-DM   []  DM1  []   DM2 Last A1c was:5.9 on  12-05-2021     Blood Thinner / Instructions:Eliquis  Hold x 3 days  Ok'd w/ cardiology Aspirin Instructions:none  ERAS Protocol Ordered: []  No  [x]  Yes PRE-SURGERY [x]  ENSURE  []  G2    Patient is to be NPO after: 1145  Dental hx: []  Dentures:  []  N/A      []  Bridge or Partial:                   []  Loose or Damaged teeth:   Comments: The patient was given Benzoyl peroxide Gel as ordered. Instruction regarding application starting 2 days prior to surgery was given and patient voiced understanding.   Patient was given the 5 CHG shower / bath instructions for Reverse Shoulder arthroplasty surgery along with 2 bottles of the CHG soap. Patient will start this on: 03-18-2023. All questions were asked and answered, Patient voiced understanding of this process.   Activity level: Patient is able / unable to climb a flight of stairs without difficulty; []  No CP  []  No SOB, but would have ___   Patient can / can not perform ADLs without assistance.   Anesthesia review: CHF, A.fib (ablation 08-04-2022), HTN, CPS on Methadone,   ETOH,  OSA-    Patient denies shortness of breath, fever, cough and chest pain at PAT appointment.  Patient verbalized understanding and agreement to the Pre-Surgical Instructions that were given to them at this PAT appointment. Patient was also educated of the need to review these PAT instructions again prior to his/her surgery.I reviewed the appropriate phone numbers to call if they have any and questions or concerns.

## 2023-03-16 ENCOUNTER — Other Ambulatory Visit: Payer: Self-pay

## 2023-03-16 ENCOUNTER — Encounter (HOSPITAL_COMMUNITY): Payer: Self-pay

## 2023-03-16 ENCOUNTER — Encounter (HOSPITAL_COMMUNITY)
Admission: RE | Admit: 2023-03-16 | Discharge: 2023-03-16 | Disposition: A | Discharge status: Home / Self Care | Payer: Medicare Other | Source: Ambulatory Visit | Attending: Orthopedic Surgery | Admitting: Orthopedic Surgery

## 2023-03-16 VITALS — BP 116/58 | HR 72 | Temp 98.8°F | Resp 20 | Ht 72.0 in | Wt 226.0 lb

## 2023-03-16 DIAGNOSIS — I10 Essential (primary) hypertension: Secondary | ICD-10-CM | POA: Insufficient documentation

## 2023-03-16 DIAGNOSIS — Z01812 Encounter for preprocedural laboratory examination: Secondary | ICD-10-CM | POA: Diagnosis not present

## 2023-03-16 DIAGNOSIS — G894 Chronic pain syndrome: Secondary | ICD-10-CM | POA: Insufficient documentation

## 2023-03-16 DIAGNOSIS — Z79899 Other long term (current) drug therapy: Secondary | ICD-10-CM | POA: Insufficient documentation

## 2023-03-16 DIAGNOSIS — Z01818 Encounter for other preprocedural examination: Secondary | ICD-10-CM

## 2023-03-16 HISTORY — DX: Unspecified osteoarthritis, unspecified site: M19.90

## 2023-03-16 HISTORY — DX: Essential (primary) hypertension: I10

## 2023-03-16 HISTORY — DX: Cardiac arrhythmia, unspecified: I49.9

## 2023-03-16 LAB — COMPREHENSIVE METABOLIC PANEL
ALT: 33 U/L (ref 0–44)
AST: 24 U/L (ref 15–41)
Albumin: 4.1 g/dL (ref 3.5–5.0)
Alkaline Phosphatase: 61 U/L (ref 38–126)
Anion gap: 9 (ref 5–15)
BUN: 24 mg/dL — ABNORMAL HIGH (ref 8–23)
CO2: 26 mmol/L (ref 22–32)
Calcium: 9.1 mg/dL (ref 8.9–10.3)
Chloride: 102 mmol/L (ref 98–111)
Creatinine, Ser: 0.86 mg/dL (ref 0.61–1.24)
GFR, Estimated: >60 mL/min (ref >60)
Glucose, Bld: 112 mg/dL — ABNORMAL HIGH (ref 70–99)
Potassium: 4.3 mmol/L (ref 3.5–5.1)
Sodium: 137 mmol/L (ref 135–145)
Total Bilirubin: 0.9 mg/dL (ref 0.0–1.2)
Total Protein: 6.7 g/dL (ref 6.5–8.1)

## 2023-03-16 LAB — SURGICAL PCR SCREEN
MRSA, PCR: NEGATIVE
Staphylococcus aureus: NEGATIVE

## 2023-03-16 LAB — CBC
HCT: 53.8 % — ABNORMAL HIGH (ref 39.0–52.0)
Hemoglobin: 18 g/dL — ABNORMAL HIGH (ref 13.0–17.0)
MCH: 32.8 pg (ref 26.0–34.0)
MCHC: 33.5 g/dL (ref 30.0–36.0)
MCV: 98.2 fL (ref 80.0–100.0)
Platelets: 185 10*3/uL (ref 150–400)
RBC: 5.48 MIL/uL (ref 4.22–5.81)
RDW: 12.7 % (ref 11.5–15.5)
WBC: 8.8 10*3/uL (ref 4.0–10.5)
nRBC: 0 % (ref 0.0–0.2)

## 2023-03-17 ENCOUNTER — Encounter (HOSPITAL_COMMUNITY): Payer: Self-pay

## 2023-03-17 NOTE — Anesthesia Preprocedure Evaluation (Addendum)
 Anesthesia Evaluation  Patient identified by MRN, date of birth, ID band Patient awake    Reviewed: Allergy & Precautions, NPO status , Patient's Chart, lab work & pertinent test results, reviewed documented beta blocker date and time   History of Anesthesia Complications Negative for: history of anesthetic complications  Airway Mallampati: II  TM Distance: >3 FB Neck ROM: Full    Dental  (+) Dental Advisory Given, Teeth Intact   Pulmonary sleep apnea and Continuous Positive Airway Pressure Ventilation , former smoker   Pulmonary exam normal        Cardiovascular hypertension, Pt. on home beta blockers and Pt. on medications +CHF  Normal cardiovascular exam+ dysrhythmias Atrial Fibrillation (-) pacemaker   '24 TTE - EF 55 to 60%. There is mild concentric left ventricular hypertrophy. Grade I diastolic dysfunction (impaired relaxation). There is mild dilatation of the ascending aorta, measuring 42 mm. No significant valvular d/o identified.     Neuro/Psych  PSYCHIATRIC DISORDERS Anxiety     negative neurological ROS     GI/Hepatic negative GI ROS, Neg liver ROS,,,  Endo/Other   Obesity   Renal/GU negative Renal ROS     Musculoskeletal  (+) Arthritis ,    Abdominal   Peds  Hematology  On eliquis    Anesthesia Other Findings Chronic narcotic use   Reproductive/Obstetrics                             Anesthesia Physical Anesthesia Plan  ASA: 3  Anesthesia Plan: General   Post-op Pain Management: Tylenol PO (pre-op)* and Regional block*   Induction: Intravenous  PONV Risk Score and Plan: 2 and Treatment may vary due to age or medical condition, Ondansetron and Dexamethasone  Airway Management Planned: Oral ETT  Additional Equipment: None  Intra-op Plan:   Post-operative Plan: Extubation in OR  Informed Consent: I have reviewed the patients History and Physical, chart, labs  and discussed the procedure including the risks, benefits and alternatives for the proposed anesthesia with the patient or authorized representative who has indicated his/her understanding and acceptance.     Dental advisory given  Plan Discussed with: CRNA and Anesthesiologist  Anesthesia Plan Comments: (See PAT note from 2/20 by Sherlie Ban PA-C )        Anesthesia Quick Evaluation

## 2023-03-17 NOTE — Progress Notes (Signed)
 Case: [id number] Date/Time: 03/22/23 1437   Procedure: REVERSE SHOULDER ARTHROPLASTY (Left: Shoulder) - interscalene block   Anesthesia type: Choice   Pre-op diagnosis: Left shoulder rotator cuff arthropathy   Location: [facility] ROOM 05 / [facility]   Surgeons: Beverely Low, MD       DISCUSSION: Frank Carpenter is a 73 yo male who presents to PAT prior to surgery above. PMH of former smoking, HTN, A.fib (on Eliquis) s/p ablation (08/04/2022), CHF, TAA (4.1cm), OSA (uses CPAP), arthritis, chronic pain syndrome with narcotic dependence.  Patient follows with Cardiology for A.fib and hx of CHF. He is s/p multiple cardioversions and ultmately underwent an ablation on 08/04/2022. Has maintained SR since then. He also has hx of CHF with recovered EF. Last echo was on 11/08/22 and shows EF 55-60% with mild LVH, grade I DD, and mild dilatation of the ascending aorta measuring 42mm. Last seen on 12/26/22 in clinic. Doing well from cardiac standpoint. Cleared for surgery:  "Preoperative cardiac exam: According to the Revised Cardiac Risk Index (RCRI), his Perioperative Risk of Major Cardiac Event is (%): 0.9. His Functional Capacity in METs is: 8.97 according to the Duke Activity Status Index (DASI). Therefore, based on ACC/AHA guidelines, patient would be at acceptable risk for the planned procedure without further cardiovascular testing. I will route this recommendation to the requesting party via Epic fax function. Per office protocol, patient can hold apixaban (Eliquis) for 3 days prior to procedure. Patient will not need bridging with Lovenox (enoxaparin) around procedure. Please resume Eliquis as soon as possible postprocedure, at the discretion of the surgeon."  LD Eliquis: Saturday 03-18-23  VS: BP (!) 116/58 Comment: right arm sitting  Pulse 72   Temp 37.1 C (Oral)   Resp 20   Ht 6' (1.829 m)   Wt 102.5 kg   SpO2 95%   BMI 30.65 kg/m   PROVIDERS: PCP - Jarrett Soho, PA-C at [facility].   Cardiologist - Nanetta Batty, MD EP- York Pellant, MD   LABS: Labs reviewed: Acceptable for surgery. (all labs ordered are listed, but only abnormal results are displayed)  Labs Reviewed  COMPREHENSIVE METABOLIC PANEL - Abnormal; Notable for the following components:      Result Value   Glucose, Bld 112 (*)    BUN 24 (*)    All other components within normal limits  CBC - Abnormal; Notable for the following components:   Hemoglobin 18.0 (*)    HCT 53.8 (*)    All other components within normal limits  SURGICAL PCR SCREEN     IMAGES:  CTA chest 07/29/22:  IMPRESSION: 1. Mild fusiform dilatation of the ascending aorta, 4.1 cm. Recommend annual imaging followup by CTA or MRA. This recommendation follows 2010 ACCF/AHA/AATS/ACR/ASA/SCA/SCAI/SIR/STS/SVM Guidelines for the Diagnosis and Management of Patients with Thoracic Aortic Disease. Circulation. 2010; 121: A540-J811. Aortic aneurysm NOS (ICD10-I71.9) 2. No acute intrathoracic abnormality.    EKG 12/26/22:  Sinus rhythm with frequent Premature ventricular complexes, rate 76  CV:  Ablation 11/08/22:  CONCLUSIONS: 1. Successful PVI 2.Intracardiac echo reveals no significant effusion 3. No early apparent complications.  Echo 11/08/2022:  IMPRESSIONS    1. Left ventricular ejection fraction, by estimation, is 55 to 60%. Left ventricular ejection fraction by PLAX is 57 %. The left ventricle has normal function. The left ventricle has no regional wall motion abnormalities. There is mild concentric left ventricular hypertrophy. Left ventricular diastolic parameters are consistent with Grade I diastolic dysfunction (impaired relaxation).  2. Right ventricular systolic  function is normal. The right ventricular size is normal. There is normal pulmonary artery systolic pressure.  3. The mitral valve is normal in structure. No evidence of mitral valve regurgitation. No evidence of mitral stenosis.  4. The aortic  valve is tricuspid. Aortic valve regurgitation is not visualized. No aortic stenosis is present.  5. Aortic dilatation noted. There is mild dilatation of the ascending aorta, measuring 42 mm.  6. The inferior vena cava is normal in size with greater than 50% respiratory variability, suggesting right atrial pressure of 3 mmHg.  CT calcium score 06/26/2019:  IMPRESSION: Coronary calcium score of 0.   Suggest f/u CTA of aorta in 6 months   Past Medical History:  Diagnosis Date   Arthritis    Atrial fibrillation, chronic (HCC)    Chronic systolic (congestive) heart failure (HCC)    Dysrhythmia    A.fib   High cholesterol    Hypertension    OSA (obstructive sleep apnea)    Testosterone deficiency     Past Surgical History:  Procedure Laterality Date   ATRIAL FIBRILLATION ABLATION N/A 08/04/2022   Procedure: ATRIAL FIBRILLATION ABLATION;  Surgeon: Maurice Small, MD;  Location: [facility];  Service: Cardiovascular;  Laterality: N/A;   CARDIOVERSION N/A 02/24/2022   Procedure: CARDIOVERSION;  Surgeon: Wendall Stade, MD;  Location: [facility] ENDOSCOPY;  Service: Cardiovascular;  Laterality: N/A;   CARDIOVERSION N/A 03/30/2022   Procedure: CARDIOVERSION;  Surgeon: Meriam Sprague, MD;  Location: [facility] ENDOSCOPY;  Service: Cardiovascular;  Laterality: N/A;   CARDIOVERSION N/A 05/12/2022   Procedure: CARDIOVERSION;  Surgeon: Thomasene Ripple, DO;  Location: [facility] INVASIVE CV LAB;  Service: Cardiovascular;  Laterality: N/A;   NASAL SEPTUM SURGERY  04/2021   TONSILLECTOMY     TRACHEOSTOMY  1952   at age 74 d/t croup, respiratory distress    MEDICATIONS:  acetaminophen (TYLENOL) 500 MG tablet   aspirin-sod bicarb-citric acid (ALKA-SELTZER) 325 MG TBEF tablet   dapagliflozin propanediol (FARXIGA) 10 MG TABS tablet   docusate sodium (COLACE) 100 MG capsule   ELIQUIS 5 MG TABS tablet   ENTRESTO 24-26 MG   furosemide (LASIX) 40 MG tablet   hydrOXYzine (ATARAX) 25 MG tablet    methadone (DOLOPHINE) 10 MG/ML solution   metoprolol tartrate (LOPRESSOR) 50 MG tablet   mirtazapine (REMERON) 15 MG tablet   Multiple Vitamin (MULTIVITAMIN) capsule   spironolactone (ALDACTONE) 25 MG tablet   testosterone cypionate (DEPOTESTOSTERONE CYPIONATE) 200 MG/ML injection   No current facility-administered medications for this encounter.   Marcille Blanco [facility] Short Stay/Anesthesiology [facility] Phone [phone] 03/17/2023 10:29 AM

## 2023-03-22 ENCOUNTER — Encounter (HOSPITAL_COMMUNITY)
Admission: RE | Disposition: A | Discharge status: Home / Self Care | Payer: Self-pay | Source: Ambulatory Visit | Attending: Orthopedic Surgery

## 2023-03-22 ENCOUNTER — Observation Stay (HOSPITAL_COMMUNITY): Payer: Medicare Other

## 2023-03-22 ENCOUNTER — Observation Stay (HOSPITAL_COMMUNITY)
Admission: RE | Admit: 2023-03-22 | Discharge: 2023-03-23 | Disposition: A | Discharge status: Home / Self Care | Payer: Medicare Other | Source: Ambulatory Visit | Attending: Orthopedic Surgery | Admitting: Orthopedic Surgery

## 2023-03-22 ENCOUNTER — Other Ambulatory Visit: Payer: Self-pay

## 2023-03-22 ENCOUNTER — Encounter (HOSPITAL_COMMUNITY): Payer: Self-pay | Admitting: Orthopedic Surgery

## 2023-03-22 ENCOUNTER — Ambulatory Visit (HOSPITAL_COMMUNITY): Payer: Medicare Other | Admitting: Medical

## 2023-03-22 ENCOUNTER — Ambulatory Visit (HOSPITAL_COMMUNITY): Payer: Medicare Other | Admitting: Certified Registered Nurse Anesthetist

## 2023-03-22 DIAGNOSIS — I110 Hypertensive heart disease with heart failure: Secondary | ICD-10-CM | POA: Diagnosis not present

## 2023-03-22 DIAGNOSIS — I482 Chronic atrial fibrillation, unspecified: Secondary | ICD-10-CM | POA: Diagnosis not present

## 2023-03-22 DIAGNOSIS — Z87891 Personal history of nicotine dependence: Secondary | ICD-10-CM | POA: Insufficient documentation

## 2023-03-22 DIAGNOSIS — I4891 Unspecified atrial fibrillation: Secondary | ICD-10-CM | POA: Diagnosis not present

## 2023-03-22 DIAGNOSIS — Z79899 Other long term (current) drug therapy: Secondary | ICD-10-CM | POA: Insufficient documentation

## 2023-03-22 DIAGNOSIS — I509 Heart failure, unspecified: Secondary | ICD-10-CM | POA: Diagnosis not present

## 2023-03-22 DIAGNOSIS — I5032 Chronic diastolic (congestive) heart failure: Secondary | ICD-10-CM | POA: Diagnosis not present

## 2023-03-22 DIAGNOSIS — M12812 Other specific arthropathies, not elsewhere classified, left shoulder: Secondary | ICD-10-CM | POA: Diagnosis not present

## 2023-03-22 DIAGNOSIS — Z7901 Long term (current) use of anticoagulants: Secondary | ICD-10-CM | POA: Insufficient documentation

## 2023-03-22 DIAGNOSIS — Z96612 Presence of left artificial shoulder joint: Principal | ICD-10-CM

## 2023-03-22 DIAGNOSIS — I5022 Chronic systolic (congestive) heart failure: Secondary | ICD-10-CM | POA: Diagnosis not present

## 2023-03-22 DIAGNOSIS — G8918 Other acute postprocedural pain: Secondary | ICD-10-CM | POA: Diagnosis not present

## 2023-03-22 DIAGNOSIS — M12512 Traumatic arthropathy, left shoulder: Secondary | ICD-10-CM | POA: Diagnosis not present

## 2023-03-22 DIAGNOSIS — M19012 Primary osteoarthritis, left shoulder: Secondary | ICD-10-CM | POA: Diagnosis not present

## 2023-03-22 HISTORY — PX: REVERSE SHOULDER ARTHROPLASTY: SHX5054

## 2023-03-22 SURGERY — ARTHROPLASTY, SHOULDER, TOTAL, REVERSE
Anesthesia: General | Site: Shoulder | Laterality: Left

## 2023-03-22 MED ORDER — PHENYLEPHRINE HCL-NACL 20-0.9 MG/250ML-% IV SOLN
INTRAVENOUS | Status: DC | PRN
  Administered 2023-03-22: 40 ug/min via INTRAVENOUS

## 2023-03-22 MED ORDER — METOPROLOL TARTRATE 50 MG PO TABS
50.0000 mg | ORAL_TABLET | Freq: Two times a day (BID) | ORAL | Status: DC
  Administered 2023-03-22 – 2023-03-23 (×2): 50 mg via ORAL
  Filled 2023-03-22 (×2): qty 1

## 2023-03-22 MED ORDER — BUPIVACAINE-EPINEPHRINE (PF) 0.25% -1:200000 IJ SOLN
INTRAMUSCULAR | Status: AC
  Filled 2023-03-22: qty 30

## 2023-03-22 MED ORDER — HYDROMORPHONE HCL 1 MG/ML IJ SOLN: 0.5000 mg | INTRAMUSCULAR | Status: DC | PRN

## 2023-03-22 MED ORDER — PROPOFOL 10 MG/ML IV BOLUS
INTRAVENOUS | Status: DC | PRN
  Administered 2023-03-22: 150 mg via INTRAVENOUS

## 2023-03-22 MED ORDER — METOCLOPRAMIDE HCL 5 MG PO TABS: 5.0000 mg | ORAL_TABLET | Freq: Three times a day (TID) | ORAL | Status: DC | PRN

## 2023-03-22 MED ORDER — DEXAMETHASONE SODIUM PHOSPHATE 10 MG/ML IJ SOLN
INTRAMUSCULAR | Status: AC
  Filled 2023-03-22: qty 1

## 2023-03-22 MED ORDER — SODIUM CHLORIDE 0.9% FLUSH
3.0000 mL | Freq: Two times a day (BID) | INTRAVENOUS | Status: DC
  Administered 2023-03-22: 10 mL via INTRAVENOUS

## 2023-03-22 MED ORDER — FENTANYL CITRATE (PF) 100 MCG/2ML IJ SOLN
INTRAMUSCULAR | Status: AC
  Filled 2023-03-22: qty 2

## 2023-03-22 MED ORDER — CEFAZOLIN SODIUM-DEXTROSE 2-4 GM/100ML-% IV SOLN
2.0000 g | Freq: Four times a day (QID) | INTRAVENOUS | Status: AC
  Administered 2023-03-22 – 2023-03-23 (×2): 2 g via INTRAVENOUS
  Filled 2023-03-22 (×2): qty 100

## 2023-03-22 MED ORDER — METHOCARBAMOL 1000 MG/10ML IJ SOLN: 500.0000 mg | Freq: Four times a day (QID) | INTRAMUSCULAR | Status: DC | PRN

## 2023-03-22 MED ORDER — 0.9 % SODIUM CHLORIDE (POUR BTL) OPTIME
TOPICAL | Status: DC | PRN
  Administered 2023-03-22: 1000 mL

## 2023-03-22 MED ORDER — ONDANSETRON HCL 4 MG/2ML IJ SOLN: 4.0000 mg | Freq: Once | INTRAMUSCULAR | Status: DC | PRN

## 2023-03-22 MED ORDER — PHENOL 1.4 % MT LIQD
1.0000 | OROMUCOSAL | Status: DC | PRN
  Administered 2023-03-22: 1 via OROMUCOSAL
  Filled 2023-03-22: qty 177

## 2023-03-22 MED ORDER — LIDOCAINE HCL (PF) 2 % IJ SOLN
INTRAMUSCULAR | Status: AC
Start: 2023-03-22 — End: ?
  Filled 2023-03-22: qty 5

## 2023-03-22 MED ORDER — SACUBITRIL-VALSARTAN 24-26 MG PO TABS
1.0000 | ORAL_TABLET | Freq: Two times a day (BID) | ORAL | Status: DC
  Administered 2023-03-23: 1 via ORAL
  Filled 2023-03-22: qty 1

## 2023-03-22 MED ORDER — ONDANSETRON HCL 4 MG/2ML IJ SOLN
INTRAMUSCULAR | Status: AC
  Filled 2023-03-22: qty 2

## 2023-03-22 MED ORDER — BUPIVACAINE-EPINEPHRINE (PF) 0.25% -1:200000 IJ SOLN
INTRAMUSCULAR | Status: DC | PRN
  Administered 2023-03-22: 14 mL

## 2023-03-22 MED ORDER — PHENYLEPHRINE HCL (PRESSORS) 10 MG/ML IV SOLN
INTRAVENOUS | Status: AC
  Filled 2023-03-22: qty 1

## 2023-03-22 MED ORDER — STERILE WATER FOR IRRIGATION IR SOLN
Status: DC | PRN
  Administered 2023-03-22: 1000 mL

## 2023-03-22 MED ORDER — ONDANSETRON HCL 4 MG/2ML IJ SOLN
INTRAMUSCULAR | Status: DC | PRN
  Administered 2023-03-22: 4 mg via INTRAVENOUS

## 2023-03-22 MED ORDER — FENTANYL CITRATE PF 50 MCG/ML IJ SOSY
50.0000 ug | PREFILLED_SYRINGE | INTRAMUSCULAR | Status: DC
  Administered 2023-03-22: 50 ug via INTRAVENOUS
  Filled 2023-03-22: qty 2

## 2023-03-22 MED ORDER — MIDAZOLAM HCL 2 MG/2ML IJ SOLN
INTRAMUSCULAR | Status: DC | PRN
  Administered 2023-03-22: 2 mg via INTRAVENOUS

## 2023-03-22 MED ORDER — ROCURONIUM BROMIDE 10 MG/ML (PF) SYRINGE
PREFILLED_SYRINGE | INTRAVENOUS | Status: AC
  Filled 2023-03-22: qty 10

## 2023-03-22 MED ORDER — LIDOCAINE HCL (PF) 2 % IJ SOLN
INTRAMUSCULAR | Status: AC
  Filled 2023-03-22: qty 5

## 2023-03-22 MED ORDER — MIRTAZAPINE 15 MG PO TABS
15.0000 mg | ORAL_TABLET | Freq: Every evening | ORAL | Status: DC | PRN
  Administered 2023-03-22: 15 mg via ORAL
  Filled 2023-03-22: qty 1

## 2023-03-22 MED ORDER — ONABOTULINUMTOXINA 100 UNITS IJ SOLR
INTRAMUSCULAR | Status: AC
  Filled 2023-03-22: qty 100

## 2023-03-22 MED ORDER — SODIUM CHLORIDE (PF) 0.9 % IJ SOLN
INTRAMUSCULAR | Status: AC
  Filled 2023-03-22: qty 50

## 2023-03-22 MED ORDER — CEFAZOLIN SODIUM-DEXTROSE 2-4 GM/100ML-% IV SOLN
2.0000 g | INTRAVENOUS | Status: AC
  Administered 2023-03-22: 2 g via INTRAVENOUS
  Filled 2023-03-22: qty 100

## 2023-03-22 MED ORDER — MIDAZOLAM HCL 2 MG/2ML IJ SOLN
1.0000 mg | INTRAMUSCULAR | Status: DC
  Administered 2023-03-22: 1 mg via INTRAVENOUS
  Filled 2023-03-22: qty 2

## 2023-03-22 MED ORDER — METHOCARBAMOL 500 MG PO TABS: 500.0000 mg | ORAL_TABLET | Freq: Four times a day (QID) | ORAL | Status: DC | PRN

## 2023-03-22 MED ORDER — METHOCARBAMOL 500 MG PO TABS: 500.0000 mg | ORAL_TABLET | Freq: Three times a day (TID) | ORAL | 1 refills | Status: AC | PRN

## 2023-03-22 MED ORDER — BUPIVACAINE HCL (PF) 0.5 % IJ SOLN
INTRAMUSCULAR | Status: DC | PRN
  Administered 2023-03-22: 15 mL via PERINEURAL

## 2023-03-22 MED ORDER — SUGAMMADEX SODIUM 200 MG/2ML IV SOLN
INTRAVENOUS | Status: AC
  Filled 2023-03-22: qty 2

## 2023-03-22 MED ORDER — EPHEDRINE SULFATE-NACL 50-0.9 MG/10ML-% IV SOSY
PREFILLED_SYRINGE | INTRAVENOUS | Status: DC | PRN
  Administered 2023-03-22: 10 mg via INTRAVENOUS

## 2023-03-22 MED ORDER — OXYCODONE HCL 5 MG PO TABS: 5.0000 mg | ORAL_TABLET | Freq: Once | ORAL | Status: DC | PRN

## 2023-03-22 MED ORDER — LIDOCAINE HCL (CARDIAC) PF 100 MG/5ML IV SOSY
PREFILLED_SYRINGE | INTRAVENOUS | Status: DC | PRN
  Administered 2023-03-22: 100 mg via INTRAVENOUS

## 2023-03-22 MED ORDER — FENTANYL CITRATE PF 50 MCG/ML IJ SOSY: 25.0000 ug | PREFILLED_SYRINGE | INTRAMUSCULAR | Status: DC | PRN

## 2023-03-22 MED ORDER — METOCLOPRAMIDE HCL 5 MG/ML IJ SOLN: 5.0000 mg | Freq: Three times a day (TID) | INTRAMUSCULAR | Status: DC | PRN

## 2023-03-22 MED ORDER — SPIRONOLACTONE 12.5 MG HALF TABLET
12.5000 mg | ORAL_TABLET | Freq: Every day | ORAL | Status: DC
  Administered 2023-03-23: 12.5 mg via ORAL
  Filled 2023-03-22: qty 1

## 2023-03-22 MED ORDER — EPHEDRINE SULFATE (PRESSORS) 50 MG/ML IJ SOLN: INTRAMUSCULAR | Status: DC | PRN

## 2023-03-22 MED ORDER — PROPOFOL 10 MG/ML IV BOLUS
INTRAVENOUS | Status: AC
  Filled 2023-03-22: qty 20

## 2023-03-22 MED ORDER — DAPAGLIFLOZIN PROPANEDIOL 10 MG PO TABS
10.0000 mg | ORAL_TABLET | Freq: Every day | ORAL | Status: DC
Start: 2023-03-23 — End: 2023-03-23
  Administered 2023-03-23: 10 mg via ORAL
  Filled 2023-03-22: qty 1

## 2023-03-22 MED ORDER — POLYETHYLENE GLYCOL 3350 17 G PO PACK: 17.0000 g | PACK | Freq: Every day | ORAL | Status: DC | PRN

## 2023-03-22 MED ORDER — TESTOSTERONE CYPIONATE 200 MG/ML IM SOLN: 100.0000 mg | INTRAMUSCULAR | Status: DC

## 2023-03-22 MED ORDER — TRANEXAMIC ACID-NACL 1000-0.7 MG/100ML-% IV SOLN
1000.0000 mg | INTRAVENOUS | Status: AC
  Administered 2023-03-22: 1000 mg via INTRAVENOUS
  Filled 2023-03-22: qty 100

## 2023-03-22 MED ORDER — SUCCINYLCHOLINE CHLORIDE 200 MG/10ML IV SOSY
PREFILLED_SYRINGE | INTRAVENOUS | Status: AC
  Filled 2023-03-22: qty 10

## 2023-03-22 MED ORDER — ACETAMINOPHEN 500 MG PO TABS
1000.0000 mg | ORAL_TABLET | Freq: Once | ORAL | Status: AC
  Administered 2023-03-22: 500 mg via ORAL
  Filled 2023-03-22: qty 2

## 2023-03-22 MED ORDER — OXYCODONE-ACETAMINOPHEN 5-325 MG PO TABS: 1.0000 | ORAL_TABLET | ORAL | 0 refills | Status: AC | PRN

## 2023-03-22 MED ORDER — HYDROXYZINE HCL 25 MG PO TABS: 25.0000 mg | ORAL_TABLET | Freq: Three times a day (TID) | ORAL | Status: DC | PRN

## 2023-03-22 MED ORDER — DOCUSATE SODIUM 100 MG PO CAPS
100.0000 mg | ORAL_CAPSULE | Freq: Two times a day (BID) | ORAL | Status: DC
  Administered 2023-03-22 – 2023-03-23 (×2): 100 mg via ORAL
  Filled 2023-03-22 (×2): qty 1

## 2023-03-22 MED ORDER — DOCUSATE SODIUM 100 MG PO CAPS: 200.0000 mg | ORAL_CAPSULE | Freq: Every day | ORAL | Status: DC

## 2023-03-22 MED ORDER — ASPIRIN EFFERVESCENT 325 MG PO TBEF: 650.0000 mg | EFFERVESCENT_TABLET | Freq: Four times a day (QID) | ORAL | Status: DC | PRN

## 2023-03-22 MED ORDER — TRANEXAMIC ACID-NACL 1000-0.7 MG/100ML-% IV SOLN
1000.0000 mg | Freq: Once | INTRAVENOUS | Status: AC
  Administered 2023-03-22: 1000 mg via INTRAVENOUS
  Filled 2023-03-22: qty 100

## 2023-03-22 MED ORDER — METHADONE HCL 10 MG PO TABS
150.0000 mg | ORAL_TABLET | Freq: Every day | ORAL | Status: DC
  Administered 2023-03-23: 150 mg via ORAL
  Filled 2023-03-22: qty 15

## 2023-03-22 MED ORDER — SODIUM CHLORIDE 0.9% FLUSH: 3.0000 mL | INTRAVENOUS | Status: DC | PRN

## 2023-03-22 MED ORDER — SUCCINYLCHOLINE CHLORIDE 200 MG/10ML IV SOSY
PREFILLED_SYRINGE | INTRAVENOUS | Status: DC | PRN
  Administered 2023-03-22: 120 mg via INTRAVENOUS

## 2023-03-22 MED ORDER — ACETAMINOPHEN 325 MG PO TABS: 325.0000 mg | ORAL_TABLET | Freq: Four times a day (QID) | ORAL | Status: DC | PRN

## 2023-03-22 MED ORDER — BUPIVACAINE LIPOSOME 1.3 % IJ SUSP
INTRAMUSCULAR | Status: DC | PRN
  Administered 2023-03-22: 10 mL via PERINEURAL

## 2023-03-22 MED ORDER — APIXABAN 5 MG PO TABS
5.0000 mg | ORAL_TABLET | Freq: Two times a day (BID) | ORAL | Status: DC
  Administered 2023-03-23: 5 mg via ORAL
  Filled 2023-03-22: qty 1

## 2023-03-22 MED ORDER — EPHEDRINE 5 MG/ML INJ
INTRAVENOUS | Status: AC
  Filled 2023-03-22: qty 5

## 2023-03-22 MED ORDER — CHLORHEXIDINE GLUCONATE 0.12 % MT SOLN
15.0000 mL | Freq: Once | OROMUCOSAL | Status: AC
  Administered 2023-03-22: 15 mL via OROMUCOSAL

## 2023-03-22 MED ORDER — DEXAMETHASONE SODIUM PHOSPHATE 10 MG/ML IJ SOLN
INTRAMUSCULAR | Status: DC | PRN
  Administered 2023-03-22: 8 mg via INTRAVENOUS

## 2023-03-22 MED ORDER — ONDANSETRON HCL 4 MG PO TABS: 4.0000 mg | ORAL_TABLET | Freq: Four times a day (QID) | ORAL | Status: DC | PRN

## 2023-03-22 MED ORDER — ADULT MULTIVITAMIN W/MINERALS CH
1.0000 | ORAL_TABLET | Freq: Every day | ORAL | Status: DC
Start: 2023-03-23 — End: 2023-03-23
  Administered 2023-03-23: 1 via ORAL
  Filled 2023-03-22: qty 1

## 2023-03-22 MED ORDER — ORAL CARE MOUTH RINSE: 15.0000 mL | Freq: Once | OROMUCOSAL | Status: AC

## 2023-03-22 MED ORDER — LACTATED RINGERS IV SOLN: INTRAVENOUS | Status: DC

## 2023-03-22 MED ORDER — OXYCODONE HCL 5 MG PO TABS: 5.0000 mg | ORAL_TABLET | ORAL | Status: DC | PRN

## 2023-03-22 MED ORDER — MENTHOL 3 MG MT LOZG: 1.0000 | LOZENGE | OROMUCOSAL | Status: DC | PRN

## 2023-03-22 MED ORDER — MIDAZOLAM HCL 2 MG/2ML IJ SOLN
INTRAMUSCULAR | Status: AC
  Filled 2023-03-22: qty 2

## 2023-03-22 MED ORDER — ONDANSETRON HCL 4 MG/2ML IJ SOLN: 4.0000 mg | Freq: Four times a day (QID) | INTRAMUSCULAR | Status: DC | PRN

## 2023-03-22 MED ORDER — PROPOFOL 500 MG/50ML IV EMUL
INTRAVENOUS | Status: AC
  Filled 2023-03-22: qty 50

## 2023-03-22 MED ORDER — PHENYLEPHRINE HCL (PRESSORS) 10 MG/ML IV SOLN
INTRAVENOUS | Status: AC
Start: 2023-03-22 — End: ?
  Filled 2023-03-22: qty 1

## 2023-03-22 MED ORDER — FUROSEMIDE 40 MG PO TABS
40.0000 mg | ORAL_TABLET | Freq: Every day | ORAL | Status: DC
  Filled 2023-03-22: qty 1

## 2023-03-22 MED ORDER — OXYCODONE HCL 5 MG/5ML PO SOLN: 5.0000 mg | Freq: Once | ORAL | Status: DC | PRN

## 2023-03-22 MED ORDER — ONDANSETRON HCL 4 MG/2ML IJ SOLN
INTRAMUSCULAR | Status: AC
Start: 2023-03-22 — End: ?
  Filled 2023-03-22: qty 2

## 2023-03-22 MED ORDER — FENTANYL CITRATE (PF) 100 MCG/2ML IJ SOLN
INTRAMUSCULAR | Status: AC
Start: 2023-03-22 — End: ?
  Filled 2023-03-22: qty 2

## 2023-03-22 SURGICAL SUPPLY — 61 items
BAG COUNTER SPONGE SURGICOUNT (BAG) IMPLANT
BAG ZIPLOCK 12X15 (MISCELLANEOUS) IMPLANT
BIT DRILL 1.6MX128 (BIT) IMPLANT
BIT DRILL 170X2.5X (BIT) IMPLANT
BIT DRL 170X2.5X (BIT) ×1 IMPLANT
BLADE SAG 18X100X1.27 (BLADE) ×1 IMPLANT
COVER BACK TABLE 60X90IN (DRAPES) ×1 IMPLANT
COVER SURGICAL LIGHT HANDLE (MISCELLANEOUS) ×1 IMPLANT
CUP HUMERAL 42 PLUS 3 (Orthopedic Implant) IMPLANT
DRAPE INCISE IOBAN 66X45 STRL (DRAPES) ×1 IMPLANT
DRAPE SHEET LG 3/4 BI-LAMINATE (DRAPES) ×1 IMPLANT
DRAPE SURG ORHT 6 SPLT 77X108 (DRAPES) ×2 IMPLANT
DRAPE TOP 10253 STERILE (DRAPES) ×1 IMPLANT
DRAPE U-SHAPE 47X51 STRL (DRAPES) ×1 IMPLANT
DRSG ADAPTIC 3X8 NADH LF (GAUZE/BANDAGES/DRESSINGS) ×1 IMPLANT
DURAPREP 26ML APPLICATOR (WOUND CARE) ×1 IMPLANT
ELECT BLADE TIP CTD 4 INCH (ELECTRODE) ×1 IMPLANT
ELECT NDL TIP 2.8 STRL (NEEDLE) ×1 IMPLANT
ELECT NEEDLE TIP 2.8 STRL (NEEDLE) ×1 IMPLANT
ELECT REM PT RETURN 15FT ADLT (MISCELLANEOUS) ×1 IMPLANT
EPIPHSYSI CENTER SZ 2 LT (Shoulder) ×1 IMPLANT
EPIPHYSIS CENTER SZ 2 LT (Shoulder) IMPLANT
FACESHIELD WRAPAROUND (MASK) ×1 IMPLANT
FACESHIELD WRAPAROUND OR TEAM (MASK) ×1 IMPLANT
GAUZE PAD ABD 8X10 STRL (GAUZE/BANDAGES/DRESSINGS) ×1 IMPLANT
GAUZE SPONGE 4X4 12PLY STRL (GAUZE/BANDAGES/DRESSINGS) ×1 IMPLANT
GLENOSPHERE XTEND LAT 42+0 STD (Miscellaneous) IMPLANT
GLOVE BIOGEL PI IND STRL 7.5 (GLOVE) ×1 IMPLANT
GLOVE BIOGEL PI IND STRL 8.5 (GLOVE) ×1 IMPLANT
GLOVE ORTHO TXT STRL SZ7.5 (GLOVE) ×1 IMPLANT
GLOVE SURG ORTHO 8.5 STRL (GLOVE) ×1 IMPLANT
GOWN STRL REUS W/ TWL XL LVL3 (GOWN DISPOSABLE) ×2 IMPLANT
KIT BASIN OR (CUSTOM PROCEDURE TRAY) ×1 IMPLANT
KIT TURNOVER KIT A (KITS) IMPLANT
MANIFOLD NEPTUNE II (INSTRUMENTS) ×1 IMPLANT
METAGLENE DELTA EXTEND (Trauma) IMPLANT
METAGLENE DXTEND (Trauma) ×1 IMPLANT
NDL MAYO CATGUT SZ4 TPR NDL (NEEDLE) IMPLANT
NEEDLE MAYO CATGUT SZ4 (NEEDLE) IMPLANT
NS IRRIG 1000ML POUR BTL (IV SOLUTION) ×1 IMPLANT
PACK SHOULDER (CUSTOM PROCEDURE TRAY) ×1 IMPLANT
PIN GUIDE 1.2 (PIN) IMPLANT
PIN GUIDE GLENOPHERE 1.5MX300M (PIN) IMPLANT
PIN METAGLENE 2.5 (PIN) IMPLANT
RESTRAINT HEAD UNIVERSAL NS (MISCELLANEOUS) ×1 IMPLANT
SCREW 4.5X36MM (Screw) IMPLANT
SCREW 48L (Screw) IMPLANT
SCREW BN 18X4.5XSTRL SHLDR (Screw) IMPLANT
SLING ARM FOAM STRAP LRG (SOFTGOODS) IMPLANT
SPIKE FLUID TRANSFER (MISCELLANEOUS) ×1 IMPLANT
SPONGE T-LAP 4X18 ~~LOC~~+RFID (SPONGE) IMPLANT
STEM 12 HA (Stem) IMPLANT
STRIP CLOSURE SKIN 1/2X4 (GAUZE/BANDAGES/DRESSINGS) ×1 IMPLANT
SUT FIBERWIRE #2 38 T-5 BLUE (SUTURE) ×1 IMPLANT
SUT MNCRL AB 4-0 PS2 18 (SUTURE) ×1 IMPLANT
SUT VIC AB 0 CT1 36 (SUTURE) ×1 IMPLANT
SUT VIC AB 0 CT2 27 (SUTURE) ×1 IMPLANT
SUT VIC AB 2-0 CT1 TAPERPNT 27 (SUTURE) ×1 IMPLANT
SUTURE FIBERWR #2 38 T-5 BLUE (SUTURE) ×1 IMPLANT
TOWEL GREEN STERILE FF (TOWEL DISPOSABLE) ×1 IMPLANT
TOWEL OR 17X26 10 PK STRL BLUE (TOWEL DISPOSABLE) ×1 IMPLANT

## 2023-03-22 NOTE — Interval H&P Note (Signed)
 History and Physical Interval Note:  03/22/2023 12:12 PM  Frank Carpenter  has presented today for surgery, with the diagnosis of Left shoulder rotator cuff arthropathy.  The various methods of treatment have been discussed with the patient and family. After consideration of risks, benefits and other options for treatment, the patient has consented to  Procedure(s) with comments: REVERSE SHOULDER ARTHROPLASTY (Left) - interscalene block as a surgical intervention.  The patient's history has been reviewed, patient examined, no change in status, stable for surgery.  I have reviewed the patient's chart and labs.  Questions were answered to the patient's satisfaction.     [name]

## 2023-03-22 NOTE — Brief Op Note (Signed)
 03/22/2023  4:20 PM  PATIENT:  Frank Carpenter  73 y.o. male  PRE-OPERATIVE DIAGNOSIS:  Left shoulder rotator cuff arthropathy  POST-OPERATIVE DIAGNOSIS:  Left shoulder rotator cuff arthropathy  PROCEDURE:  Procedure(s) with comments: REVERSE SHOULDER ARTHROPLASTY (Left) - interscalene block DePuy delta Xtend with NO subscap repair  SURGEON:  Surgeons and Role:    Beverely Low, MD - Primary  PHYSICIAN ASSISTANT:   ASSISTANTS: Thea Gist, PA-C   ANESTHESIA:   regional and general  EBL:  300 mL   BLOOD ADMINISTERED:none  DRAINS: none   LOCAL MEDICATIONS USED:  MARCAINE     SPECIMEN:  No Specimen  DISPOSITION OF SPECIMEN:  N/A  COUNTS:  YES  TOURNIQUET:  * No tourniquets in log *  DICTATION: .Other Dictation: Dictation Number [id number]  PLAN OF CARE: Admit for overnight observation  PATIENT DISPOSITION:  PACU - hemodynamically stable.   Delay start of Pharmacological VTE agent (>24hrs) due to surgical blood loss or risk of bleeding: not applicable

## 2023-03-22 NOTE — Anesthesia Procedure Notes (Signed)
 Anesthesia Regional Block: Interscalene brachial plexus block   Pre-Anesthetic Checklist: , timeout performed,  Correct Patient, Correct Site, Correct Laterality,  Correct Procedure, Correct Position, site marked,  Risks and benefits discussed,  Surgical consent,  Pre-op evaluation,  At surgeon's request and post-op pain management  Laterality: Left  Prep: chloraprep       Needles:  Injection technique: Single-shot  Needle Type: Echogenic Needle     Needle Length: 5cm  Needle Gauge: 21     Additional Needles:   Narrative:  Start time: 03/22/2023 12:38 PM End time: 03/22/2023 12:41 PM Injection made incrementally with aspirations every 5 mL.  Performed by: Personally  Anesthesiologist: Beryle Lathe, MD  Additional Notes: No pain on injection. No increased resistance to injection. Injection made in 5cc increments. Good needle visualization. Patient tolerated the procedure well.

## 2023-03-22 NOTE — Op Note (Unsigned)
 Frank Carpenter, Frank Carpenter MEDICAL RECORD NO: [id number] ACCOUNT NO: [id number] DATE OF BIRTH: 1950-11-01 FACILITY: [facility] LOCATION: [facility] PHYSICIAN: Almedia Balls. Ranell Patrick, MD  Operative Report   DATE OF PROCEDURE: 03/22/2023  PREOPERATIVE DIAGNOSIS:  Left shoulder end-stage rotator cuff tear arthropathy.  POSTOPERATIVE DIAGNOSIS:  Left shoulder end-stage rotator cuff tear arthropathy.  PROCEDURE PERFORMED:  Left reverse total shoulder arthroplasty using DePuy Delta Xtend prosthesis.  No subscapularis repair was done.  ATTENDING SURGEON:  Almedia Balls. Ranell Patrick, MD  ASSISTANT:  Konrad Felix Dixon, [place], who was scrubbed during the entire procedure and necessary for satisfactory completion of surgery.  ANESTHESIA:  General anesthesia was used plus interscalene block.  ESTIMATED BLOOD LOSS:  300 mL  FLUID REPLACEMENT:  1000 mL crystalloid  COUNTS:  Instrument count was correct.  COMPLICATIONS:  There were no complications.  ANTIBIOTICS:  Perioperative antibiotics were given.  INDICATIONS:  The patient is a 73 year old male who presents with a history of worsening left shoulder pain and dysfunction secondary to end-stage rotator cuff tear arthropathy.  Having failed conservative management, the patient presents for left  reverse total shoulder arthroplasty to eliminate pain and restore function.  Informed consent obtained.  DESCRIPTION OF PROCEDURE:  After an adequate level of anesthesia was achieved, the patient was positioned in the modified beach chair position.  The left shoulder was correctly identified and sterile prep and drape performed.  Timeout called verifying  correct patient and correct site.  We entered the patient's shoulder using a standard deltopectoral incision starting at the coracoid process and extending down to the anterior humerus, dissection down through subcutaneous tissues using Bovie.  Cephalic  vein identified and taken laterally with the deltoid.  Pectoralis was taken  medially.  The conjoint tendon identified and retracted medially.  Deep retractor was placed.  We tenodesed the biceps  in situ with 0 Vicryl figure-of-eight suture x2.  We then  placed tagged sutures in the subscapularis remnant, which was not repairable, but we did want to use it for retraction to protect the axillary nerve.  We released the inferior capsule, progressively externally rotating and extending the shoulder  delivering the humeral head out of the wound.  There was really no rotator cuff visible superiorly.  There may have been a little teres minor in the back, but certainly no infraspinatus.  We entered the proximal humerus with a 6 mm reamer, reaming up to  a size 12.  We then placed our 12 mm T-handle guide and resected the head of 20 degrees of retroversion with the oscillating saw.  Next, we used a rongeur to remove excess osteophytes off the humeral neck.  We then subluxed the humerus posteriorly,  gaining good exposure of the glenoid.  We removed the capsule, the labrum, and then went ahead and found our center point for our guide pin.  We placed a bicortical guide pin for reaming for the metaglene baseplate.  Once we had our guide pin in position  centered low, we reamed down to subchondral bone.  We then did a peripheral hand reaming with the T-handle reamer.  By hand, we then went ahead and did our central peg hole and we drilled that out under power.  We then irrigated and impacted the  HA-coated press-fit baseplate into position.  We placed a 48 screw inferiorly basically down the body of the scapula.  We placed a 36 screw at the base of the coracoid and then an 18 nonlocked posteriorly.  We locked superior and  inferior screws.  The  baseplate was secure.  We selected a 42 +0 standard glenosphere and attached that to the baseplate with a screwdriver.  Once the glenosphere was in position, we went back to the humeral side and reamed for the 2 left metaphysis.  We then trialed with  the  12 stem and the 2 left metaphysis set in the 0 setting and placed in 20 degrees of retroversion.  We reduced the shoulder with a 42 +3 poly.  We were happy with our soft tissue balancing and stability throughout our full arc of motion.  We removed all  the trial components.  We irrigated thoroughly.  We then used available bone graft in the humeral head.  We then used the HA-coated press-fit 12 stem and an HA size 2 left metaphysis set in the 0 setting and placed in 20 degrees of retroversion with the  impaction grafting technique using bone graft.  The stem was secured.  We selected the real 42 +3 poly, placed on the humeral tray and impacted that, reduced the shoulder, it had a nice little pop as it reduced.  We took the shoulder through a full range  of motion, no instability noted.  Good tension on the conjoint.  We irrigated thoroughly, resected the subscapularis remnant.  We went ahead and then closed the deltopectoral interval with 0 Vicryl suture followed by 2-0 Vicryl for subcutaneous closure  and 4-0 Monocryl for skin.  Steri-Strips were applied followed by a sterile dressing.  The patient tolerated the surgery well.   VAI D: 03/22/2023 4:24:59 pm T: 03/22/2023 9:15:00 pm  JOB: [id number]/ [id number]

## 2023-03-22 NOTE — Discharge Instructions (Signed)
 Ice to the shoulder constantly.  Keep the incision covered and clean and dry for one week, then ok to get it wet in the shower. May leave the incision uncovered and open to air after one week.   Do exercise as instructed several times per day.  DO NOT reach behind your back or push up out of a chair with the operative arm.  Use a sling while you are up and around for comfort, may remove while seated.  Keep pillow propped behind the operative elbow.  Follow up with Dr Ranell Patrick in two weeks in the office, call [phone] for appt  Please call Dr Ranell Patrick (cell) [phone] with any questions or concerns

## 2023-03-22 NOTE — Care Plan (Signed)
 Ortho Bundle Case Management Note  Patient Details  Name: Frank Carpenter MRN: [id number] Date of Birth: Dec 08, 1950                  Lt Reverse Shoulder Arthroplasty 03/22/23  DCP: Home with wife  DME: No needs  PT: [facility]P   DME Arranged:  N/A DME Agency:       Additional Comments: Please contact me with any questions of if this plan should need to change.  Ennis Forts, RN,CCM [facility]  [phone] 03/22/2023, 2:48 PM

## 2023-03-22 NOTE — Anesthesia Postprocedure Evaluation (Signed)
 Anesthesia Post Note  Patient: Frank Carpenter  Procedure(s) Performed: REVERSE SHOULDER ARTHROPLASTY (Left: Shoulder)     Patient location during evaluation: PACU Anesthesia Type: General Level of consciousness: sedated and patient cooperative Pain management: pain level controlled Vital Signs Assessment: post-procedure vital signs reviewed and stable Respiratory status: spontaneous breathing Cardiovascular status: stable Anesthetic complications: no  No notable events documented.  Last Vitals:  Vitals:   03/22/23 1843 03/22/23 1900  BP:  104/75  Pulse: 85 83  Resp:    Temp:    SpO2: 94% 91%    Last Pain:  Vitals:   03/22/23 1900  TempSrc:   PainSc: 0-No pain                 Lewie Loron

## 2023-03-22 NOTE — Transfer of Care (Signed)
 Immediate Anesthesia Transfer of Care Note  Patient: Theodis Shove III  Procedure(s) Performed: REVERSE SHOULDER ARTHROPLASTY (Left: Shoulder)  Patient Location: PACU  Anesthesia Type:General  Level of Consciousness: awake and alert   Airway & Oxygen Therapy: Patient Spontanous Breathing and Patient connected to face mask oxygen  Post-op Assessment: Report given to RN and Post -op Vital signs reviewed and stable  Post vital signs: Reviewed and stable  Last Vitals:  Vitals Value Taken Time  BP 130/86 03/22/23 1632  Temp    Pulse 85 03/22/23 1635  Resp 15 03/22/23 1635  SpO2 98 % 03/22/23 1635  Vitals shown include unfiled device data.  Last Pain:  Vitals:   03/22/23 1154  TempSrc:   PainSc: 0-No pain      Patients Stated Pain Goal: 5 (03/22/23 1137)  Complications: No notable events documented.

## 2023-03-22 NOTE — Anesthesia Procedure Notes (Signed)
 Procedure Name: Intubation Date/Time: 03/22/2023 2:53 PM  Performed by: Nelle Don, CRNAPre-anesthesia Checklist: Patient identified, Emergency Drugs available, Suction available and Patient being monitored Patient Re-evaluated:Patient Re-evaluated prior to induction Oxygen Delivery Method: Circle system utilized Preoxygenation: Pre-oxygenation with 100% oxygen Induction Type: IV induction Ventilation: Mask ventilation without difficulty Laryngoscope Size: Mac and 4 Grade View: Grade I Tube type: Oral Tube size: 8.0 mm Number of attempts: 1 Airway Equipment and Method: Stylet Placement Confirmation: ETT inserted through vocal cords under direct vision, positive ETCO2 and breath sounds checked- equal and bilateral Secured at: 23 cm Tube secured with: Tape Dental Injury: Teeth and Oropharynx as per pre-operative assessment

## 2023-03-23 ENCOUNTER — Encounter (HOSPITAL_COMMUNITY): Payer: Self-pay | Admitting: Orthopedic Surgery

## 2023-03-23 DIAGNOSIS — Z7901 Long term (current) use of anticoagulants: Secondary | ICD-10-CM | POA: Diagnosis not present

## 2023-03-23 DIAGNOSIS — I110 Hypertensive heart disease with heart failure: Secondary | ICD-10-CM | POA: Diagnosis not present

## 2023-03-23 DIAGNOSIS — I5022 Chronic systolic (congestive) heart failure: Secondary | ICD-10-CM | POA: Diagnosis not present

## 2023-03-23 DIAGNOSIS — Z87891 Personal history of nicotine dependence: Secondary | ICD-10-CM | POA: Diagnosis not present

## 2023-03-23 DIAGNOSIS — I482 Chronic atrial fibrillation, unspecified: Secondary | ICD-10-CM | POA: Diagnosis not present

## 2023-03-23 DIAGNOSIS — M12812 Other specific arthropathies, not elsewhere classified, left shoulder: Secondary | ICD-10-CM | POA: Diagnosis not present

## 2023-03-23 DIAGNOSIS — Z79899 Other long term (current) drug therapy: Secondary | ICD-10-CM | POA: Diagnosis not present

## 2023-03-23 LAB — BASIC METABOLIC PANEL
Anion gap: 8 (ref 5–15)
BUN: 21 mg/dL (ref 8–23)
CO2: 25 mmol/L (ref 22–32)
Calcium: 8.4 mg/dL — ABNORMAL LOW (ref 8.9–10.3)
Chloride: 98 mmol/L (ref 98–111)
Creatinine, Ser: 0.87 mg/dL (ref 0.61–1.24)
GFR, Estimated: >60 mL/min (ref >60)
Glucose, Bld: 116 mg/dL — ABNORMAL HIGH (ref 70–99)
Potassium: 4.3 mmol/L (ref 3.5–5.1)
Sodium: 131 mmol/L — ABNORMAL LOW (ref 135–145)

## 2023-03-23 LAB — HEMOGLOBIN AND HEMATOCRIT, BLOOD
HCT: 50.6 % (ref 39.0–52.0)
Hemoglobin: 16.7 g/dL (ref 13.0–17.0)

## 2023-03-23 NOTE — Discharge Summary (Signed)
 In most cases prophylactic antibiotics for Dental procdeures after total joint surgery are not necessary.  Exceptions are as follows:  1. History of prior total joint infection  2. Severely immunocompromised (Organ Transplant, cancer chemotherapy, Rheumatoid biologic meds such as Humera)  3. Poorly controlled diabetes (A1C &gt; 8.0, blood glucose over 200)  If you have one of these conditions, contact your surgeon for an antibiotic prescription, prior to your dental procedure. Orthopedic Discharge Summary        Physician Discharge Summary  Patient ID: Frank Carpenter MRN: [id number] DOB/AGE: 1950-04-04 73 y.o.  Admit date: 03/22/2023 Discharge date: 03/23/2023   Procedures:  Procedure(s) (LRB): REVERSE SHOULDER ARTHROPLASTY (Left)  Attending Physician:  Dr. Malon Kindle  Admission Diagnoses:   Left shoulder end stage rotator cuff tear arthropathy  Discharge Diagnoses:  same   Past Medical History:  Diagnosis Date   Arthritis    Atrial fibrillation, chronic (HCC)    Chronic systolic (congestive) heart failure (HCC)    Dysrhythmia    A.fib   High cholesterol    Hypertension    OSA (obstructive sleep apnea)    Testosterone deficiency     PCP: Jarrett Soho, PA-C   Discharged Condition: good  Hospital Course:  Patient underwent the above stated procedure on 03/22/2023. Patient tolerated the procedure well and brought to the recovery room in good condition and subsequently to the floor. Patient had an uncomplicated hospital course and was stable for discharge.   Disposition: Discharge disposition: 01-Home or Self Care      with follow up in 2 weeks    Follow-up Information     Beverely Low, MD. Schedule an appointment as soon as possible for a visit in 2 week(s).   Specialty: Orthopedic Surgery Why: [phone] Contact information: [address] [address] [place] [address] [phone]         Beverely Low, MD. Call in 2  week(s).   Specialty: Orthopedic Surgery Why: please call [phone] for appt Contact information: [address] [place] [address] [place] [address] [phone]                 Dental Antibiotics:  In most cases prophylactic antibiotics for Dental procdeures after total joint surgery are not necessary.  Exceptions are as follows:  1. History of prior total joint infection  2. Severely immunocompromised (Organ Transplant, cancer chemotherapy, Rheumatoid biologic meds such as Humera)  3. Poorly controlled diabetes (A1C &gt; 8.0, blood glucose over 200)  If you have one of these conditions, contact your surgeon for an antibiotic prescription, prior to your dental procedure.  Discharge Instructions     Call MD / Call 911   Complete by: As directed    If you experience chest pain or shortness of breath, CALL 911 and be transported to the hospital emergency room.  If you develope a fever above 101 F, pus (white drainage) or increased drainage or redness at the wound, or calf pain, call your surgeon's office.   Constipation Prevention   Complete by: As directed    Drink plenty of fluids.  Prune juice may be helpful.  You may use a stool softener, such as Colace (over the counter) 100 mg twice a day.  Use MiraLax (over the counter) for constipation as needed.   Diet - low sodium heart healthy   Complete by: As directed    Increase activity slowly as tolerated   Complete by: As directed    Post-operative opioid  taper instructions:   Complete by: As directed    POST-OPERATIVE OPIOID TAPER INSTRUCTIONS: It is important to wean off of your opioid medication as soon as possible. If you do not need pain medication after your surgery it is ok to stop day one. Opioids include: Codeine, Hydrocodone(Norco, Vicodin), Oxycodone(Percocet, oxycontin) and hydromorphone amongst others.  Long term and even short term use of opiods can cause: Increased pain  response Dependence Constipation Depression Respiratory depression And more.  Withdrawal symptoms can include Flu like symptoms Nausea, vomiting And more Techniques to manage these symptoms Hydrate well Eat regular healthy meals Stay active Use relaxation techniques(deep breathing, meditating, yoga) Do Not substitute Alcohol to help with tapering If you have been on opioids for less than two weeks and do not have pain than it is ok to stop all together.  Plan to wean off of opioids This plan should start within one week post op of your joint replacement. Maintain the same interval or time between taking each dose and first decrease the dose.  Cut the total daily intake of opioids by one tablet each day Next start to increase the time between doses. The last dose that should be eliminated is the evening dose.          Allergies as of 03/23/2023       Reactions   Ezetimibe    Sick feeling   Statins    Fatigue        Medication List     STOP taking these medications    acetaminophen 500 MG tablet Commonly known as: TYLENOL       TAKE these medications    aspirin-sod bicarb-citric acid 325 MG Tbef tablet Commonly known as: ALKA-SELTZER Take 650 mg by mouth every 6 (six) hours as needed (indigestion).   dapagliflozin propanediol 10 MG Tabs tablet Commonly known as: FARXIGA Take 1 tablet (10 mg total) by mouth daily.   docusate sodium 100 MG capsule Commonly known as: COLACE Take 200 mg by mouth daily.   Eliquis 5 MG Tabs tablet Generic drug: apixaban TAKE 1 TABLET BY MOUTH TWICE A DAY   Entresto 24-26 MG Generic drug: sacubitril-valsartan TAKE 1 TABLET BY MOUTH TWICE A DAY   furosemide 40 MG tablet Commonly known as: LASIX TAKE 1 TABLET BY MOUTH EVERY DAY   hydrOXYzine 25 MG tablet Commonly known as: ATARAX Take 25 mg by mouth every 8 (eight) hours as needed for anxiety.   methadone 10 MG/ML solution Commonly known as: DOLOPHINE Take 150 mg  by mouth daily.   methocarbamol 500 MG tablet Commonly known as: ROBAXIN Take 1 tablet (500 mg total) by mouth every 8 (eight) hours as needed for muscle spasms.   metoprolol tartrate 50 MG tablet Commonly known as: LOPRESSOR TAKE 1 TABLET BY MOUTH TWICE A DAY   mirtazapine 15 MG tablet Commonly known as: REMERON Take 15 mg by mouth at bedtime as needed (sleep).   multivitamin capsule Take 1 capsule by mouth daily.   oxyCODONE-acetaminophen 5-325 MG tablet Commonly known as: Percocet Take 1 tablet by mouth every 4 (four) hours as needed for severe pain (pain score 7-10).   spironolactone 25 MG tablet Commonly known as: ALDACTONE Take 0.5 tablets (12.5 mg total) by mouth daily.   testosterone cypionate 200 MG/ML injection Commonly known as: DEPOTESTOSTERONE CYPIONATE Inject 100 mg into the muscle once a week. Thursday          Signed: [name] 03/23/2023, 7:50 AM  [facility]  is now [facility]  [place] [address], [address], [place], [place] [address] Phone: [phone] Facebook  Instagram  [employer]

## 2023-03-23 NOTE — Evaluation (Signed)
 Occupational Therapy Evaluation Patient Details Name: Frank Carpenter MRN: [id number] DOB: 1950/04/08 Today's Date: 03/23/2023   History of Present Illness   Pt with h/o Left shoulder end-stage rotator cuff tear arthropathy.  On 03/22/23, pt underwent Left reverse total shoulder arthroplasty under Dr. Ranell Patrick.     Clinical Impressions Pt is a 73 year old male, s/p reverse LT shoulder replacement without functional use of non- dominant upper extremity secondary to effects of surgery and interscalene block and shoulder precautions. Therapist provided education and instruction to patient and spouse in regards to exercises, precautions, positioning, donning upper extremity clothing and bathing while maintaining shoulder precautions, ice and edema management and donning/doffing sling. Patient and spouse verbalized understanding and demonstrated as needed. Patient needed assistance to donn shirt, underwear, pants, socks and shoes and provided with instruction on compensatory strategies to perform ADLs. Patient limited by decreased ROM in LT shoulder so therefore will need some form of assistance at home. Patient and spouse verbalized and/or demonstrated understanding to all instruction. Patient to follow up with MD for further therapy needs.       If plan is discharge home, recommend the following:   A little help with bathing/dressing/bathroom;Assistance with cooking/housework;Assist for transportation     Functional Status Assessment   Patient has had a recent decline in their functional status and demonstrates the ability to make significant improvements in function in a reasonable and predictable amount of time.     Equipment Recommendations   None recommended by OT     Recommendations for Other Services         Precautions/Restrictions   Precautions Precautions: Shoulder Shoulder Interventions: Shoulder sling/immobilizer Precaution Booklet Issued: Yes  (comment) Precaution/Restrictions Comments: Sling at all times except ADL/exerciseYes. Non weight bearing Yes. AROM elbow, wrist and hand to tolerance: Yes. PROM of shoulder: No. AROM of shoulder: No Required Braces or Orthoses: Sling Restrictions Weight Bearing Restrictions Per Provider Order: Yes LUE Weight Bearing Per Provider Order: Non weight bearing     Mobility Bed Mobility Overal bed mobility: Modified Independent                  Transfers Overall transfer level: Independent                        Balance Overall balance assessment: Independent                                         ADL either performed or assessed with clinical judgement   ADL      General ADL Comments:  Per orders, LT shoulder parameters as follows for ADL tasks: No shoulder ROM; elbow/wrist/hand ROM only.   While moving within specified parameters, pt/caregiver instructed on bathing and how to donn/doff shirt, placing operative arm through sleeve first when donning and off last when doffing.  Pt/caregiver educated on compensatory strategies for LB ADL and strategies to reduce risk of falls.  Pt/caregiver educated on donning/doffing sling and to wear the sling at all times with the exception of ADL, and to loosen the neck strap of the sling when the operative arm is in a supported position when sitting. In sitting or supine, pt instructed to have a pillow behind and under their operative arm to provide support.   If assist needed with ambulation, caregiver educated on the importance of walking on pt's non-operative side.  Education regarding use of Ice pack and Cold Therapy completed, including the importance of avoiding heat until cleared by surgical team.  Pt/caregiver verbalized/demonstrated understanding. Teach Back used while caregiver assisted with dressing pt and positioning of sling to facilitate DC.      Vision Baseline Vision/History: 0 No visual deficits        Perception Perception: Within Functional Limits       Praxis Praxis: WFL       Pertinent Vitals/Pain Pain Assessment Pain Assessment: Faces Faces Pain Scale: Hurts little more Pain Location: Headache. Pt declined nurse notification for HA meds. Pt denied shoulder pain, still mostly numb. Pain Intervention(s): Limited activity within patient's tolerance, Monitored during session     Extremity/Trunk Assessment Upper Extremity Assessment Upper Extremity Assessment: Right hand dominant;LUE deficits/detail LUE Deficits / Details: Moving hand and wrist but still "numb" LUE: Unable to fully assess due to immobilization   Lower Extremity Assessment Lower Extremity Assessment: Overall WFL for tasks assessed   Cervical / Trunk Assessment Cervical / Trunk Assessment: Normal   Communication Communication Communication: No apparent difficulties   Cognition Arousal: Alert Behavior During Therapy: WFL for tasks assessed/performed Cognition: No apparent impairments                               Following commands: Intact       Cueing  General Comments          Exercises     Shoulder Instructions Shoulder Instructions Donning/doffing shirt without moving shoulder: Minimal assistance;Caregiver independent with task;Patient able to independently direct caregiver Method for sponge bathing under operated UE: Minimal assistance;Caregiver independent with task;Patient able to independently direct caregiver Donning/doffing sling/immobilizer: Moderate assistance;Caregiver independent with task;Patient able to independently direct caregiver Correct positioning of sling/immobilizer: Independent;Caregiver independent with task;Patient able to independently direct caregiver ROM for elbow, wrist and digits of operated UE:  (Pt demo'd on RT UE to show understanding) Sling wearing schedule (on at all times/off for ADL's): Independent;Caregiver independent with task;Patient  able to independently direct caregiver Proper positioning of operated UE when showering: Independent;Caregiver independent with task;Patient able to independently direct caregiver Positioning of UE while sleeping: Set-up;Caregiver independent with task;Patient able to independently direct caregiver    Home Living Family/patient expects to be discharged to:: Private residence Living Arrangements: Spouse/significant other Available Help at Discharge: Available 24 hours/day Type of Home: House Home Access: Level entry     Home Layout: Two level Alternate Level Stairs-Number of Steps: Full flight Alternate Level Stairs-Rails: Left Bathroom Shower/Tub: Walk-in shower;Tub/shower unit   Bathroom Toilet: Handicapped height     Home Equipment: Grab bars - tub/shower;Hand held shower head;Shower seat - built in   Additional Comments: CPAP      Prior Functioning/Environment Prior Level of Function : Independent/Modified Independent;Driving             Mobility Comments: Last week I did 6 miles on the tredmill.      OT Problem List: Impaired sensation;Increased edema;Decreased range of motion;Decreased strength;Impaired UE functional use   OT Treatment/Interventions:        OT Goals(Current goals can be found in the care plan section)   Acute Rehab OT Goals OT Goal Formulation: All assessment and education complete, DC therapy ADL Goals Pt/caregiver will Perform Home Exercise Program: With written HEP provided (Post-op hand,wrist,forearm and shoulder protocol.) Additional ADL Goal #1: Pt and spouse will demonstrate UE/LE dressing, donning/doffing of sling, correct positioning of LT  UE, and compensatory strategies for LT axilla hygiene, as well as use of Ice, while correctly following all shoulder post-op precautions/restrictions.   OT Frequency:       Co-evaluation              AM-PAC OT "6 Clicks" Daily Activity     Outcome Measure Help from another person eating  meals?: A Little Help from another person taking care of personal grooming?: A Little Help from another person toileting, which includes using toliet, bedpan, or urinal?: A Little Help from another person bathing (including washing, rinsing, drying)?: A Little Help from another person to put on and taking off regular upper body clothing?: A Little Help from another person to put on and taking off regular lower body clothing?: A Little 6 Click Score: 18   End of Session Equipment Utilized During Treatment: Other (comment) (sling) Nurse Communication: Other (comment) (OT completed and pt ready for home from OT perspective.)  Activity Tolerance: Patient tolerated treatment well Patient left: with family/visitor present;Other (comment) (sitting EOB)  OT Visit Diagnosis:  (decreased ADLs)                Time: 8657-8469 OT Time Calculation (min): 33 min Charges:  OT General Charges $OT Visit: 1 Visit OT Evaluation $OT Eval Low Complexity: 1 Low OT Treatments $Self Care/Home Management : 8-22 mins  Victorino Dike, OT Acute Rehab Services Office: [phone] 03/23/2023   Theodoro Clock 03/23/2023, 12:05 PM

## 2023-03-23 NOTE — Care Management Obs Status (Signed)
 MEDICARE OBSERVATION STATUS NOTIFICATION   Patient Details  Name: Frank Carpenter MRN: [id number] Date of Birth: 1950/09/12   Medicare Observation Status Notification Given:  Yes    Howell Rucks, RN 03/23/2023, 10:08 AM

## 2023-03-23 NOTE — TOC Transition Note (Signed)
 Transition of Care [facility]) - Discharge Note   Patient Details  Name: Frank Carpenter MRN: [id number] Date of Birth: 1951/01/01  Transition of Care [facility]) CM/SW Contact:  Howell Rucks, RN Phone Number: 03/23/2023, 11:08 AM   Clinical Narrative:  Met with pt at bedside to introduce role of TOC/NCM and review for dc planning, pt confirmed HEP, no home DME needs. MOON completed. No TOC needs.    Final next level of care: Home/Self Care Barriers to Discharge: No Barriers Identified   Patient Goals and CMS Choice Patient states their goals for this hospitalization and ongoing recovery are:: return home          Discharge Placement                       Discharge Plan and Services Additional resources added to the After Visit Summary for                  DME Arranged: N/A                    Social Drivers of Health (SDOH) Interventions SDOH Screenings   Food Insecurity: No Food Insecurity (03/22/2023)  Housing: Low Risk  (03/22/2023)  Transportation Needs: No Transportation Needs (03/22/2023)  Utilities: Not At Risk (03/22/2023)  Social Connections: Socially Integrated (03/22/2023)  Tobacco Use: Medium Risk (03/22/2023)     Readmission Risk Interventions     No data to display

## 2023-03-23 NOTE — Progress Notes (Signed)
 Orthopedics Progress Note  Subjective: Patient feeling better today. Pain controlled  Objective:  Vitals:   03/23/23 0128 03/23/23 0553  BP: (!) 113/97 97/66  Pulse: 66 76  Resp: 18 18  Temp: 98.6 F (37 C) 98 F (36.7 C)  SpO2: 92% 95%    General: Awake and alert  Musculoskeletal: Left shoulder fairly swollen to mid arm. No active bleeding at the incision site Dressing changed. Wiggles fingers Neurovascularly intact  Lab Results  Component Value Date   WBC 8.8 03/16/2023   HGB 16.7 03/23/2023   HCT 50.6 03/23/2023   MCV 98.2 03/16/2023   PLT 185 03/16/2023       Component Value Date/Time   NA 131 (L) 03/23/2023 0351   NA 136 07/22/2022 1030   K 4.3 03/23/2023 0351   CL 98 03/23/2023 0351   CO2 25 03/23/2023 0351   GLUCOSE 116 (H) 03/23/2023 0351   BUN 21 03/23/2023 0351   BUN 17 07/22/2022 1030   CREATININE 0.87 03/23/2023 0351   CALCIUM 8.4 (L) 03/23/2023 0351   GFRNONAA >60 03/23/2023 0351    Lab Results  Component Value Date   INR 1.3 (H) 02/23/2022    Assessment/Plan: POD #1 s/p Procedure(s): REVERSE SHOULDER ARTHROPLASTY Stable this AM Discharge to home after OT Follow up in two weeks in the office  Viviann Spare R. Ranell Patrick, MD 03/23/2023 7:49 AM

## 2023-03-30 ENCOUNTER — Other Ambulatory Visit: Payer: Self-pay | Admitting: Cardiovascular Disease

## 2023-03-31 ENCOUNTER — Telehealth: Payer: Self-pay | Admitting: Cardiovascular Disease

## 2023-03-31 MED ORDER — DAPAGLIFLOZIN PROPANEDIOL 10 MG PO TABS: 10.0000 mg | ORAL_TABLET | Freq: Every day | ORAL | 2 refills | Status: DC

## 2023-03-31 NOTE — Telephone Encounter (Signed)
 Pt's medication was sent to pt's pharmacy as requested. Confirmation received.

## 2023-03-31 NOTE — Telephone Encounter (Signed)
*  STAT* If patient is at the pharmacy, call can be transferred to refill team.   1. Which medications need to be refilled? (please list name of each medication and dose if known)  dapagliflozin propanediol (FARXIGA) 10 MG TABS tablet  2. Which pharmacy/location (including street and city if local pharmacy) is medication to be sent to? [id number] [place], [place] - [address]    3. Do they need a 30 day or 90 day supply?   30 day supply  Patient's wife states patient is completely out of medication.

## 2023-04-03 ENCOUNTER — Other Ambulatory Visit (HOSPITAL_COMMUNITY): Payer: Self-pay

## 2023-04-03 ENCOUNTER — Encounter: Payer: Self-pay | Admitting: Pharmacy Technician

## 2023-04-03 ENCOUNTER — Telehealth: Payer: Self-pay | Admitting: Pharmacy Technician

## 2023-04-03 ENCOUNTER — Telehealth: Payer: Self-pay | Admitting: Pharmacist

## 2023-04-03 NOTE — Telephone Encounter (Signed)
 Patient wife called stating pt healthwell grant will expire 3/22. Asking if we can please renew- for [facility] and Farxiga

## 2023-04-03 NOTE — Telephone Encounter (Signed)
 Got grant. Gave pharmacy grant for next fill. Sent pt a Clinical cytogeneticist

## 2023-04-03 NOTE — Telephone Encounter (Signed)
 Hello! You have been approved for a [employer] grant to help cover the copay cost on your entresto/farxiga. The grant information has been given to [employer] and they are instructed to bill your insurance as primary and the grant as secondary for next fill. Here is a copy of the [employer] information for your records.    BIN: [id number] PCN: [id number] GROUP: [id number] ID: [id number]

## 2023-04-05 DIAGNOSIS — M25512 Pain in left shoulder: Secondary | ICD-10-CM | POA: Diagnosis not present

## 2023-04-05 DIAGNOSIS — K0889 Other specified disorders of teeth and supporting structures: Secondary | ICD-10-CM | POA: Diagnosis not present

## 2023-04-05 DIAGNOSIS — Z4789 Encounter for other orthopedic aftercare: Secondary | ICD-10-CM | POA: Diagnosis not present

## 2023-04-12 ENCOUNTER — Ambulatory Visit: Payer: Medicare Other | Attending: Nurse Practitioner | Admitting: Nurse Practitioner

## 2023-04-12 ENCOUNTER — Telehealth: Payer: Self-pay

## 2023-04-12 ENCOUNTER — Encounter: Payer: Self-pay | Admitting: Nurse Practitioner

## 2023-04-12 VITALS — BP 124/80 | HR 74 | Ht 71.0 in | Wt 219.0 lb

## 2023-04-12 DIAGNOSIS — I77810 Thoracic aortic ectasia: Secondary | ICD-10-CM

## 2023-04-12 DIAGNOSIS — E785 Hyperlipidemia, unspecified: Secondary | ICD-10-CM | POA: Diagnosis not present

## 2023-04-12 DIAGNOSIS — I5032 Chronic diastolic (congestive) heart failure: Secondary | ICD-10-CM

## 2023-04-12 DIAGNOSIS — I10 Essential (primary) hypertension: Secondary | ICD-10-CM | POA: Diagnosis not present

## 2023-04-12 DIAGNOSIS — G4733 Obstructive sleep apnea (adult) (pediatric): Secondary | ICD-10-CM | POA: Diagnosis not present

## 2023-04-12 DIAGNOSIS — I2510 Atherosclerotic heart disease of native coronary artery without angina pectoris: Secondary | ICD-10-CM | POA: Diagnosis not present

## 2023-04-12 DIAGNOSIS — I480 Paroxysmal atrial fibrillation: Secondary | ICD-10-CM | POA: Diagnosis not present

## 2023-04-12 NOTE — Telephone Encounter (Signed)
 Pt was seen in office today. Pt may have to complete a fasting lipid panel & CMET if these labs haven't been completed. Waiting on a return call to discuss where pt completed lab work in 12/2022. Called pts PCP office and they didn't have any recent lab results.

## 2023-04-12 NOTE — Telephone Encounter (Signed)
 Pt returned call. Spoke with pt. Pt is going to complete lab work requested at his appointment since he hasn't had lab work in a while. Lab orders placed and mailed to pt.

## 2023-04-12 NOTE — Patient Instructions (Addendum)
 Medication Instructions:  Your physician recommends that you continue on your current medications as directed. Please refer to the Current Medication list given to you today.  *If you need a refill on your cardiac medications before your next appointment, please call your pharmacy*   Lab Work: CBC & Fasting Lipid panel at your convenience.    Testing/Procedures: NONE ordered at this time of appointment   Follow-Up: At [facility], you and your health needs are our priority.  As part of our continuing mission to provide you with exceptional heart care, we have created designated Provider Care Teams.  These Care Teams include your primary Cardiologist (physician) and Advanced Practice Providers (APPs -  Physician Assistants and Nurse Practitioners) who all work together to provide you with the care you need, when you need it.  We recommend signing up for the patient portal called "MyChart".  Sign up information is provided on this After Visit Summary.  MyChart is used to connect with patients for Virtual Visits (Telemedicine).  Patients are able to view lab/test results, encounter notes, upcoming appointments, etc.  Non-urgent messages can be sent to your provider as well.   To learn more about what you can do with MyChart, go to [email or web address].    Your next appointment:   August 2025    Provider:   Nanetta Batty, MD     Other Instructions   1st Floor: - Lobby - Registration  - Pharmacy  - Lab - Cafe  2nd Floor: - PV Lab - Diagnostic Testing (echo, CT, nuclear med)  3rd Floor: - Vacant  4th Floor: - TCTS (cardiothoracic surgery) - AFib Clinic - Structural Heart Clinic - Vascular Surgery  - Vascular Ultrasound  5th Floor: - HeartCare Cardiology (general and EP) - Clinical Pharmacy for coumadin, hypertension, lipid, weight-loss medications, and med management appointments    Valet parking services will be available as well.

## 2023-04-12 NOTE — Progress Notes (Signed)
 Office Visit    Patient Name: Frank Carpenter Date of Encounter: 04/12/2023  Primary Care Provider:  Jarrett Soho, PA-C Primary Cardiologist:  Nanetta Batty, MD  Chief Complaint    73 year old male with a history of coronary artery calcification, chronic systolic heart failure with subsequent normalization of EF, paroxysmal atrial fibrillation, dilation of the ascending aorta, hypertension, hyperlipidemia, and OSA who presents for follow-up related to atrial fibrillation and heart failure.   Past Medical History    Past Medical History:  Diagnosis Date   Arthritis    Atrial fibrillation, chronic (HCC)    Chronic systolic (congestive) heart failure (HCC)    Dysrhythmia    A.fib   High cholesterol    Hypertension    OSA (obstructive sleep apnea)    Testosterone deficiency    Past Surgical History:  Procedure Laterality Date   ATRIAL FIBRILLATION ABLATION N/A 08/04/2022   Procedure: ATRIAL FIBRILLATION ABLATION;  Surgeon: Maurice Small, MD;  Location: [facility] INVASIVE CV LAB;  Service: Cardiovascular;  Laterality: N/A;   CARDIOVERSION N/A 02/24/2022   Procedure: CARDIOVERSION;  Surgeon: Wendall Stade, MD;  Location: [facility] ENDOSCOPY;  Service: Cardiovascular;  Laterality: N/A;   CARDIOVERSION N/A 03/30/2022   Procedure: CARDIOVERSION;  Surgeon: Meriam Sprague, MD;  Location: [facility] ENDOSCOPY;  Service: Cardiovascular;  Laterality: N/A;   CARDIOVERSION N/A 05/12/2022   Procedure: CARDIOVERSION;  Surgeon: Thomasene Ripple, DO;  Location: [facility] INVASIVE CV LAB;  Service: Cardiovascular;  Laterality: N/A;   NASAL SEPTUM SURGERY  04/2021   REVERSE SHOULDER ARTHROPLASTY Left 03/22/2023   Procedure: REVERSE SHOULDER ARTHROPLASTY;  Surgeon: Beverely Low, MD;  Location: [facility] ORS;  Service: Orthopedics;  Laterality: Left;  interscalene block   TONSILLECTOMY     TRACHEOSTOMY  1952   at age 45 d/t croup, respiratory distress    Allergies  Allergies  Allergen Reactions   Ezetimibe      Sick feeling   Statins     Fatigue     Labs/Other Studies Reviewed    The following studies were reviewed today:  Cardiac Studies & Procedures   ______________________________________________________________________________________________     ECHOCARDIOGRAM  ECHOCARDIOGRAM COMPLETE 11/08/2022  Narrative ECHOCARDIOGRAM REPORT    Patient Name:   Frank Carpenter Carpenter Date of Exam: 11/08/2022 Medical Rec #:  [id number]       Height:       71.0 in Accession #:    [id number]      Weight:       229.0 lb Date of Birth:  1950/03/15       BSA:          2.233 m Patient Age:    71 years        BP:           106/70 mmHg Patient Gender: M               HR:           88 bpm. Exam Location:  [address]  Procedure: 2D Echo, Cardiac Doppler and Color Doppler  Indications:    I51.9 LV dysfunction  History:        Patient has prior history of Echocardiogram examinations, most recent 04/18/2022. LV dysfunction, Dilated ascending aorta, Arrythmias:Atrial Fibrillation; Risk Factors:Hypertension, Obesity and Former Smoker.  Sonographer:    Samule Ohm RDCS Referring Phys: [phone] JONATHAN J BERRY   Sonographer Comments: Technically difficult study due to poor echo windows and patient is obese. Image acquisition challenging due to  patient body habitus and Image acquisition challenging due to respiratory motion. IMPRESSIONS   1. Left ventricular ejection fraction, by estimation, is 55 to 60%. Left ventricular ejection fraction by PLAX is 57 %. The left ventricle has normal function. The left ventricle has no regional wall motion abnormalities. There is mild concentric left ventricular hypertrophy. Left ventricular diastolic parameters are consistent with Grade I diastolic dysfunction (impaired relaxation). 2. Right ventricular systolic function is normal. The right ventricular size is normal. There is normal pulmonary artery systolic pressure. 3. The mitral valve is normal in structure. No  evidence of mitral valve regurgitation. No evidence of mitral stenosis. 4. The aortic valve is tricuspid. Aortic valve regurgitation is not visualized. No aortic stenosis is present. 5. Aortic dilatation noted. There is mild dilatation of the ascending aorta, measuring 42 mm. 6. The inferior vena cava is normal in size with greater than 50% respiratory variability, suggesting right atrial pressure of 3 mmHg.  FINDINGS Left Ventricle: Left ventricular ejection fraction, by estimation, is 55 to 60%. Left ventricular ejection fraction by PLAX is 57 %. The left ventricle has normal function. The left ventricle has no regional wall motion abnormalities. The left ventricular internal cavity size was normal in size. There is mild concentric left ventricular hypertrophy. Left ventricular diastolic parameters are consistent with Grade I diastolic dysfunction (impaired relaxation). Indeterminate filling pressures.  Right Ventricle: The right ventricular size is normal. No increase in right ventricular wall thickness. Right ventricular systolic function is normal. There is normal pulmonary artery systolic pressure. The tricuspid regurgitant velocity is 2.39 m/s, and with an assumed right atrial pressure of 3 mmHg, the estimated right ventricular systolic pressure is 25.8 mmHg.  Left Atrium: Left atrial size was normal in size.  Right Atrium: Right atrial size was normal in size.  Pericardium: There is no evidence of pericardial effusion.  Mitral Valve: The mitral valve is normal in structure. No evidence of mitral valve regurgitation. No evidence of mitral valve stenosis.  Tricuspid Valve: The tricuspid valve is normal in structure. Tricuspid valve regurgitation is trivial. No evidence of tricuspid stenosis.  Aortic Valve: The aortic valve is tricuspid. Aortic valve regurgitation is not visualized. No aortic stenosis is present.  Pulmonic Valve: The pulmonic valve was normal in structure. Pulmonic valve  regurgitation is trivial. No evidence of pulmonic stenosis.  Aorta: Aortic dilatation noted. There is mild dilatation of the ascending aorta, measuring 42 mm.  Venous: The inferior vena cava is normal in size with greater than 50% respiratory variability, suggesting right atrial pressure of 3 mmHg.  IAS/Shunts: No atrial level shunt detected by color flow Doppler.   LEFT VENTRICLE PLAX 2D LV EF:         Left            Diastology ventricular     LV e' medial:    7.51 cm/s ejection        LV E/e' medial:  10.2 fraction by     LV e' lateral:   9.03 cm/s PLAX is 57      LV E/e' lateral: 8.5 %. LVIDd:         5.00 cm LVIDs:         3.50 cm LV PW:         1.30 cm LV IVS:        1.30 cm LVOT diam:     2.20 cm LV SV:         77 LV SV Index:  35 LVOT Area:     3.80 cm   RIGHT VENTRICLE             IVC RV S prime:     16.30 cm/s  IVC diam: 1.40 cm TAPSE (M-mode): 2.3 cm RVSP:           25.8 mmHg  LEFT ATRIUM             Index        RIGHT ATRIUM           Index LA diam:        4.90 cm 2.19 cm/m   RA Pressure: 3.00 mmHg LA Vol (A2C):   63.2 ml 28.30 ml/m  RA Area:     17.20 cm LA Vol (A4C):   64.5 ml 28.88 ml/m  RA Volume:   51.30 ml  22.97 ml/m LA Biplane Vol: 65.0 ml 29.10 ml/m AORTIC VALVE LVOT Vmax:   94.20 cm/s LVOT Vmean:  64.500 cm/s LVOT VTI:    0.203 m  AORTA Ao Root diam: 4.10 cm Ao Asc diam:  4.20 cm  MITRAL VALVE               TRICUSPID VALVE MV Area (PHT): 3.17 cm    TR Peak grad:   22.8 mmHg MV Decel Time: 239 msec    TR Vmax:        239.00 cm/s MV E velocity: 76.50 cm/s  Estimated RAP:  3.00 mmHg MV A velocity: 89.10 cm/s  RVSP:           25.8 mmHg MV E/A ratio:  0.86 SHUNTS Systemic VTI:  0.20 m Systemic Diam: 2.20 cm  Chilton Si MD Electronically signed by Chilton Si MD Signature Date/Time: 11/08/2022/3:24:59 PM    Final      CT SCANS  CT CARDIAC SCORING (SELF PAY ONLY) 06/26/2019  Addendum 06/26/2019  5:12 PM ADDENDUM  REPORT: 06/26/2019 17:10  CLINICAL DATA:  Risk stratification  EXAM: Coronary Calcium Score  TECHNIQUE: The patient was scanned on a Siemens Somatom 64 slice scanner. Axial non-contrast 3 mm slices were carried out through the heart. The data set was analyzed on a dedicated work station and scored using the Agatson method.  FINDINGS: Non-cardiac: See separate report from [facility].  Ascending aorta: Mild aortic root dilatation 4.1 cm  Pericardium: Normal  Coronary arteries: No coronary calcium noted  IMPRESSION: Coronary calcium score of 0.  Suggest f/u CTA of aorta in 6 months  Charlton Haws   Electronically Signed By: Charlton Haws M.D. On: 06/26/2019 17:10  Narrative EXAM: OVER-READ INTERPRETATION  CT CHEST  The following report is an over-read performed by radiologist Dr. Trudie Reed of [facility] on 06/26/2019. This over-read does not include interpretation of cardiac or coronary anatomy or pathology. The coronary calcium score interpretation by the cardiologist is attached.  COMPARISON:  None.  FINDINGS: Within the visualized portions of the thorax there are no suspicious appearing pulmonary nodules or masses, there is no acute consolidative airspace disease, no pleural effusions, no pneumothorax and no lymphadenopathy. Visualized portions of the upper abdomen are unremarkable. There are no aggressive appearing lytic or blastic lesions noted in the visualized portions of the skeleton.  IMPRESSION: 1. No significant incidental noncardiac findings are noted.  Electronically Signed: By: Trudie Reed M.D. On: 06/26/2019 13:12     ______________________________________________________________________________________________     Recent Labs: 03/16/2023: ALT 33; Platelets 185 03/23/2023: BUN 21; Creatinine, Ser 0.87; Hemoglobin 16.7; Potassium 4.3;  Sodium 131  Recent Lipid Panel    Component Value Date/Time   CHOL 151  01/15/2022 1847   TRIG 81 01/15/2022 1847   HDL 44 01/15/2022 1847   CHOLHDL 3.4 01/15/2022 1847   VLDL 16 01/15/2022 1847   LDLCALC 91 01/15/2022 1847    History of Present Illness    73 year old male with the above past medical history including coronary artery calcification, chronic systolic heart failure with subsequent normalization of EF, paroxysmal atrial fibrillation, dilation of the ascending aorta, hypertension, hyperlipidemia, and OSA.   Coronary calcium score in 2021 was 0.  Echocardiogram in 2021 showed normal LV function.  He has a history of persistent atrial fibrillation s/p multiple DCCVs, most recently in 04/2022.  He was hospitalized in December 2023 in the setting of atrial fibrillation with RVR, acute systolic heart failure.  Echocardiogram in 03/2022 revealed EF 35 to 40%, severe left atrial dilation.  Cardiac CT in 07/2022 revealed mild dilation of ascending aorta measuring 41 to 43 mm, no LAA thrombus, mild coronary artery calcifications.  He underwent atrial fibrillation ablation in 07/2022.  Repeat echocardiogram in 10/2022 showed EF 55 to 60%, normal LV function, no RWMA, mild concentric LVH, G1 DD, no significant valvular abnormalities, mild dilation of the ascending aorta measuring 42 mm.  He was last seen in the office on 12/26/2022 and was stable from a cardiac standpoint.  He was cleared for left reverse total shoulder arthroplasty.   He presents today for follow-up.  Since his last visit he has done well from a cardiac standpoint.  He denies any symptoms concerning for angina, denies palpitations, dizziness, dyspnea, edema, PND, orthopnea, weight gain.  He is recovering from shoulder surgery, which he had approximately 3 weeks ago.  He is walking regularly for exercise, he has lost almost 10 pounds since his last visit.  Overall, he reports feeling well.    Home Medications    Current Outpatient Medications  Medication Sig Dispense Refill   aspirin-sod bicarb-citric  acid (ALKA-SELTZER) 325 MG TBEF tablet Take 650 mg by mouth every 6 (six) hours as needed (indigestion).     dapagliflozin propanediol (FARXIGA) 10 MG TABS tablet Take 1 tablet (10 mg total) by mouth daily. 90 tablet 2   docusate sodium (COLACE) 100 MG capsule Take 200 mg by mouth daily.     ELIQUIS 5 MG TABS tablet TAKE 1 TABLET BY MOUTH TWICE A DAY 60 tablet 5   ENTRESTO 24-26 MG TAKE 1 TABLET BY MOUTH TWICE A DAY 60 tablet 11   hydrOXYzine (ATARAX) 25 MG tablet Take 25 mg by mouth every 8 (eight) hours as needed for anxiety.     methadone (DOLOPHINE) 10 MG/ML solution Take 150 mg by mouth daily.     methocarbamol (ROBAXIN) 500 MG tablet Take 1 tablet (500 mg total) by mouth every 8 (eight) hours as needed for muscle spasms. 60 tablet 1   metoprolol tartrate (LOPRESSOR) 50 MG tablet TAKE 1 TABLET BY MOUTH TWICE A DAY 180 tablet 3   mirtazapine (REMERON) 15 MG tablet Take 15 mg by mouth at bedtime as needed (sleep).     Multiple Vitamin (MULTIVITAMIN) capsule Take 1 capsule by mouth daily.     oxyCODONE-acetaminophen (PERCOCET) 5-325 MG tablet Take 1 tablet by mouth every 4 (four) hours as needed for severe pain (pain score 7-10). 30 tablet 0   spironolactone (ALDACTONE) 25 MG tablet Take 0.5 tablets (12.5 mg total) by mouth daily. 45 tablet 3   testosterone cypionate (  DEPOTESTOSTERONE CYPIONATE) 200 MG/ML injection Inject 100 mg into the muscle once a week. Thursday     furosemide (LASIX) 40 MG tablet TAKE 1 TABLET BY MOUTH EVERY DAY (Patient not taking: Reported on 04/12/2023) 90 tablet 1   No current facility-administered medications for this visit.     Review of Systems    He denies chest pain, palpitations, dyspnea, pnd, orthopnea, n, v, dizziness, syncope, edema, weight gain, or early satiety. All other systems reviewed and are otherwise negative except as noted above.   Physical Exam    VS:  BP 124/80   Pulse 74   Ht 5\' 11"  (1.803 m)   Wt 219 lb (99.3 kg)   SpO2 96%   BMI 30.54  kg/m  GEN: Well nourished, well developed, in no acute distress. HEENT: normal. Neck: Supple, no JVD, carotid bruits, or masses. Cardiac: RRR, no murmurs, rubs, or gallops. No clubbing, cyanosis, edema.  Radials/DP/PT 2+ and equal bilaterally.  Respiratory:  Respirations regular and unlabored, clear to auscultation bilaterally. GI: Soft, nontender, nondistended, BS + x 4. MS: no deformity or atrophy. Skin: warm and dry, no rash. Neuro:  Strength and sensation are intact. Psych: Normal affect.  Accessory Clinical Findings    ECG personally reviewed by me today - EKG Interpretation Date/Time:  Wednesday April 12 2023 13:43:31 EDT Ventricular Rate:  74 PR Interval:  186 QRS Duration:  96 QT Interval:  390 QTC Calculation: 432 R Axis:   48  Text Interpretation: Sinus rhythm with Premature atrial complexes When compared with ECG of 26-Dec-2022 14:05, Premature ventricular complexes are no longer Present Premature atrial complexes are now Present Confirmed by Bernadene Person ([id number]) on 04/12/2023 1:46:03 PM  - no acute changes.   Lab Results  Component Value Date   WBC 8.8 03/16/2023   HGB 16.7 03/23/2023   HCT 50.6 03/23/2023   MCV 98.2 03/16/2023   PLT 185 03/16/2023   Lab Results  Component Value Date   CREATININE 0.87 03/23/2023   BUN 21 03/23/2023   NA 131 (L) 03/23/2023   K 4.3 03/23/2023   CL 98 03/23/2023   CO2 25 03/23/2023   Lab Results  Component Value Date   ALT 33 03/16/2023   AST 24 03/16/2023   ALKPHOS 61 03/16/2023   BILITOT 0.9 03/16/2023   Lab Results  Component Value Date   CHOL 151 01/15/2022   HDL 44 01/15/2022   LDLCALC 91 01/15/2022   TRIG 81 01/15/2022   CHOLHDL 3.4 01/15/2022    No results found for: "HGBA1C"  Assessment & Plan    1. Coronary artery calcification: Mild coronary artery calcification noted on prior CT. Stable with no anginal symptoms. No indication for ischemic evaluation. Statin intolerant.  No ASA in the setting of chronic  DOAC therapy.   2. Heart failure with recovered EF: Prior EF 35-40%. Repeat echocardiogram in 11/2022 showed EF 55 to 60%, normal LV function, no RWMA, mild concentric LVH, G1 DD, no significant valvular abnormalities, mild dilation of the ascending aorta measuring 42 mm. Euvolemic and well compensated on exam. Continue metoprolol, Entresto, spironolactone, Farxiga, and Lasix.   2. Paroxysmal atrial fibrillation/PVCs: S/p ablation in 07/2022. EKG today shows sinus rhythm with PAC. Following with EP. Continue metoprolol, Eliquis.     3. Mild dilation of ascending aorta: Measured 42 mm on echo in 11/2022. Plan for repeat echo vs CT chest/aorta for routine monitoring in 11/2023.    4. Hypertension: BP well controlled. Continue current antihypertensive regimen.  5. Hyperlipidemia: LDL was 91 in 12/2021. Statin intolerant.  Attempted to obtain copy of more recent labs from PCP, however, no labs were available.  Will repeat fasting lipids, CMET prior to next visit.    6. OSA: Adherent to CPAP.     7. Disposition: Follow-up per recall with Dr. Allyson Sabal in August 2025.      Joylene Grapes, NP 04/12/2023, 7:29 PM

## 2023-04-26 ENCOUNTER — Other Ambulatory Visit: Payer: Self-pay | Admitting: Cardiovascular Disease

## 2023-05-04 DIAGNOSIS — I5032 Chronic diastolic (congestive) heart failure: Secondary | ICD-10-CM | POA: Diagnosis not present

## 2023-05-04 DIAGNOSIS — I77810 Thoracic aortic ectasia: Secondary | ICD-10-CM | POA: Diagnosis not present

## 2023-05-04 DIAGNOSIS — Z4789 Encounter for other orthopedic aftercare: Secondary | ICD-10-CM | POA: Diagnosis not present

## 2023-05-04 DIAGNOSIS — I480 Paroxysmal atrial fibrillation: Secondary | ICD-10-CM | POA: Diagnosis not present

## 2023-05-04 DIAGNOSIS — I10 Essential (primary) hypertension: Secondary | ICD-10-CM | POA: Diagnosis not present

## 2023-05-04 DIAGNOSIS — I2510 Atherosclerotic heart disease of native coronary artery without angina pectoris: Secondary | ICD-10-CM | POA: Diagnosis not present

## 2023-05-05 LAB — COMPREHENSIVE METABOLIC PANEL WITH GFR
ALT: 27 IU/L (ref 0–44)
AST: 22 IU/L (ref 0–40)
Albumin: 4.6 g/dL (ref 3.8–4.8)
Alkaline Phosphatase: 129 IU/L — ABNORMAL HIGH (ref 44–121)
BUN/Creatinine Ratio: 22 (ref 10–24)
BUN: 23 mg/dL (ref 8–27)
Bilirubin Total: 0.7 mg/dL (ref 0.0–1.2)
CO2: 24 mmol/L (ref 20–29)
Calcium: 9.9 mg/dL (ref 8.6–10.2)
Chloride: 97 mmol/L (ref 96–106)
Creatinine, Ser: 1.03 mg/dL (ref 0.76–1.27)
Globulin, Total: 2.2 g/dL (ref 1.5–4.5)
Glucose: 94 mg/dL (ref 70–99)
Potassium: 4.6 mmol/L (ref 3.5–5.2)
Sodium: 139 mmol/L (ref 134–144)
Total Protein: 6.8 g/dL (ref 6.0–8.5)
eGFR: 77 mL/min/{1.73_m2} (ref >59)

## 2023-05-05 LAB — LIPID PANEL
Chol/HDL Ratio: 3.8 ratio (ref 0.0–5.0)
Cholesterol, Total: 173 mg/dL (ref 100–199)
HDL: 45 mg/dL (ref >39)
LDL Chol Calc (NIH): 113 mg/dL — ABNORMAL HIGH (ref 0–99)
Triglycerides: 80 mg/dL (ref 0–149)
VLDL Cholesterol Cal: 15 mg/dL (ref 5–40)

## 2023-05-10 ENCOUNTER — Telehealth: Payer: Self-pay

## 2023-05-10 NOTE — Telephone Encounter (Signed)
 Spoke with pts spouse. She was notified of pts lab results and recommendations for pt to see pharm-D to discuss alternative lipid lowering therapy. Pts spouse will have pt call back after they discuss the recent lab results.

## 2023-05-25 DIAGNOSIS — M25612 Stiffness of left shoulder, not elsewhere classified: Secondary | ICD-10-CM | POA: Diagnosis not present

## 2023-05-25 DIAGNOSIS — M25512 Pain in left shoulder: Secondary | ICD-10-CM | POA: Diagnosis not present

## 2023-06-01 DIAGNOSIS — M25612 Stiffness of left shoulder, not elsewhere classified: Secondary | ICD-10-CM | POA: Diagnosis not present

## 2023-06-01 DIAGNOSIS — M25512 Pain in left shoulder: Secondary | ICD-10-CM | POA: Diagnosis not present

## 2023-07-20 DIAGNOSIS — L218 Other seborrheic dermatitis: Secondary | ICD-10-CM | POA: Diagnosis not present

## 2023-07-20 DIAGNOSIS — Z85828 Personal history of other malignant neoplasm of skin: Secondary | ICD-10-CM | POA: Diagnosis not present

## 2023-07-20 DIAGNOSIS — D2261 Melanocytic nevi of right upper limb, including shoulder: Secondary | ICD-10-CM | POA: Diagnosis not present

## 2023-07-20 DIAGNOSIS — D225 Melanocytic nevi of trunk: Secondary | ICD-10-CM | POA: Diagnosis not present

## 2023-07-20 DIAGNOSIS — D1801 Hemangioma of skin and subcutaneous tissue: Secondary | ICD-10-CM | POA: Diagnosis not present

## 2023-07-20 DIAGNOSIS — L820 Inflamed seborrheic keratosis: Secondary | ICD-10-CM | POA: Diagnosis not present

## 2023-07-20 DIAGNOSIS — L814 Other melanin hyperpigmentation: Secondary | ICD-10-CM | POA: Diagnosis not present

## 2023-07-20 DIAGNOSIS — D2271 Melanocytic nevi of right lower limb, including hip: Secondary | ICD-10-CM | POA: Diagnosis not present

## 2023-07-20 DIAGNOSIS — L821 Other seborrheic keratosis: Secondary | ICD-10-CM | POA: Diagnosis not present

## 2023-07-20 DIAGNOSIS — D485 Neoplasm of uncertain behavior of skin: Secondary | ICD-10-CM | POA: Diagnosis not present

## 2023-07-20 DIAGNOSIS — L570 Actinic keratosis: Secondary | ICD-10-CM | POA: Diagnosis not present

## 2023-07-23 ENCOUNTER — Other Ambulatory Visit (HOSPITAL_BASED_OUTPATIENT_CLINIC_OR_DEPARTMENT_OTHER): Payer: Self-pay | Admitting: Cardiovascular Disease

## 2023-07-23 DIAGNOSIS — I10 Essential (primary) hypertension: Secondary | ICD-10-CM

## 2023-07-23 DIAGNOSIS — I5022 Chronic systolic (congestive) heart failure: Secondary | ICD-10-CM

## 2023-07-23 DIAGNOSIS — I480 Paroxysmal atrial fibrillation: Secondary | ICD-10-CM

## 2023-07-23 DIAGNOSIS — D6859 Other primary thrombophilia: Secondary | ICD-10-CM

## 2023-07-27 ENCOUNTER — Ambulatory Visit (HOSPITAL_BASED_OUTPATIENT_CLINIC_OR_DEPARTMENT_OTHER)

## 2023-07-27 DIAGNOSIS — I7121 Aneurysm of the ascending aorta, without rupture: Secondary | ICD-10-CM | POA: Diagnosis not present

## 2023-07-27 LAB — ECHOCARDIOGRAM COMPLETE
Area-P 1/2: 3.77 cm2
S' Lateral: 3.65 cm

## 2023-07-31 ENCOUNTER — Ambulatory Visit (HOSPITAL_BASED_OUTPATIENT_CLINIC_OR_DEPARTMENT_OTHER): Payer: Self-pay | Admitting: Family

## 2023-07-31 DIAGNOSIS — I7123 Aneurysm of the descending thoracic aorta, without rupture: Secondary | ICD-10-CM

## 2023-08-15 ENCOUNTER — Other Ambulatory Visit (HOSPITAL_BASED_OUTPATIENT_CLINIC_OR_DEPARTMENT_OTHER): Payer: Self-pay | Admitting: Cardiovascular Disease

## 2023-08-15 DIAGNOSIS — I480 Paroxysmal atrial fibrillation: Secondary | ICD-10-CM

## 2023-08-15 NOTE — Telephone Encounter (Signed)
 Prescription refill request for Eliquis  received. Indication:afib Last office visit:3/25 Scr:1.03  4/25 Age: 73 Weight:99.3  kg  Prescription refilled

## 2023-08-16 DIAGNOSIS — K130 Diseases of lips: Secondary | ICD-10-CM | POA: Diagnosis not present

## 2023-09-20 ENCOUNTER — Ambulatory Visit: Attending: Cardiovascular Disease | Admitting: Cardiovascular Disease

## 2023-09-20 ENCOUNTER — Encounter: Payer: Self-pay | Admitting: Cardiovascular Disease

## 2023-09-20 ENCOUNTER — Ambulatory Visit: Attending: Cardiology | Admitting: Cardiovascular Disease

## 2023-09-20 VITALS — BP 130/78 | HR 78 | Ht 71.0 in | Wt 228.0 lb

## 2023-09-20 VITALS — BP 120/86 | HR 76 | Ht 71.0 in | Wt 224.0 lb

## 2023-09-20 DIAGNOSIS — Z720 Tobacco use: Secondary | ICD-10-CM | POA: Diagnosis not present

## 2023-09-20 DIAGNOSIS — I7121 Aneurysm of the ascending aorta, without rupture: Secondary | ICD-10-CM | POA: Diagnosis not present

## 2023-09-20 DIAGNOSIS — I5023 Acute on chronic systolic (congestive) heart failure: Secondary | ICD-10-CM

## 2023-09-20 DIAGNOSIS — G4733 Obstructive sleep apnea (adult) (pediatric): Secondary | ICD-10-CM | POA: Diagnosis not present

## 2023-09-20 DIAGNOSIS — I4891 Unspecified atrial fibrillation: Secondary | ICD-10-CM

## 2023-09-20 DIAGNOSIS — I4819 Other persistent atrial fibrillation: Secondary | ICD-10-CM | POA: Diagnosis not present

## 2023-09-20 DIAGNOSIS — I10 Essential (primary) hypertension: Secondary | ICD-10-CM

## 2023-09-20 DIAGNOSIS — I519 Heart disease, unspecified: Secondary | ICD-10-CM | POA: Diagnosis not present

## 2023-09-20 DIAGNOSIS — I5022 Chronic systolic (congestive) heart failure: Secondary | ICD-10-CM

## 2023-09-20 DIAGNOSIS — E782 Mixed hyperlipidemia: Secondary | ICD-10-CM

## 2023-09-20 NOTE — Progress Notes (Signed)
 Electrophysiology Office Note:    Date:  09/20/2023   ID:  DARVELL, MONTEFORTE 1950-03-16, MRN [id number]  PCP:  Katina Pfeiffer, PA-C   [facility] Providers Cardiologist:  Dorn Lesches, MD Electrophysiologist:  Eulas FORBES Furbish, MD     Referring MD: Katina Pfeiffer, PA-C   History of Present Illness:    Frank Carpenter is a 73 y.o. male with a hx listed below, significant for CHFrEF, OSA, referred for arrhythmia management.  He was diagnosed with atrial fibrillation at the time of an admission for CHF in December 2023.  His EF was newly depressed at 40 to 45%, attributed to rapid ventricular rates.  He was started on Eliquis  and metoprolol  for rate control.  Cardioversion was ordered, but he was admitted again with acute systolic heart failure prior to eventually undergoing cardioversion on January 24, 2022.  He remained in sinus rhythm for a few weeks, but his Apple Watch detected recurrence of atrial fibrillation in mid February 2024.  Repeat cardioversion was attempted on March 6 but unsuccessful.  He was seen in atrial fibrillation clinic on March 21, symptomatic and rate controlled atrial fibrillation.  He feels very fatigued in atrial fibrillation.  He underwent atrial fibrillation ablation on August 04, 2022.  He has felt much better since the ablation.  His energy level and shortness of breath have improved significantly.  He does occasionally notice irregular rhythms -- he has pulsatile tinnitus.  These appear to correlate with PVCs and is qualitatively different than when he had A-fib.    EKGs/Labs/Other Studies Reviewed Today:    Echocardiogram:  TTE 04/18/2022  1. Left ventricular ejection fraction, by estimation, is 35 to 40%. The  left ventricle has moderately decreased function. The left ventricle demonstrates global hypokinesis. There is mild left ventricular hypertrophy. Left ventricular diastolic parameters are indeterminate.   2. Right ventricular  systolic function is mildly reduced. The right ventricular size is normal. Tricuspid regurgitation signal is inadequate for assessing PA pressure.   3. Left atrial size was severely dilated.   4. Right atrial size was mildly dilated.   5. The mitral valve is normal in structure. No evidence of mitral valve regurgitation. No evidence of mitral stenosis.   6. The aortic valve was not well visualized. Aortic valve regurgitation is not visualized. No aortic stenosis is present.   7. Aortic dilatation noted. There is dilatation of the ascending aorta,  measuring 42 mm.   8. The inferior vena cava is normal in size with greater than 50%  respiratory variability, suggesting right atrial pressure of 3 mmHg.      TTE 11/08/2022 EF 55 to 60%.  Atria are normal in size  Monitors:   Stress testing:   Advanced imaging:    EKG:   EKG Interpretation Date/Time:  Wednesday September 20 2023 14:57:51 EDT Ventricular Rate:  77 PR Interval:  144 QRS Duration:  98 QT Interval:  386 QTC Calculation: 436 R Axis:   69  Text Interpretation: Sinus rhythm with occasional Premature ventricular complexes When compared with ECG of 12-Apr-2023 13:43, Premature ventricular complexes are now Present Premature atrial complexes are no longer Present Confirmed by Furbish Eulas [phone]) on 09/20/2023 3:02:53 PM     Recent Labs: 03/16/2023: Platelets 185 03/23/2023: Hemoglobin 16.7 05/04/2023: ALT 27; BUN 23; Creatinine, Ser 1.03; Potassium 4.6; Sodium 139     Physical Exam:    VS:  BP 120/86 (BP Location: Right Arm, Patient Position: Sitting, Cuff Size: Large)  Pulse 76   Ht 5' 11 (1.803 m)   Wt 224 lb (101.6 kg)   SpO2 98%   BMI 31.24 kg/m     Wt Readings from Last 3 Encounters:  09/20/23 224 lb (101.6 kg)  04/12/23 219 lb (99.3 kg)  03/22/23 226 lb (102.5 kg)     GEN:  Well nourished, well developed in no acute distress CARDIAC: RRR, no murmurs, rubs, gallops RESPIRATORY:  Normal work of  breathing MUSCULOSKELETAL: no edema    ASSESSMENT & PLAN:    Atrial fibrillation Persistent, symptomatic despite rate control Maintaining sinus rhythm after ablation -- feels great Left atrium was severely dilated (5.2cm) but has remodeled to normal dimensions He is not a good candidate for most antiarrhythmic drugs due to concurrent methadone  use.   Continue apixaban   CHFrEF EF 40 to 45% Continue Entresto  24-26, spironolactone  25, metoprolol , Farxiga  10  OSA Compliance with CPAP encouraged  Chronic methadone  use QT is not prolonged  Secondary hypercoagulable state Continue apixaban  for CHA2DS2-VASc score of 3    Frequent PVCs Minimal symptoms, normal EF No strong indication for intervention at this time Continue metoprolol           Medication Adjustments/Labs and Tests Ordered: Current medicines are reviewed at length with the patient today.  Concerns regarding medicines are outlined above.  Orders Placed This Encounter  Procedures   EKG 12-Lead   No orders of the defined types were placed in this encounter.    Signed, Eulas FORBES Furbish, MD  09/20/2023 3:09 PM    [facility]

## 2023-09-20 NOTE — Assessment & Plan Note (Signed)
 History of hyperlipidemia intolerant to statin therapy with lipid profile performed 05/04/2023 revealing total cholesterol 173, LDL of 113 and HDL of 45.  He does have a coronary calcium score 0.  This is acceptable for primary prevention we talked about lifestyle modification.

## 2023-09-20 NOTE — Progress Notes (Signed)
 09/20/2023 Frank Carpenter   Dec 25, 1950  [id number]  Primary Physician Frank Pfeiffer, PA-C Primary Cardiologist: Frank JINNY Lesches MD Frank Carpenter, [name]  HPI:  Frank Carpenter is a 73 y.o.  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  He was referred by Carpenter Katina, PA-C for evaluation of progressive dyspnea.  I last saw him in the office 09/14/2022.  His cardiovascular risk factor profile is notable for discontinue tobacco abuse having smoked 20 pack years and stopped 5 to 6 years ago.  He has treated hypertension, untreated hyperlipidemia because of statin intolerance.  There is no family history.  Is never had an attack or stroke.  He denies chest pain but complains of increasing dyspnea exertion over the last 5 to 6 months.  He  was admitted over Christmas 2023 with A-fib with RVR and heart failure.  He was anticoagulated and rate controlled.  Since that time he had 3 cardioversions and several admissions for heart failure.  He did have a coronary calcium score performed 06/26/2019 which was 0.  5 7 his 2D echo performed 04/18/2022 revealed EF of 35 to 40% with severe left atrial dilatation.  Interestingly, echo performed 06/26/2019 showed normal LV function with only mild left atrial dilatation.  He has seen Dr. Nancey in the office in early April who had arranged for him to undergo A-fib ablation.  He underwent successful cardioversion by Dr. Sheena on amiodarone  05/12/2022 and he remains in sinus rhythm today with PVCs   He underwent successful A-fib ablation by Dr. Nancey 08/04/2022.  He has had dramatic clinical improvement.  He has more energy.  He is working out.  He denies chest pain or shortness of breath.  He remains in sinus rhythm on Eliquis .  Since I saw him a year ago he remained clinically stable.  He is maintained sinus rhythm.  He works out at [employer] 3 days a week walking 2 miles.   His most recent EF by 2D echo 07/27/2023 with 60 to 65% which represents a significant increase increase from his EF of 35 to 40% by echo 1 year ago probably related to maintenance of sinus rhythm.   Current Meds  Medication Sig   aspirin -sod bicarb-citric acid (ALKA-SELTZER) 325 MG TBEF tablet Take 650 mg by mouth every 6 (six) hours as needed (indigestion).   dapagliflozin  propanediol (FARXIGA ) 10 MG TABS tablet Take 1 tablet (10 mg total) by mouth daily.   docusate sodium  (COLACE) 100 MG capsule Take 200 mg by mouth daily.   ELIQUIS  5 MG TABS tablet TAKE 1 TABLET BY MOUTH TWICE A DAY   ENTRESTO  24-26 MG TAKE 1 TABLET BY MOUTH TWICE A DAY   hydrOXYzine  (ATARAX ) 25 MG tablet Take 25 mg by mouth every 8 (eight) hours as needed for anxiety.   methadone  (DOLOPHINE ) 10 MG/ML solution Take 150 mg by mouth daily.   methocarbamol  (ROBAXIN ) 500 MG tablet Take 1 tablet (500 mg total) by mouth every 8 (eight) hours as needed for muscle spasms.   metoprolol  tartrate (LOPRESSOR ) 50 MG tablet TAKE 1 TABLET BY MOUTH TWICE A DAY   mirtazapine  (REMERON ) 15 MG tablet Take 15 mg by mouth at bedtime as needed (sleep).   Multiple Vitamin (MULTIVITAMIN) capsule Take 1 capsule by mouth daily.   oxyCODONE -acetaminophen  (PERCOCET) 5-325 MG tablet Take 1 tablet by mouth every 4 (four) hours as needed for severe  pain (pain score 7-10).   spironolactone  (ALDACTONE ) 25 MG tablet TAKE 1/2 TABLET BY MOUTH EVERY DAY   testosterone  cypionate (DEPOTESTOSTERONE CYPIONATE) 200 MG/ML injection Inject 100 mg into the muscle once a week. Thursday     Allergies  Allergen Reactions   Ezetimibe      Sick feeling   Statins     Fatigue    Social History   Socioeconomic History   Marital status: Married    Spouse name: Not on file   Number of children: Not on file   Years of education: Not on file   Highest education level: Not on file  Occupational History   Occupation: retired  Tobacco Use   Smoking status: Former     Current packs/day: 0.00    Average packs/day: 1 pack/day for 15.0 years (15.0 ttl pk-yrs)    Types: Cigarettes    Start date: 20    Quit date: 2004    Years since quitting: 21.6    Passive exposure: Current   Smokeless tobacco: Never  Vaping Use   Vaping status: Never Used  Substance and Sexual Activity   Alcohol use: Yes    Alcohol/week: 2.0 standard drinks of alcohol    Types: 2 Cans of beer per week    Comment: rare   Drug use: Not Currently    Types: Marijuana    Comment: hydrocodone  prescribed for pain   Sexual activity: Not Currently  Other Topics Concern   Not on file  Social History Narrative   Not on file   Social Drivers of Health   Financial Resource Strain: Not on file  Food Insecurity: No Food Insecurity (03/22/2023)   Hunger Vital Sign    Worried About Running Out of Food in the Last Year: Never true    Ran Out of Food in the Last Year: Never true  Transportation Needs: No Transportation Needs (03/22/2023)   PRAPARE - Administrator, Civil Service (Medical): No    Lack of Transportation (Non-Medical): No  Physical Activity: Not on file  Stress: Not on file  Social Connections: Socially Integrated (03/22/2023)   Social Connection and Isolation Panel    Frequency of Communication with Friends and Family: More than three times a week    Frequency of Social Gatherings with Friends and Family: Twice a week    Attends Religious Services: 1 to 4 times per year    Active Member of [employer] or Organizations: No    Attends Engineer, structural: 1 to 4 times per year    Marital Status: Married  Catering manager Violence: Not At Risk (03/22/2023)   Humiliation, Afraid, Rape, and Kick questionnaire    Fear of Current or Ex-Partner: No    Emotionally Abused: No    Physically Abused: No    Sexually Abused: No     Review of Systems: General: negative for chills, fever, night sweats or weight changes.  Cardiovascular: negative for chest pain, dyspnea  on exertion, edema, orthopnea, palpitations, paroxysmal nocturnal dyspnea or shortness of breath Dermatological: negative for rash Respiratory: negative for cough or wheezing Urologic: negative for hematuria Abdominal: negative for nausea, vomiting, diarrhea, bright red blood per rectum, melena, or hematemesis Neurologic: negative for visual changes, syncope, or dizziness All other systems reviewed and are otherwise negative except as noted above.    Blood pressure 130/78, pulse 78, height 5' 11 (1.803 m), weight 228 lb (103.4 kg), SpO2 95%.  General appearance: alert and no distress Neck: no adenopathy,  no carotid bruit, no JVD, supple, symmetrical, trachea midline, and thyroid not enlarged, symmetric, no tenderness/mass/nodules Lungs: clear to auscultation bilaterally Heart: regular rate and rhythm, S1, S2 normal, no murmur, click, rub or gallop Extremities: extremities normal, atraumatic, no cyanosis or edema Pulses: 2+ and symmetric Skin: Skin color, texture, turgor normal. No rashes or lesions Neurologic: Grossly normal  EKG not performed today      ASSESSMENT AND PLAN:   Tobacco abuse Remote tobacco abuse having smoked 20 pack years  Essential hypertension History of essential hypertension with blood pressure measured today at 120/86.  He is on Entresto , metoprolol  and spironolactone .  Hyperlipidemia History of hyperlipidemia intolerant to statin therapy with lipid profile performed 05/04/2023 revealing total cholesterol 173, LDL of 113 and HDL of 45.  He does have a coronary calcium score 0.  This is acceptable for primary prevention we talked about lifestyle modification.  OSA (obstructive sleep apnea) History of obstructive sleep apnea on CPAP.  He is having trouble with his CPAP mask and I advised him to go back to his provider at [facility] to have this addressed.  Thoracic aortic aneurysm [facility]) History of thoracic aortic aneurysm measuring 42 mm by echo last checked  07/27/2023.  This will be checked on every other year basis.  Atrial fibrillation with RVR (HCC) History of PAF status post multiple cardioversions in the past and ultimately requiring A-fib ablation by Dr. Nancey 08/04/2022.  He was on amiodarone  in the past which was discontinued.  He is maintained sinus rhythm and feels clinically improved.  He is on Eliquis  oral anticoagulation.  Acute on chronic systolic (congestive) heart failure (HCC) History of LV dysfunction with an EF in the 35 to 40% range by echo 04/18/2022.  He is on GDMT.  Since maintaining sinus rhythm he feels clinically improved and his most recent echo performed 07/27/2023 revealed increase in his EF up to 60 to 65% without valvular abnormalities.     Frank DOROTHA Lesches MD FACP,FACC,FAHA, [facility] 09/20/2023 3:34 PM

## 2023-09-20 NOTE — Assessment & Plan Note (Signed)
 Remote tobacco abuse having smoked 20 pack years

## 2023-09-20 NOTE — Patient Instructions (Signed)

## 2023-09-20 NOTE — Assessment & Plan Note (Signed)
 History of essential hypertension with blood pressure measured today at 120/86.  He is on Entresto , metoprolol  and spironolactone .

## 2023-09-20 NOTE — Assessment & Plan Note (Signed)
 History of thoracic aortic aneurysm measuring 42 mm by echo last checked 07/27/2023.  This will be checked on every other year basis.

## 2023-09-20 NOTE — Assessment & Plan Note (Signed)
 History of PAF status post multiple cardioversions in the past and ultimately requiring A-fib ablation by Dr. Nancey 08/04/2022.  He was on amiodarone  in the past which was discontinued.  He is maintained sinus rhythm and feels clinically improved.  He is on Eliquis  oral anticoagulation.

## 2023-09-20 NOTE — Assessment & Plan Note (Signed)
 History of obstructive sleep apnea on CPAP.  He is having trouble with his CPAP mask and I advised him to go back to his provider at [facility] to have this addressed.

## 2023-09-20 NOTE — Patient Instructions (Signed)

## 2023-09-20 NOTE — Assessment & Plan Note (Signed)
 History of LV dysfunction with an EF in the 35 to 40% range by echo 04/18/2022.  He is on GDMT.  Since maintaining sinus rhythm he feels clinically improved and his most recent echo performed 07/27/2023 revealed increase in his EF up to 60 to 65% without valvular abnormalities.

## 2023-12-08 LAB — LAB REPORT - SCANNED
A1c: 5.7
EGFR: 80

## 2023-12-13 ENCOUNTER — Ambulatory Visit: Payer: Self-pay | Admitting: Cardiovascular Disease

## 2023-12-15 ENCOUNTER — Other Ambulatory Visit: Payer: Self-pay | Admitting: Cardiovascular Disease

## 2024-01-17 ENCOUNTER — Other Ambulatory Visit: Payer: Self-pay | Admitting: Cardiovascular Disease

## 2024-02-08 ENCOUNTER — Other Ambulatory Visit (HOSPITAL_BASED_OUTPATIENT_CLINIC_OR_DEPARTMENT_OTHER): Payer: Self-pay | Admitting: Cardiovascular Disease

## 2024-02-08 DIAGNOSIS — I480 Paroxysmal atrial fibrillation: Secondary | ICD-10-CM

## 2024-02-26 ENCOUNTER — Other Ambulatory Visit: Payer: Self-pay | Admitting: Cardiovascular Disease
